# Patient Record
Sex: Female | Born: 1955
Health system: Southern US, Community
[De-identification: ages and names within clinical notes are randomized; demographics above are authoritative.]

## PROBLEM LIST (undated history)

## (undated) DIAGNOSIS — G4730 Sleep apnea, unspecified: Secondary | ICD-10-CM

## (undated) DIAGNOSIS — G4733 Obstructive sleep apnea (adult) (pediatric): Secondary | ICD-10-CM

## (undated) DIAGNOSIS — F419 Anxiety disorder, unspecified: Secondary | ICD-10-CM

## (undated) DIAGNOSIS — Z9889 Other specified postprocedural states: Secondary | ICD-10-CM

## (undated) DIAGNOSIS — R112 Nausea with vomiting, unspecified: Secondary | ICD-10-CM

## (undated) DIAGNOSIS — I639 Cerebral infarction, unspecified: Secondary | ICD-10-CM

## (undated) DIAGNOSIS — F32A Depression, unspecified: Secondary | ICD-10-CM

## (undated) DIAGNOSIS — T7840XA Allergy, unspecified, initial encounter: Secondary | ICD-10-CM

## (undated) DIAGNOSIS — K219 Gastro-esophageal reflux disease without esophagitis: Secondary | ICD-10-CM

## (undated) DIAGNOSIS — R413 Other amnesia: Secondary | ICD-10-CM

## (undated) DIAGNOSIS — R569 Unspecified convulsions: Secondary | ICD-10-CM

## (undated) DIAGNOSIS — Z5189 Encounter for other specified aftercare: Secondary | ICD-10-CM

## (undated) DIAGNOSIS — M1990 Unspecified osteoarthritis, unspecified site: Secondary | ICD-10-CM

## (undated) DIAGNOSIS — E785 Hyperlipidemia, unspecified: Secondary | ICD-10-CM

## (undated) DIAGNOSIS — K759 Inflammatory liver disease, unspecified: Secondary | ICD-10-CM

## (undated) HISTORY — DX: Other amnesia: R41.3

## (undated) HISTORY — DX: Allergy, unspecified, initial encounter: T78.40XA

## (undated) HISTORY — DX: Obstructive sleep apnea (adult) (pediatric): G47.33

## (undated) HISTORY — PX: JOINT REPLACEMENT: SHX530

## (undated) HISTORY — PX: COLONOSCOPY: SHX174

## (undated) HISTORY — PX: BREAST EXCISIONAL BIOPSY: SUR124

## (undated) HISTORY — PX: ARTHROSCOPIC REPAIR ACL: SUR80

## (undated) HISTORY — PX: APPENDECTOMY: SHX54

## (undated) HISTORY — PX: BREAST LUMPECTOMY: SHX2

## (undated) HISTORY — DX: Cerebral infarction, unspecified: I63.9

## (undated) HISTORY — PX: UPPER GASTROINTESTINAL ENDOSCOPY: SHX188

## (undated) HISTORY — DX: Encounter for other specified aftercare: Z51.89

## (undated) HISTORY — DX: Depression, unspecified: F32.A

## (undated) HISTORY — DX: Hyperlipidemia, unspecified: E78.5

## (undated) HISTORY — DX: Sleep apnea, unspecified: G47.30

## (undated) HISTORY — PX: POLYPECTOMY: SHX149

---
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
## 1987-09-09 HISTORY — PX: CHOLECYSTECTOMY: SHX55

## 1999-04-09 HISTORY — PX: ABDOMINAL HYSTERECTOMY: SHX81

## 2005-04-15 ENCOUNTER — Emergency Department: Payer: Self-pay | Admitting: Emergency Medicine

## 2006-01-26 ENCOUNTER — Emergency Department: Payer: Self-pay | Admitting: General Practice

## 2006-01-26 ENCOUNTER — Other Ambulatory Visit: Payer: Self-pay

## 2006-10-09 HISTORY — PX: BREAST EXCISIONAL BIOPSY: SUR124

## 2009-07-08 ENCOUNTER — Ambulatory Visit: Payer: Self-pay | Admitting: Internal Medicine

## 2009-07-13 ENCOUNTER — Ambulatory Visit: Payer: Self-pay | Admitting: Internal Medicine

## 2009-07-23 ENCOUNTER — Ambulatory Visit: Payer: Self-pay | Admitting: Surgery

## 2011-07-26 ENCOUNTER — Ambulatory Visit: Payer: Self-pay | Admitting: Internal Medicine

## 2011-07-26 ENCOUNTER — Ambulatory Visit (INDEPENDENT_AMBULATORY_CARE_PROVIDER_SITE_OTHER): Payer: BC Managed Care – PPO | Admitting: Internal Medicine

## 2011-07-26 ENCOUNTER — Encounter: Payer: Self-pay | Admitting: Internal Medicine

## 2011-07-26 VITALS — BP 132/100 | HR 93 | Temp 98.1°F | Resp 20 | Ht 64.0 in

## 2011-07-26 DIAGNOSIS — F32A Depression, unspecified: Secondary | ICD-10-CM

## 2011-07-26 DIAGNOSIS — F3341 Major depressive disorder, recurrent, in partial remission: Secondary | ICD-10-CM | POA: Insufficient documentation

## 2011-07-26 DIAGNOSIS — M25559 Pain in unspecified hip: Secondary | ICD-10-CM

## 2011-07-26 DIAGNOSIS — F329 Major depressive disorder, single episode, unspecified: Secondary | ICD-10-CM

## 2011-07-26 DIAGNOSIS — M25552 Pain in left hip: Secondary | ICD-10-CM

## 2011-07-26 MED ORDER — TRAMADOL HCL 50 MG PO TABS: 50.0000 mg | ORAL_TABLET | Freq: Four times a day (QID) | ORAL | Status: DC | PRN

## 2011-07-26 MED ORDER — BUPROPION HCL ER (SR) 150 MG PO TB12: 150.0000 mg | ORAL_TABLET | Freq: Two times a day (BID) | ORAL | Status: DC

## 2011-07-26 MED ORDER — HYDROCODONE-ACETAMINOPHEN 5-500 MG PO TABS: 2.0000 | ORAL_TABLET | Freq: Four times a day (QID) | ORAL | Status: DC | PRN

## 2011-07-26 NOTE — Progress Notes (Signed)
  Subjective:    Patient ID: [name], female    DOB: 12-22-1955, 55 y.o.   MRN: [id number]  HPI [name] is a 55 year old female who presents for an acute visit complaining of left hip pain. She reports that the pain started a few weeks ago. It has gotten progressively worse. It is located in the deep within the left hip. It radiates down the front of her left leg. She also occasionally has pain in her left lower back. She denies any weakness in her leg. She denies any numbness. She denies any known injury to her hip. She denies any other arthralgias. She denies any fever or chills.  Outpatient Encounter Prescriptions as of 07/26/2011  Medication Sig Dispense Refill  . buPROPion (WELLBUTRIN SR) 150 MG 12 hr tablet Take 1 tablet (150 mg total) by mouth 2 (two) times daily.  60 tablet  6     Review of Systems  Constitutional: Negative for fever, chills, appetite change, fatigue and unexpected weight change.  Eyes: Negative for visual disturbance.  Respiratory: Negative for shortness of breath.   Cardiovascular: Negative for chest pain.  Gastrointestinal: Positive for vomiting.  Musculoskeletal: Positive for myalgias, arthralgias and gait problem.  Skin: Negative for color change and rash.   BP 132/100  Pulse 93  Temp(Src) 98.1 F (36.7 C) (Oral)  Resp 20  Ht 5\' 4"  (1.626 m)  SpO2 96%     Objective:   Physical Exam  Constitutional: She appears well-developed and well-nourished.  HENT:  Head: Normocephalic and atraumatic.  Right Ear: External ear normal.  Left Ear: External ear normal.  Nose: Nose normal.  Eyes: Conjunctivae and EOM are normal.  Pulmonary/Chest: Effort normal.  Musculoskeletal:       Left hip: She exhibits decreased range of motion and tenderness. She exhibits no swelling, no crepitus and no deformity.          Assessment & Plan:  1. Left hip pain -question arthritis in the left hip. Will start tramadol as needed for pain during the day and Vicodin as  needed at night. We'll set her up for a plain film of her left hip.  Addendum. Left hip plain film shows narrowing of the hip joint consistent with arthritis. I have set patient up with orthopedics this afternoon for further evaluation and possible steroid injection.

## 2011-07-27 ENCOUNTER — Other Ambulatory Visit: Payer: Self-pay | Admitting: Internal Medicine

## 2011-07-27 DIAGNOSIS — F329 Major depressive disorder, single episode, unspecified: Secondary | ICD-10-CM

## 2011-07-27 DIAGNOSIS — F32A Depression, unspecified: Secondary | ICD-10-CM

## 2011-07-28 MED ORDER — BUPROPION HCL ER (SR) 150 MG PO TB12: 150.0000 mg | ORAL_TABLET | Freq: Two times a day (BID) | ORAL | Status: DC

## 2011-08-01 ENCOUNTER — Other Ambulatory Visit: Payer: Self-pay | Admitting: Internal Medicine

## 2011-08-07 NOTE — Telephone Encounter (Signed)
Opened in error

## 2011-08-16 ENCOUNTER — Encounter: Payer: Self-pay | Admitting: Internal Medicine

## 2011-10-23 ENCOUNTER — Telehealth: Payer: Self-pay | Admitting: Internal Medicine

## 2011-10-23 DIAGNOSIS — M25552 Pain in left hip: Secondary | ICD-10-CM

## 2011-10-23 MED ORDER — HYDROCODONE-ACETAMINOPHEN 5-500 MG PO TABS: 2.0000 | ORAL_TABLET | Freq: Four times a day (QID) | ORAL | Status: DC | PRN

## 2011-10-23 NOTE — Telephone Encounter (Signed)
Patient is needing a refill on hydrocodone 5-500 mg. Patient is leaving to go out of town would like a refill today.

## 2011-10-23 NOTE — Telephone Encounter (Signed)
Rx phoned to pharmacy.  

## 2011-10-23 NOTE — Telephone Encounter (Signed)
Fine to refill 

## 2011-12-22 ENCOUNTER — Ambulatory Visit (INDEPENDENT_AMBULATORY_CARE_PROVIDER_SITE_OTHER): Payer: BC Managed Care – PPO | Admitting: Internal Medicine

## 2011-12-22 ENCOUNTER — Encounter: Payer: Self-pay | Admitting: Internal Medicine

## 2011-12-22 VITALS — BP 132/98 | HR 127 | Temp 98.1°F | Ht 64.0 in | Wt 290.0 lb

## 2011-12-22 DIAGNOSIS — R5383 Other fatigue: Secondary | ICD-10-CM

## 2011-12-22 DIAGNOSIS — R110 Nausea: Secondary | ICD-10-CM

## 2011-12-22 DIAGNOSIS — R5381 Other malaise: Secondary | ICD-10-CM

## 2011-12-22 DIAGNOSIS — M1990 Unspecified osteoarthritis, unspecified site: Secondary | ICD-10-CM

## 2011-12-22 LAB — COMPREHENSIVE METABOLIC PANEL
ALT: 23 U/L (ref 0–35)
AST: 22 U/L (ref 0–37)
Alkaline Phosphatase: 83 U/L (ref 39–117)
Sodium: 141 mEq/L (ref 135–145)
Total Bilirubin: 0.7 mg/dL (ref 0.3–1.2)
Total Protein: 7.1 g/dL (ref 6.0–8.3)

## 2011-12-22 LAB — CBC WITH DIFFERENTIAL/PLATELET
Basophils Absolute: 0.1 10*3/uL (ref 0.0–0.1)
HCT: 39.5 % (ref 36.0–46.0)
Hemoglobin: 13.4 g/dL (ref 12.0–15.0)
Lymphs Abs: 2.4 10*3/uL (ref 0.7–4.0)
Monocytes Relative: 6.8 % (ref 3.0–12.0)
Neutro Abs: 5.1 10*3/uL (ref 1.4–7.7)
RDW: 13.2 % (ref 11.5–14.6)

## 2011-12-22 MED ORDER — PANTOPRAZOLE SODIUM 40 MG PO TBEC: 40.0000 mg | DELAYED_RELEASE_TABLET | Freq: Every day | ORAL | Status: DC

## 2011-12-22 MED ORDER — ONDANSETRON 8 MG PO TBDP: 8.0000 mg | ORAL_TABLET | Freq: Three times a day (TID) | ORAL | Status: AC | PRN

## 2011-12-22 NOTE — Assessment & Plan Note (Signed)
Suspect secondary to use of meloxicam. Will try adding proton pump inhibitor to help with symptoms. Followup in one month.

## 2011-12-22 NOTE — Assessment & Plan Note (Signed)
Likely multifactorial. Traveling frequently, with some interrupted sleep.  Symptoms of nausea, epigastric discomfort concerning for gastritis. Will check CBC, CMP, TSH, H. Pylori. Follow up 1 month.

## 2011-12-22 NOTE — Assessment & Plan Note (Addendum)
Severe. Bilateral hips. Patient was seen by orthopedic physician who reportedly told her that she will ultimately need hip replacement. She reports significant improvement in her pain with meloxicam. She has had some nausea and stomach upset with this medication. Will try adding proton pump inhibitor to help with symptoms. We discussed that if symptoms persist she will need to stop this medication. She will e-mail or call with update. Follow up 1 month.

## 2011-12-22 NOTE — Progress Notes (Signed)
Subjective:    Patient ID: [name], female    DOB: 03-Dec-1955, 56 y.o.   MRN: [id number]  HPI 56 year old female with history of osteoarthritis of her hips presents for followup. She is concerned today about 1-2 week history of mild epigastric discomfort and nausea. She first developed these symptoms after taking meloxicam. She denies any vomiting. She denies any change in her stools, black stool, blood in her stool. She reports significant improvement in her arthritis pain with the use of meloxicam. She does not want to stop this medication.  Outpatient Encounter Prescriptions as of 12/22/2011  Medication Sig Dispense Refill  . buPROPion (WELLBUTRIN SR) 150 MG 12 hr tablet Take 1 tablet (150 mg total) by mouth 2 (two) times daily.  60 tablet  6  . HYDROcodone-acetaminophen (VICODIN) 5-500 MG per tablet Take 2 tablets by mouth every 6 (six) hours as needed for pain.  30 tablet  3  . meloxicam (MOBIC) 15 MG tablet Take 15 mg by mouth daily.      . ondansetron (ZOFRAN-ODT) 8 MG disintegrating tablet Take 1 tablet (8 mg total) by mouth every 8 (eight) hours as needed for nausea.  20 tablet  0  . pantoprazole (PROTONIX) 40 MG tablet Take 1 tablet (40 mg total) by mouth daily.  30 tablet  3  . traMADol (ULTRAM) 50 MG tablet Take 1 tablet (50 mg total) by mouth every 6 (six) hours as needed for pain.  60 tablet  3    Review of Systems  Constitutional: Negative for fever, chills, appetite change, fatigue and unexpected weight change.  HENT: Negative for ear pain, congestion, sore throat, trouble swallowing, neck pain, voice change and sinus pressure.   Eyes: Negative for visual disturbance.  Respiratory: Negative for cough, shortness of breath, wheezing and stridor.   Cardiovascular: Negative for chest pain, palpitations and leg swelling.  Gastrointestinal: Positive for nausea and abdominal pain. Negative for vomiting, diarrhea, constipation, blood in stool, abdominal distention and anal bleeding.   Genitourinary: Negative for dysuria and flank pain.  Musculoskeletal: Positive for myalgias and arthralgias. Negative for gait problem.  Skin: Negative for color change and rash.  Neurological: Negative for dizziness and headaches.  Hematological: Negative for adenopathy. Does not bruise/bleed easily.  Psychiatric/Behavioral: Negative for suicidal ideas, sleep disturbance and dysphoric mood. The patient is not nervous/anxious.    BP 132/98  Pulse 127  Temp(Src) 98.1 F (36.7 C) (Oral)  Ht 5\' 4"  (1.626 m)  Wt 290 lb (131.543 kg)  BMI 49.78 kg/m2  SpO2 97%     Objective:   Physical Exam  Constitutional: She is oriented to person, place, and time. She appears well-developed and well-nourished. No distress.  HENT:  Head: Normocephalic and atraumatic.  Right Ear: External ear normal.  Left Ear: External ear normal.  Nose: Nose normal.  Mouth/Throat: Oropharynx is clear and moist. No oropharyngeal exudate.  Eyes: Conjunctivae are normal. Pupils are equal, round, and reactive to light. Right eye exhibits no discharge. Left eye exhibits no discharge. No scleral icterus.  Neck: Normal range of motion. Neck supple. No tracheal deviation present. No thyromegaly present.  Cardiovascular: Normal rate, regular rhythm, normal heart sounds and intact distal pulses.  Exam reveals no gallop and no friction rub.   No murmur heard. Pulmonary/Chest: Effort normal and breath sounds normal. No respiratory distress. She has no wheezes. She has no rales. She exhibits no tenderness.  Abdominal: Soft. She exhibits no distension and no mass. There is no tenderness. There is  no rebound and no guarding.  Musculoskeletal: Normal range of motion. She exhibits no edema and no tenderness.  Lymphadenopathy:    She has no cervical adenopathy.  Neurological: She is alert and oriented to person, place, and time. No cranial nerve deficit. She exhibits normal muscle tone. Coordination normal.  Skin: Skin is warm and  dry. No rash noted. She is not diaphoretic. No erythema. No pallor.  Psychiatric: She has a normal mood and affect. Her behavior is normal. Judgment and thought content normal.          Assessment & Plan:

## 2012-01-26 ENCOUNTER — Ambulatory Visit: Payer: BC Managed Care – PPO | Admitting: Internal Medicine

## 2012-01-26 DIAGNOSIS — Z0289 Encounter for other administrative examinations: Secondary | ICD-10-CM

## 2012-01-27 ENCOUNTER — Encounter: Payer: Self-pay | Admitting: Internal Medicine

## 2012-01-27 DIAGNOSIS — M25552 Pain in left hip: Secondary | ICD-10-CM

## 2012-01-29 MED ORDER — HYDROCODONE-ACETAMINOPHEN 5-500 MG PO TABS: 2.0000 | ORAL_TABLET | Freq: Four times a day (QID) | ORAL | Status: DC | PRN

## 2012-03-11 ENCOUNTER — Other Ambulatory Visit: Payer: Self-pay | Admitting: Internal Medicine

## 2012-04-30 ENCOUNTER — Encounter: Payer: Self-pay | Admitting: Internal Medicine

## 2012-05-01 ENCOUNTER — Other Ambulatory Visit: Payer: Self-pay | Admitting: *Deleted

## 2012-05-01 DIAGNOSIS — M25552 Pain in left hip: Secondary | ICD-10-CM

## 2012-05-02 MED ORDER — HYDROCODONE-ACETAMINOPHEN 5-500 MG PO TABS: 2.0000 | ORAL_TABLET | Freq: Four times a day (QID) | ORAL | Status: DC | PRN

## 2012-05-03 NOTE — Telephone Encounter (Signed)
rX CALLED IN

## 2012-05-13 ENCOUNTER — Other Ambulatory Visit: Payer: Self-pay | Admitting: *Deleted

## 2012-05-13 NOTE — Telephone Encounter (Signed)
Opened in error

## 2012-05-14 ENCOUNTER — Other Ambulatory Visit: Payer: Self-pay | Admitting: *Deleted

## 2012-05-14 DIAGNOSIS — R110 Nausea: Secondary | ICD-10-CM

## 2012-05-14 MED ORDER — PANTOPRAZOLE SODIUM 40 MG PO TBEC: 40.0000 mg | DELAYED_RELEASE_TABLET | Freq: Every day | ORAL | Status: DC

## 2012-05-28 ENCOUNTER — Other Ambulatory Visit: Payer: Self-pay | Admitting: *Deleted

## 2012-05-28 DIAGNOSIS — R110 Nausea: Secondary | ICD-10-CM

## 2012-05-28 MED ORDER — PANTOPRAZOLE SODIUM 40 MG PO TBEC: 40.0000 mg | DELAYED_RELEASE_TABLET | Freq: Every day | ORAL | Status: DC

## 2012-05-31 ENCOUNTER — Other Ambulatory Visit: Payer: Self-pay | Admitting: Internal Medicine

## 2012-06-20 ENCOUNTER — Ambulatory Visit (INDEPENDENT_AMBULATORY_CARE_PROVIDER_SITE_OTHER): Payer: BC Managed Care – PPO | Admitting: Internal Medicine

## 2012-06-20 ENCOUNTER — Encounter: Payer: Self-pay | Admitting: Internal Medicine

## 2012-06-20 VITALS — BP 142/90 | HR 100 | Temp 98.4°F | Ht 64.0 in | Wt 289.5 lb

## 2012-06-20 DIAGNOSIS — F3289 Other specified depressive episodes: Secondary | ICD-10-CM

## 2012-06-20 DIAGNOSIS — M169 Osteoarthritis of hip, unspecified: Secondary | ICD-10-CM

## 2012-06-20 DIAGNOSIS — Z23 Encounter for immunization: Secondary | ICD-10-CM

## 2012-06-20 DIAGNOSIS — F329 Major depressive disorder, single episode, unspecified: Secondary | ICD-10-CM

## 2012-06-20 DIAGNOSIS — F32A Depression, unspecified: Secondary | ICD-10-CM

## 2012-06-20 DIAGNOSIS — M1612 Unilateral primary osteoarthritis, left hip: Secondary | ICD-10-CM | POA: Insufficient documentation

## 2012-06-20 DIAGNOSIS — M25552 Pain in left hip: Secondary | ICD-10-CM

## 2012-06-20 DIAGNOSIS — M1610 Unilateral primary osteoarthritis, unspecified hip: Secondary | ICD-10-CM

## 2012-06-20 DIAGNOSIS — M25559 Pain in unspecified hip: Secondary | ICD-10-CM

## 2012-06-20 MED ORDER — BUPROPION HCL ER (SR) 200 MG PO TB12: 200.0000 mg | ORAL_TABLET | Freq: Two times a day (BID) | ORAL | Status: DC

## 2012-06-20 MED ORDER — MELOXICAM 15 MG PO TABS: 15.0000 mg | ORAL_TABLET | Freq: Every day | ORAL | Status: DC

## 2012-06-20 MED ORDER — TRAMADOL HCL 50 MG PO TABS: 100.0000 mg | ORAL_TABLET | Freq: Three times a day (TID) | ORAL | Status: DC | PRN

## 2012-06-20 MED ORDER — HYDROCODONE-ACETAMINOPHEN 5-500 MG PO TABS: 2.0000 | ORAL_TABLET | Freq: Four times a day (QID) | ORAL | Status: DC | PRN

## 2012-06-20 NOTE — Progress Notes (Signed)
Subjective:    Patient ID: [name], female    DOB: Aug 20, 1956, 56 y.o.   MRN: [id number]  HPI 56YO female presents for acute visit c/o 1 year history of gradually worsening left hip pain which radiates down her left anterior thigh.  Pain started fairly suddenly last September. No known trauma or injury to left hip.  Imaging at that time showed arthritic changes but no acute fracture or other process.  Pt was seen by local ortho, started on Meloxicam. Minimal improvement with this. Now using meloxicam, tramadol, and hydrocodone at night without improvement. Unable to sleep through night because of pain. Gait is unstable with some weakness in left leg and pain in left knee over last few weeks. Pt notes she has been more sedentary because of pain.   Outpatient Encounter Prescriptions as of 06/20/2012  Medication Sig Dispense Refill  . buPROPion (WELLBUTRIN SR) 200 MG 12 hr tablet Take 1 tablet (200 mg total) by mouth 2 (two) times daily.  60 tablet  6  . HYDROcodone-acetaminophen (VICODIN) 5-500 MG per tablet Take 2 tablets by mouth every 6 (six) hours as needed for pain.  30 tablet  3  . pantoprazole (PROTONIX) 40 MG tablet Take 1 tablet (40 mg total) by mouth daily.  30 tablet  6  . traMADol (ULTRAM) 50 MG tablet Take 2 tablets (100 mg total) by mouth every 8 (eight) hours as needed for pain.  90 tablet  0  . meloxicam (MOBIC) 15 MG tablet Take 1 tablet (15 mg total) by mouth daily.  30 tablet  6   BP 142/90  Pulse 100  Temp 98.4 F (36.9 C) (Oral)  Ht 5\' 4"  (1.626 m)  Wt 289 lb 8 oz (131.316 kg)  BMI 49.69 kg/m2  SpO2 96%  Review of Systems  Constitutional: Negative for fever, chills, appetite change, fatigue and unexpected weight change.  HENT: Negative for ear pain, congestion, sore throat, trouble swallowing, neck pain, voice change and sinus pressure.   Eyes: Negative for visual disturbance.  Respiratory: Negative for cough, shortness of breath, wheezing and stridor.     Cardiovascular: Negative for chest pain, palpitations and leg swelling.  Gastrointestinal: Negative for nausea, vomiting, abdominal pain, diarrhea, constipation, blood in stool, abdominal distention and anal bleeding.  Genitourinary: Negative for dysuria and flank pain.  Musculoskeletal: Positive for myalgias, arthralgias and gait problem.  Skin: Negative for color change and rash.  Neurological: Negative for dizziness and headaches.  Hematological: Negative for adenopathy. Does not bruise/bleed easily.  Psychiatric/Behavioral: Positive for disturbed wake/sleep cycle. Negative for suicidal ideas and dysphoric mood. The patient is nervous/anxious.        Objective:   Physical Exam  Constitutional: She is oriented to person, place, and time. She appears well-developed and well-nourished. No distress.  HENT:  Head: Normocephalic and atraumatic.  Right Ear: External ear normal.  Left Ear: External ear normal.  Nose: Nose normal.  Mouth/Throat: Oropharynx is clear and moist. No oropharyngeal exudate.  Eyes: Conjunctivae normal are normal. Pupils are equal, round, and reactive to light. Right eye exhibits no discharge. Left eye exhibits no discharge. No scleral icterus.  Neck: Normal range of motion. Neck supple. No tracheal deviation present. No thyromegaly present.  Cardiovascular: Normal rate, regular rhythm, normal heart sounds and intact distal pulses.  Exam reveals no gallop and no friction rub.   No murmur heard. Pulmonary/Chest: Effort normal and breath sounds normal. No respiratory distress. She has no wheezes. She has no rales. She exhibits  no tenderness.  Musculoskeletal: She exhibits no edema and no tenderness.       Left hip: She exhibits decreased range of motion.       Right knee: She exhibits normal range of motion, no swelling and no effusion.  Lymphadenopathy:    She has no cervical adenopathy.  Neurological: She is alert and oriented to person, place, and time. No cranial  nerve deficit. She exhibits normal muscle tone. Coordination normal.  Skin: Skin is warm and dry. No rash noted. She is not diaphoretic. No erythema. No pallor.  Psychiatric: She has a normal mood and affect. Her behavior is normal. Judgment and thought content normal.          Assessment & Plan:

## 2012-06-20 NOTE — Assessment & Plan Note (Signed)
Symptoms of anxiety worsening likely because of limited sleep and chronic pain. Will increase wellbutrin to 200mg  po bid. Follow up 6 weeks and prn.

## 2012-06-20 NOTE — Assessment & Plan Note (Signed)
Severe pain left hip, now limiting ability to perform ADLs. Minimal improvement with Meloxicam, Tramadol and Hydrodocone at night. Plain film from 06/2011 showed arthritic changes. Pt was referred to ortho, Dr. Hyacinth Meeker, who started meloxicam, and told pt she would likely need hip replacement at some point. No follow up was scheduled. Given progression of symptoms, suspect she may need repeat imaging for evaluation. Will set up ortho evaluation.

## 2012-06-21 ENCOUNTER — Encounter: Payer: Self-pay | Admitting: Internal Medicine

## 2012-06-21 DIAGNOSIS — E669 Obesity, unspecified: Secondary | ICD-10-CM

## 2012-06-21 DIAGNOSIS — R110 Nausea: Secondary | ICD-10-CM

## 2012-06-21 MED ORDER — PANTOPRAZOLE SODIUM 40 MG PO TBEC: 40.0000 mg | DELAYED_RELEASE_TABLET | Freq: Every day | ORAL | Status: DC

## 2012-07-05 ENCOUNTER — Encounter: Payer: Self-pay | Admitting: Internal Medicine

## 2012-07-05 ENCOUNTER — Telehealth: Payer: Self-pay | Admitting: Internal Medicine

## 2012-07-05 NOTE — Telephone Encounter (Signed)
Insurance note sent regarding overuse of narcotics

## 2012-07-17 ENCOUNTER — Encounter: Payer: Self-pay | Admitting: Internal Medicine

## 2012-07-17 ENCOUNTER — Telehealth: Payer: Self-pay | Admitting: Internal Medicine

## 2012-07-17 NOTE — Telephone Encounter (Signed)
[facility] surgery form in box

## 2012-07-18 ENCOUNTER — Encounter: Payer: Self-pay | Admitting: Internal Medicine

## 2012-07-30 ENCOUNTER — Encounter: Payer: Self-pay | Admitting: Internal Medicine

## 2012-07-30 ENCOUNTER — Other Ambulatory Visit: Payer: Self-pay | Admitting: Internal Medicine

## 2012-07-30 DIAGNOSIS — R110 Nausea: Secondary | ICD-10-CM

## 2012-07-30 DIAGNOSIS — M1612 Unilateral primary osteoarthritis, left hip: Secondary | ICD-10-CM

## 2012-07-30 MED ORDER — HYDROCODONE-ACETAMINOPHEN 5-500 MG PO TABS: 2.0000 | ORAL_TABLET | Freq: Three times a day (TID) | ORAL | Status: DC | PRN

## 2012-07-30 MED ORDER — TRAMADOL HCL 50 MG PO TABS: 100.0000 mg | ORAL_TABLET | Freq: Three times a day (TID) | ORAL | Status: DC | PRN

## 2012-07-30 MED ORDER — PANTOPRAZOLE SODIUM 40 MG PO TBEC: 40.0000 mg | DELAYED_RELEASE_TABLET | Freq: Every day | ORAL | Status: DC

## 2012-07-31 NOTE — Telephone Encounter (Signed)
Rx called to [facility] pharmacy.

## 2012-08-02 ENCOUNTER — Other Ambulatory Visit (INDEPENDENT_AMBULATORY_CARE_PROVIDER_SITE_OTHER): Payer: Self-pay | Admitting: General Surgery

## 2012-08-02 ENCOUNTER — Ambulatory Visit (INDEPENDENT_AMBULATORY_CARE_PROVIDER_SITE_OTHER): Payer: BC Managed Care – PPO | Admitting: General Surgery

## 2012-08-02 ENCOUNTER — Encounter (INDEPENDENT_AMBULATORY_CARE_PROVIDER_SITE_OTHER): Payer: Self-pay | Admitting: General Surgery

## 2012-08-02 VITALS — BP 136/80 | HR 98 | Temp 99.4°F | Resp 18 | Ht 64.0 in | Wt 285.8 lb

## 2012-08-02 DIAGNOSIS — Z6841 Body Mass Index (BMI) 40.0 and over, adult: Secondary | ICD-10-CM

## 2012-08-02 NOTE — Progress Notes (Signed)
Patient ID: [name], female   DOB: [birth year], 56 y.o.   MRN: [id number]  Chief Complaint  Patient presents with  . Bariatric Pre-op    Sleeve    HPI [name] is a 56 y.o. female. This patient presents for her initial weight loss surgery consultation. She has attended our information session and is interested in the vertical sleeve gastrectomy. She has a BMI of 49 with comorbidities of arthritis and depression. She has trouble with her weight most of her adult life and has tried several diets including Adkins, [employer] Watchers, medifast. She was most effective with the medifast diet where she lost 90 pounds but regained most of the week. She came to surgery because of her hip arthritis. She said that her surgeon will not offer her surgery to fix her arthritic hip until she loses the weight. She tells me that she is "tired of feeling bad" she is interested in a sleeve gastrectomy and denies any reflux. She does take Protonix daily because of the irritation of her stomach from her chronic anti-inflammatories. HPI  No past medical history on file.  Past Surgical History  Procedure Date  . Abdominal hysterectomy   . Breast lumpectomy     left  . Cholecystectomy 1988  . Arthroscopic repair acl     bilateral knees    Family History  Problem Relation Age of Onset  . Cancer Maternal Aunt     ovarian cancer    Social History History  Substance Use Topics  . Smoking status: Former Smoker    Quit date: 07/25/2001  . Smokeless tobacco: Never Used  . Alcohol Use: Yes     rarely    No Known Allergies  Current Outpatient Prescriptions  Medication Sig Dispense Refill  . buPROPion (WELLBUTRIN SR) 200 MG 12 hr tablet Take 1 tablet (200 mg total) by mouth 2 (two) times daily.  60 tablet  6  . HYDROcodone-acetaminophen (VICODIN) 5-500 MG per tablet TAKE 2 TABLETS EVERY 6 HOURS AS NEEDED FOR PAIN  30 tablet  3  . meloxicam (MOBIC) 15 MG tablet Take 1 tablet (15 mg total) by mouth daily.   30 tablet  6  . pantoprazole (PROTONIX) 40 MG tablet Take 1 tablet (40 mg total) by mouth daily.  30 tablet  6  . traMADol (ULTRAM) 50 MG tablet Take 2 tablets (100 mg total) by mouth every 8 (eight) hours as needed for pain.  90 tablet  3    Review of Systems Review of Systems All other review of systems negative or noncontributory except as stated in the HPI  Blood pressure 136/80, pulse 98, temperature 99.4 F (37.4 C), temperature source Oral, resp. rate 18, height 5\' 4"  (1.626 m), weight 285 lb 12.8 oz (129.638 kg).  Physical Exam Physical Exam Physical Exam  Nursing note and vitals reviewed. Constitutional: She is oriented to person, place, and time. She appears well-developed and well-nourished. No distress.  HENT:  Head: Normocephalic and atraumatic.  Mouth/Throat: No oropharyngeal exudate.  Eyes: Conjunctivae and EOM are normal. Pupils are equal, round, and reactive to light. Right eye exhibits no discharge. Left eye exhibits no discharge. No scleral icterus.  Neck: Normal range of motion. Neck supple. No tracheal deviation present.  Cardiovascular: Normal rate, regular rhythm, normal heart sounds and intact distal pulses.   Pulmonary/Chest: Effort normal and breath sounds normal. No stridor. No respiratory distress. She has no wheezes.  Abdominal: Soft. Bowel sounds are normal. She exhibits no distension and no  mass. There is no tenderness. There is no rebound and no guarding.  Musculoskeletal: Normal range of motion. She exhibits no edema and no tenderness.  Neurological: She is alert and oriented to person, place, and time.  Skin: Skin is warm and dry. No rash noted. She is not diaphoretic. No erythema. No pallor.  Psychiatric: She has a normal mood and affect. Her behavior is normal. Judgment and thought content normal.    Data Reviewed   Assessment    Morbid obesity with a BMI of 49 and comorbidities of arthritis and depression We had a long discussion regarding  other medical and surgical weight loss options. We discussed the lap band, a sleeve gastrectomy, and the Roux-en-Y gastric bypass and I think that she be a fine candidate for any of these procedures. She is most interested in a vertical sleeve gastrectomy and I think that she would be fine to proceed with this. She does not have reflux although she does take Protonix due to her chronic anti-inflammatory use. We discussed the procedures and their risks and benefits. The risks of infection, bleeding, pain, scarring, weight regain, too little or too much weight loss, vitamin deficiencies and need for lifelong vitamin supplementation, hair loss, need for protein supplementation, leaks, stricture, reflux, food intolerance, need for reoperation and conversion to roux Y gastric bypass, need for open surgery, injury to spleen or surrounding structures, DVT's, PE, and death discussed with the patient and the patient expressed understanding and desires to proceed with laparoscopic vertical sleeve gastrectomy, possible open, intraoperative endoscopy.     Plan    We will go ahead and set her up for nutrition labs, upper GI, nutrition in psychology evaluation. I recommended that she start finding a low impact exercises she can get accustomed to and help her to be successful after her procedure.       Lodema Pilot DAVID 08/02/2012, 12:01 PM

## 2012-08-08 ENCOUNTER — Encounter: Payer: Self-pay | Admitting: *Deleted

## 2012-08-08 ENCOUNTER — Ambulatory Visit (HOSPITAL_COMMUNITY)
Admission: RE | Admit: 2012-08-08 | Discharge: 2012-08-08 | Disposition: A | Discharge status: Home / Self Care | Payer: BC Managed Care – PPO | Source: Ambulatory Visit | Attending: General Surgery | Admitting: General Surgery

## 2012-08-08 ENCOUNTER — Encounter: Payer: BC Managed Care – PPO | Attending: General Surgery | Admitting: *Deleted

## 2012-08-08 DIAGNOSIS — F329 Major depressive disorder, single episode, unspecified: Secondary | ICD-10-CM | POA: Insufficient documentation

## 2012-08-08 DIAGNOSIS — Z01818 Encounter for other preprocedural examination: Secondary | ICD-10-CM | POA: Insufficient documentation

## 2012-08-08 DIAGNOSIS — Z6841 Body Mass Index (BMI) 40.0 and over, adult: Secondary | ICD-10-CM | POA: Insufficient documentation

## 2012-08-08 DIAGNOSIS — F3289 Other specified depressive episodes: Secondary | ICD-10-CM | POA: Insufficient documentation

## 2012-08-08 DIAGNOSIS — K219 Gastro-esophageal reflux disease without esophagitis: Secondary | ICD-10-CM | POA: Insufficient documentation

## 2012-08-08 DIAGNOSIS — Z713 Dietary counseling and surveillance: Secondary | ICD-10-CM | POA: Insufficient documentation

## 2012-08-08 DIAGNOSIS — K449 Diaphragmatic hernia without obstruction or gangrene: Secondary | ICD-10-CM | POA: Insufficient documentation

## 2012-08-08 DIAGNOSIS — M1610 Unilateral primary osteoarthritis, unspecified hip: Secondary | ICD-10-CM | POA: Insufficient documentation

## 2012-08-08 LAB — COMPREHENSIVE METABOLIC PANEL
Alkaline Phosphatase: 88 U/L (ref 39–117)
BUN: 17 mg/dL (ref 6–23)
Creat: 1.01 mg/dL (ref 0.50–1.10)
Glucose, Bld: 91 mg/dL (ref 70–99)
Sodium: 140 mEq/L (ref 135–145)
Total Bilirubin: 0.6 mg/dL (ref 0.3–1.2)

## 2012-08-08 LAB — CBC
HCT: 39.9 % (ref 36.0–46.0)
Hemoglobin: 13.9 g/dL (ref 12.0–15.0)
MCH: 32.1 pg (ref 26.0–34.0)
MCHC: 34.8 g/dL (ref 30.0–36.0)
RBC: 4.33 MIL/uL (ref 3.87–5.11)

## 2012-08-08 LAB — LIPID PANEL
Cholesterol: 259 mg/dL — ABNORMAL HIGH (ref 0–200)
HDL: 47 mg/dL (ref >39)
Triglycerides: 161 mg/dL — ABNORMAL HIGH (ref <150)

## 2012-08-08 LAB — T4: T4, Total: 8.6 ug/dL (ref 5.0–12.5)

## 2012-08-08 NOTE — Patient Instructions (Addendum)
   Follow Pre-Op Nutrition Goals to prepare for Gastric Sleeve Surgery.   Call the [facility] at [phone] once you have been given your surgery date to enrolled in the Pre-Op Nutrition Class. You will need to attend this nutrition class 3-4 weeks prior to your surgery.  

## 2012-08-09 LAB — H. PYLORI ANTIBODY, IGG: H Pylori IgG: <0.4 {ISR}

## 2012-08-10 ENCOUNTER — Encounter: Payer: Self-pay | Admitting: *Deleted

## 2012-08-10 NOTE — Progress Notes (Signed)
  Pre-Op Assessment Visit:  Pre-Operative Gastric Sleeve Surgery  Medical Nutrition Therapy:  Appt start time: 1200   End time:  1300.  Patient was seen on 08/08/12 for Pre-Operative Gastric Sleeve Nutrition Assessment. Assessment and letter of approval faxed to [facility] Bariatric Surgery Program coordinator on 08/10/12.  Approval letter sent to [facility] center and will be available in the chart under the media tab.  Handouts given during visit include:  Pre-Op Goals   Bariatric Surgery Protein Shakes handout  Gastric Sleeve Patient Handbook  Patient to call for Pre-Op and Post-Op Nutrition Education at the [facility] when surgery is scheduled.

## 2012-08-27 ENCOUNTER — Other Ambulatory Visit (INDEPENDENT_AMBULATORY_CARE_PROVIDER_SITE_OTHER): Payer: Self-pay | Admitting: General Surgery

## 2012-09-03 ENCOUNTER — Other Ambulatory Visit: Payer: Self-pay | Admitting: Internal Medicine

## 2012-09-03 NOTE — Telephone Encounter (Signed)
Can you also let pt know that we identified an orthopedic surgeon who would likely consider her case, prior to gastric bypass or sleeve procedure if she is interested. We have the card for the surgeon at the desk. Fine to refill Vicodin.

## 2012-09-12 ENCOUNTER — Encounter: Payer: BC Managed Care – PPO | Attending: General Surgery | Admitting: *Deleted

## 2012-09-12 VITALS — Ht 64.0 in | Wt 282.2 lb

## 2012-09-12 DIAGNOSIS — Z713 Dietary counseling and surveillance: Secondary | ICD-10-CM | POA: Insufficient documentation

## 2012-09-12 DIAGNOSIS — Z01818 Encounter for other preprocedural examination: Secondary | ICD-10-CM | POA: Insufficient documentation

## 2012-09-12 NOTE — Progress Notes (Signed)
Bariatric Class:  Appt start time: 1730 end time:  1830.  Pre-Operative Nutrition Class  Patient was seen on 09/12/12 for Pre-Operative Bariatric Surgery Education at the [facility].   Surgery date: 09/30/12 Surgery type: Gastric Sleeve Start weight at [facility]: 281.8 lbs (08/08/12)  Weight today: 282.2 lbs BMI:  48.4  Samples given per MNT protocol: Bariatric Advantage Multivitamin Lot # [id number];  Exp: 06/15  Bariatric Advantage Calcium Citrate Lot # [id number];  Exp: 12/13 (*Pt alerted to upcoming expiration date)  Celebrate Vitamins Multivitamin Lot # [id number];  Exp: 07/14  Celebrate Vitamins Iron + C (18 mg) Lot # [id number];  Exp: 03/15  Celebrate Vitamins Calcium Citrate Lot # [id number];  Exp: 08/15  Corliss Marcus Protein Shake Lot # [id number];  Exp: 02/15  Premier Protein Shake Lot # [id number];  Exp: 07/27/13  The following the learning objective met by the patient during this course:  Identifies Pre-Op Dietary Goals and will begin 2 weeks pre-operatively  Identifies appropriate sources of fluids and proteins   States protein recommendations and appropriate sources pre and post-operatively  Identifies Post-Operative Dietary Goals and will follow for 2 weeks post-operatively  Identifies appropriate multivitamin and calcium sources  Describes the need for physical activity post-operatively and will follow MD recommendations  States when to call healthcare provider regarding medication questions or post-operative complications  Handouts given during class include:  Pre-Op Bariatric Surgery Diet Handout  Protein Shake Handout  Post-Op Bariatric Surgery Nutrition Handout  [facility] Program Information Flyer  Support Group Information Flyer  [facility] Bariatric Supplements Price List  Follow-Up Plan: Patient will follow-up at [facility] 2 weeks post operatively for diet advancement per MD.

## 2012-09-15 NOTE — Patient Instructions (Addendum)
Follow:   Pre-Op Diet per MD 2 weeks prior to surgery  Phase 2- Liquids (clear/full) 2 weeks after surgery  Vitamin/Mineral/Calcium guidelines for purchasing bariatric supplements  Exercise guidelines pre and post-op per MD  Follow-up at [facility] in 2 weeks post-op for diet advancement. Contact Miguel Christiana as needed with questions/concerns. 

## 2012-09-23 ENCOUNTER — Ambulatory Visit (INDEPENDENT_AMBULATORY_CARE_PROVIDER_SITE_OTHER): Payer: BC Managed Care – PPO | Admitting: Internal Medicine

## 2012-09-23 ENCOUNTER — Encounter: Payer: Self-pay | Admitting: Internal Medicine

## 2012-09-23 ENCOUNTER — Encounter (HOSPITAL_COMMUNITY): Payer: Self-pay | Admitting: Pharmacy Technician

## 2012-09-23 VITALS — BP 130/86 | HR 107 | Temp 98.0°F | Resp 16 | Wt 273.8 lb

## 2012-09-23 DIAGNOSIS — M1612 Unilateral primary osteoarthritis, left hip: Secondary | ICD-10-CM

## 2012-09-23 DIAGNOSIS — M1610 Unilateral primary osteoarthritis, unspecified hip: Secondary | ICD-10-CM

## 2012-09-23 DIAGNOSIS — Z6841 Body Mass Index (BMI) 40.0 and over, adult: Secondary | ICD-10-CM

## 2012-09-23 DIAGNOSIS — F32A Depression, unspecified: Secondary | ICD-10-CM

## 2012-09-23 DIAGNOSIS — F329 Major depressive disorder, single episode, unspecified: Secondary | ICD-10-CM

## 2012-09-23 DIAGNOSIS — M169 Osteoarthritis of hip, unspecified: Secondary | ICD-10-CM

## 2012-09-23 DIAGNOSIS — F3289 Other specified depressive episodes: Secondary | ICD-10-CM

## 2012-09-23 DIAGNOSIS — E669 Obesity, unspecified: Secondary | ICD-10-CM | POA: Insufficient documentation

## 2012-09-23 NOTE — Assessment & Plan Note (Signed)
Wt Readings from Last 3 Encounters:  09/23/12 273 lb 12 oz (124.172 kg)  09/12/12 282 lb 3.2 oz (128.005 kg)  08/08/12 281 lb 12.8 oz (127.824 kg)   Congratulated patient on 10 pound weight loss. Encouraged her to continue efforts at healthy diet as she prepares for bariatric surgery scheduled for next week. Followup in one month.

## 2012-09-23 NOTE — Progress Notes (Signed)
Subjective:    Patient ID: [name], female    DOB: 10-Sep-1956, 56 y.o.   MRN: [id number]  HPI 56 year old female with history of obesity, depression, and osteoarthritis of the left hip presents for followup. She is scheduled for gastric sleeve procedure next week. In preparation for this surgery, she has been following a healthy diet and has lost 10 pounds. She is looking forward to upcoming surgery and weight loss. After bariatric surgery, she plans to have left hip replaced in February 2014. She continues to have severe pain in her left hip, which is worsened with movement or weightbearing. She is currently taking meloxicam and hydrocodone. She has some improvement with this.  In regards to history of depression, she reports symptoms are well controlled on Wellbutrin. She notes that her mother recently passed away. She feels that she is dealing with this well. She has not sought counseling for this. However, she has strong support from her family.  Outpatient Encounter Prescriptions as of 09/23/2012  Medication Sig Dispense Refill  . buPROPion (WELLBUTRIN SR) 200 MG 12 hr tablet Take 1 tablet (200 mg total) by mouth 2 (two) times daily.  60 tablet  6  . HYDROcodone-acetaminophen (VICODIN) 5-500 MG per tablet TAKE 2 TABLETS BY MOUTH EVERY 6 HOURS AS NEEDED FOR PAIN  30 tablet  3  . traMADol (ULTRAM) 50 MG tablet Take 2 tablets (100 mg total) by mouth every 8 (eight) hours as needed for pain.  90 tablet  3  . [DISCONTINUED] meloxicam (MOBIC) 15 MG tablet Take 1 tablet (15 mg total) by mouth daily.  30 tablet  6  . [DISCONTINUED] pantoprazole (PROTONIX) 40 MG tablet Take 1 tablet (40 mg total) by mouth daily.  30 tablet  6   BP 130/86  Pulse 107  Temp 98 F (36.7 C) (Oral)  Resp 16  Wt 273 lb 12 oz (124.172 kg)  SpO2 94%  Review of Systems  Constitutional: Negative for fever, chills, appetite change, fatigue and unexpected weight change.  HENT: Negative for ear pain, congestion, sore  throat, trouble swallowing, neck pain, voice change and sinus pressure.   Eyes: Negative for visual disturbance.  Respiratory: Negative for cough, shortness of breath, wheezing and stridor.   Cardiovascular: Negative for chest pain, palpitations and leg swelling.  Gastrointestinal: Negative for nausea, vomiting, abdominal pain, diarrhea, constipation, blood in stool, abdominal distention and anal bleeding.  Genitourinary: Negative for dysuria and flank pain.  Musculoskeletal: Positive for myalgias, arthralgias and gait problem.  Skin: Negative for color change and rash.  Neurological: Negative for dizziness and headaches.  Hematological: Negative for adenopathy. Does not bruise/bleed easily.  Psychiatric/Behavioral: Positive for dysphoric mood. Negative for suicidal ideas and sleep disturbance. The patient is not nervous/anxious.        Objective:   Physical Exam  Constitutional: She is oriented to person, place, and time. She appears well-developed and well-nourished. No distress.  HENT:  Head: Normocephalic and atraumatic.  Right Ear: External ear normal.  Left Ear: External ear normal.  Nose: Nose normal.  Mouth/Throat: Oropharynx is clear and moist. No oropharyngeal exudate.  Eyes: Conjunctivae normal are normal. Pupils are equal, round, and reactive to light. Right eye exhibits no discharge. Left eye exhibits no discharge. No scleral icterus.  Neck: Normal range of motion. Neck supple. No tracheal deviation present. No thyromegaly present.  Cardiovascular: Normal rate, regular rhythm, normal heart sounds and intact distal pulses.  Exam reveals no gallop and no friction rub.   No murmur  heard. Pulmonary/Chest: Effort normal and breath sounds normal. No respiratory distress. She has no wheezes. She has no rales. She exhibits no tenderness.  Musculoskeletal: She exhibits no edema and no tenderness.       Left hip: She exhibits decreased range of motion and bony tenderness.   Lymphadenopathy:    She has no cervical adenopathy.  Neurological: She is alert and oriented to person, place, and time. No cranial nerve deficit. She exhibits normal muscle tone. Coordination normal.  Skin: Skin is warm and dry. No rash noted. She is not diaphoretic. No erythema. No pallor.  Psychiatric: She has a normal mood and affect. Her behavior is normal. Judgment and thought content normal.          Assessment & Plan:

## 2012-09-23 NOTE — Assessment & Plan Note (Signed)
Severe pain secondary to osteoarthritis of the left hip. Scheduled for bariatric surgery next week. Scheduled for hip replacement in February 2013. We discussed that she will need to stop meloxicam prior to bariatric surgery. Will continue hydrocodone as needed for pain. Followup in one month.

## 2012-09-23 NOTE — Assessment & Plan Note (Signed)
Symptoms are well controlled on Wellbutrin. We discussed potential rebound of depression symptoms after bariatric surgery. Will monitor closely. Discussed potential counseling given her mother's recent death. She will call or e-mail she would like to set up counseling services. Followup one month.

## 2012-09-24 ENCOUNTER — Encounter (HOSPITAL_COMMUNITY): Payer: Self-pay

## 2012-09-24 ENCOUNTER — Encounter (HOSPITAL_COMMUNITY)
Admission: RE | Admit: 2012-09-24 | Discharge: 2012-09-24 | Disposition: A | Discharge status: Home / Self Care | Payer: BC Managed Care – PPO | Source: Ambulatory Visit | Attending: General Surgery | Admitting: General Surgery

## 2012-09-24 HISTORY — DX: Nausea with vomiting, unspecified: R11.2

## 2012-09-24 HISTORY — DX: Gastro-esophageal reflux disease without esophagitis: K21.9

## 2012-09-24 HISTORY — DX: Other specified postprocedural states: Z98.890

## 2012-09-24 HISTORY — DX: Unspecified osteoarthritis, unspecified site: M19.90

## 2012-09-24 HISTORY — DX: Anxiety disorder, unspecified: F41.9

## 2012-09-24 HISTORY — DX: Inflammatory liver disease, unspecified: K75.9

## 2012-09-24 LAB — CBC WITH DIFFERENTIAL/PLATELET
Basophils Absolute: 0.1 10*3/uL (ref 0.0–0.1)
Basophils Relative: 1 % (ref 0–1)
Eosinophils Relative: 2 % (ref 0–5)
Lymphocytes Relative: 27 % (ref 12–46)
MCV: 91.4 fL (ref 78.0–100.0)
Platelets: 327 10*3/uL (ref 150–400)
RDW: 12.9 % (ref 11.5–15.5)
WBC: 8.4 10*3/uL (ref 4.0–10.5)

## 2012-09-24 LAB — COMPREHENSIVE METABOLIC PANEL
ALT: 19 U/L (ref 0–35)
AST: 14 U/L (ref 0–37)
Albumin: 3.8 g/dL (ref 3.5–5.2)
Alkaline Phosphatase: 84 U/L (ref 39–117)
Chloride: 99 mEq/L (ref 96–112)
Potassium: 4.1 mEq/L (ref 3.5–5.1)
Sodium: 137 mEq/L (ref 135–145)
Total Bilirubin: 0.3 mg/dL (ref 0.3–1.2)
Total Protein: 6.9 g/dL (ref 6.0–8.3)

## 2012-09-24 LAB — SURGICAL PCR SCREEN: MRSA, PCR: NEGATIVE

## 2012-09-24 NOTE — Progress Notes (Signed)
Anesthesia will consult patient on day of surgery

## 2012-09-24 NOTE — Progress Notes (Signed)
EKG 08/08/12 on EPIC

## 2012-09-24 NOTE — Patient Instructions (Addendum)
20 [name]  09/24/2012   Your procedure is scheduled on: 09/30/12  Report to [facility] at [phone].  Call this number if you have problems the morning of surgery 336-: [phone]   Remember:   Do not eat food or drink liquids After Midnight.     Take these medicines the morning of surgery with A SIP OF WATER: wellbutrin, tramadol if needed   Do not wear jewelry, make-up or nail polish.  Do not wear lotions, powders, or perfumes. You may wear deodorant.  Do not shave 48 hours prior to surgery. Men may shave face and neck.  Do not bring valuables to the hospital.  Contacts, dentures or bridgework may not be worn into surgery.  Leave suitcase in the car. After surgery it may be brought to your room.  For patients admitted to the hospital, checkout time is 11:00 AM the day of discharge.     Special Instructions: Shower using CHG 2 nights before surgery and the night before surgery.  If you shower the day of surgery use CHG.  Use special wash - you have one bottle of CHG for all showers.  You should use approximately 1/3 of the bottle for each shower.   Please read over the following fact sheets that you were given: MRSA Information.  Birdie Sons, RN  pre op nurse call if needed [phone]    FAILURE TO FOLLOW THESE INSTRUCTIONS MAY RESULT IN CANCELLATION OF YOUR SURGERY   Patient Signature: ___________________________________________

## 2012-09-26 ENCOUNTER — Ambulatory Visit (INDEPENDENT_AMBULATORY_CARE_PROVIDER_SITE_OTHER): Payer: BC Managed Care – PPO | Admitting: General Surgery

## 2012-09-26 ENCOUNTER — Encounter (INDEPENDENT_AMBULATORY_CARE_PROVIDER_SITE_OTHER): Payer: Self-pay | Admitting: General Surgery

## 2012-09-26 VITALS — BP 138/62 | HR 118 | Temp 98.4°F | Resp 18 | Ht 64.0 in | Wt 271.6 lb

## 2012-09-26 DIAGNOSIS — E669 Obesity, unspecified: Secondary | ICD-10-CM

## 2012-09-26 DIAGNOSIS — I10 Essential (primary) hypertension: Secondary | ICD-10-CM

## 2012-09-26 DIAGNOSIS — Z6841 Body Mass Index (BMI) 40.0 and over, adult: Secondary | ICD-10-CM

## 2012-09-26 DIAGNOSIS — K2100 Gastro-esophageal reflux disease with esophagitis, without bleeding: Secondary | ICD-10-CM

## 2012-09-26 NOTE — Progress Notes (Signed)
Patient ID: [name], female   DOB: 06/11/1956, 56 y.o.   MRN: [id number]  Chief Complaint  Patient presents with  . Bariatric Pre-op    sleeve    HPI [name] is a 56 y.o. female.   This patient has a BMI of 47 and comorbidities of arthritis and hypertension. She has completed her preoperative evaluation and is here for her preoperative visit. She is currently on a diet and has lost about 15 pounds. I have reviewed her upper GI which demonstrated a small hiatal hernia  And reflux but otherwise was normal.   HPI  Past Medical History  Diagnosis Date  . Morbid obesity   . Anxiety   . GERD (gastroesophageal reflux disease)   . Arthritis     left hip  . Hepatitis 1972    hep B  . PONV (postoperative nausea and vomiting)     Past Surgical History  Procedure Date  . Arthroscopic repair acl     bilateral knees  . Abdominal hysterectomy 04/1999  . Breast lumpectomy 06/1979; 03/1995    left  . Cholecystectomy 09/1987    Family History  Problem Relation Age of Onset  . Cancer Maternal Aunt     ovarian cancer  . Hyperlipidemia Mother   . Obesity Mother   . Cancer Father     Social History History  Substance Use Topics  . Smoking status: Former Smoker -- 0.2 packs/day for 3 years    Types: Cigarettes    Quit date: 07/25/2001  . Smokeless tobacco: Never Used  . Alcohol Use: Yes     Comment: rarely    No Known Allergies  Current Outpatient Prescriptions  Medication Sig Dispense Refill  . buPROPion (WELLBUTRIN SR) 200 MG 12 hr tablet Take 1 tablet (200 mg total) by mouth 2 (two) times daily.  60 tablet  6  . HYDROcodone-acetaminophen (VICODIN) 5-500 MG per tablet TAKE 2 TABLETS BY MOUTH EVERY 6 HOURS AS NEEDED FOR PAIN  30 tablet  3  . Multiple Vitamin (MULTIVITAMIN WITH MINERALS) TABS Take 1 tablet by mouth daily.      . Omega-3 Fatty Acids (FISH OIL) 1200 MG CAPS Take 1 capsule by mouth 2 (two) times daily.      . traMADol (ULTRAM) 50 MG tablet Take 2 tablets (100  mg total) by mouth every 8 (eight) hours as needed for pain.  90 tablet  3  . meloxicam (MOBIC) 15 MG tablet Take 15 mg by mouth daily.      . pantoprazole (PROTONIX) 40 MG tablet Take 40 mg by mouth daily.        Review of Systems Review of Systems All other review of systems negative or noncontributory except as stated in the HPI  Blood pressure 138/62, pulse 118, temperature 98.4 F (36.9 C), resp. rate 18, height 5' 4" (1.626 m), weight 271 lb 9.6 oz (123.197 kg).  Physical Exam Physical Exam Physical Exam  Nursing note and vitals reviewed. Constitutional: She is oriented to person, place, and time. She appears well-developed and well-nourished. No distress.  HENT:  Head: Normocephalic and atraumatic.  Mouth/Throat: No oropharyngeal exudate.  Eyes: Conjunctivae and EOM are normal. Pupils are equal, round, and reactive to light. Right eye exhibits no discharge. Left eye exhibits no discharge. No scleral icterus.  Neck: Normal range of motion. Neck supple. No tracheal deviation present.  Cardiovascular: Normal rate, regular rhythm, normal heart sounds and intact distal pulses.   Pulmonary/Chest: Effort normal and breath sounds   normal. No stridor. No respiratory distress. She has no wheezes.  Abdominal: Soft. Bowel sounds are normal. She exhibits no distension and no mass. There is no tenderness. There is no rebound and no guarding.  Musculoskeletal: Normal range of motion. She exhibits no edema and no tenderness.  Neurological: She is alert and oriented to person, place, and time.  Skin: Skin is warm and dry. No rash noted. She is not diaphoretic. No erythema. No pallor.  Psychiatric: She has a normal mood and affect. Her behavior is normal. Judgment and thought content normal.    Data Reviewed   Assessment    Morbid obesity with a BMI of 47 and comorbidities of hypertension and arthritis. I did discuss with her the options for weight loss including the medical and surgical  options. She remains interested in a sleeve gastrectomy. We discussed the risks of this procedure as well as the perioperative expectations. The risks of infection, bleeding, pain, scarring, weight regain, too little or too much weight loss, vitamin deficiencies and need for lifelong vitamin supplementation, hair loss, need for protein supplementation, leaks, stricture, reflux, food intolerance, need for reoperation and conversion to roux Y gastric bypass, need for open surgery, injury to spleen or surrounding structures, DVT's, PE, and death again discussed with the patient and the patient expressed understanding and desires to proceed with laparoscopic vertical sleeve gastrectomy, possible open, intraoperative endoscopy. I again discussed with her the possibility of worsening reflux and the need for conversion to Roux-en-Y gastric bypass as well as inadequate weight loss given her arthritis and inability to do much physical activity.     Plan    We will proceed with laparoscopic vertical sleeve gastrectomy next week       Linnie Mcglocklin DAVID 09/26/2012, 11:30 AM    

## 2012-09-30 ENCOUNTER — Encounter (HOSPITAL_COMMUNITY): Payer: Self-pay | Admitting: Anesthesiology

## 2012-09-30 ENCOUNTER — Ambulatory Visit (HOSPITAL_COMMUNITY): Payer: BC Managed Care – PPO | Admitting: Anesthesiology

## 2012-09-30 ENCOUNTER — Encounter (HOSPITAL_COMMUNITY)
Admission: RE | Disposition: A | Discharge status: Home / Self Care | Payer: Self-pay | Source: Ambulatory Visit | Attending: General Surgery

## 2012-09-30 ENCOUNTER — Inpatient Hospital Stay (HOSPITAL_COMMUNITY)
Admission: RE | Admit: 2012-09-30 | Discharge: 2012-10-02 | DRG: 293 | Disposition: A | Discharge status: Home / Self Care | Payer: BC Managed Care – PPO | Source: Ambulatory Visit | Attending: General Surgery | Admitting: General Surgery

## 2012-09-30 ENCOUNTER — Encounter (HOSPITAL_COMMUNITY): Payer: Self-pay | Admitting: *Deleted

## 2012-09-30 DIAGNOSIS — M129 Arthropathy, unspecified: Secondary | ICD-10-CM

## 2012-09-30 DIAGNOSIS — I10 Essential (primary) hypertension: Secondary | ICD-10-CM | POA: Diagnosis present

## 2012-09-30 DIAGNOSIS — F411 Generalized anxiety disorder: Secondary | ICD-10-CM | POA: Diagnosis present

## 2012-09-30 DIAGNOSIS — Z6841 Body Mass Index (BMI) 40.0 and over, adult: Secondary | ICD-10-CM

## 2012-09-30 DIAGNOSIS — K449 Diaphragmatic hernia without obstruction or gangrene: Secondary | ICD-10-CM | POA: Diagnosis present

## 2012-09-30 DIAGNOSIS — K219 Gastro-esophageal reflux disease without esophagitis: Secondary | ICD-10-CM | POA: Diagnosis present

## 2012-09-30 DIAGNOSIS — Z79899 Other long term (current) drug therapy: Secondary | ICD-10-CM

## 2012-09-30 DIAGNOSIS — Z01812 Encounter for preprocedural laboratory examination: Secondary | ICD-10-CM

## 2012-09-30 HISTORY — PX: UPPER GI ENDOSCOPY: SHX6162

## 2012-09-30 HISTORY — PX: LAPAROSCOPIC GASTRIC SLEEVE RESECTION: SHX5895

## 2012-09-30 SURGERY — GASTRECTOMY, SLEEVE, LAPAROSCOPIC
Anesthesia: General | Site: Abdomen | Wound class: Clean Contaminated

## 2012-09-30 MED ORDER — SODIUM CHLORIDE 0.9 % IV SOLN
1.0000 g | INTRAVENOUS | Status: AC
  Administered 2012-09-30: 1 g via INTRAVENOUS
  Filled 2012-09-30: qty 1

## 2012-09-30 MED ORDER — PANTOPRAZOLE SODIUM 40 MG IV SOLR
40.0000 mg | INTRAVENOUS | Status: DC
  Administered 2012-09-30 – 2012-10-01 (×2): 40 mg via INTRAVENOUS
  Filled 2012-09-30 (×3): qty 40

## 2012-09-30 MED ORDER — TISSEEL VH 10 ML EX KIT
PACK | CUTANEOUS | Status: DC | PRN
  Administered 2012-09-30: 10 mL

## 2012-09-30 MED ORDER — KCL IN DEXTROSE-NACL 20-5-0.45 MEQ/L-%-% IV SOLN
INTRAVENOUS | Status: DC
  Administered 2012-09-30: 12:00:00 via INTRAVENOUS
  Administered 2012-09-30: 1000 mL via INTRAVENOUS
  Administered 2012-10-01: 10:00:00 via INTRAVENOUS
  Administered 2012-10-01: 125 mL via INTRAVENOUS
  Administered 2012-10-01: 18:00:00 via INTRAVENOUS
  Filled 2012-09-30 (×6): qty 1000

## 2012-09-30 MED ORDER — ACETAMINOPHEN 10 MG/ML IV SOLN
INTRAVENOUS | Status: DC | PRN
  Administered 2012-09-30: 1000 mg via INTRAVENOUS

## 2012-09-30 MED ORDER — UNJURY CHOCOLATE CLASSIC POWDER
2.0000 [oz_av] | Freq: Four times a day (QID) | ORAL | Status: DC
  Filled 2012-09-30 (×4): qty 27

## 2012-09-30 MED ORDER — SUCCINYLCHOLINE CHLORIDE 20 MG/ML IJ SOLN
INTRAMUSCULAR | Status: DC | PRN
  Administered 2012-09-30: 100 mg via INTRAVENOUS

## 2012-09-30 MED ORDER — HEPARIN SODIUM (PORCINE) 5000 UNIT/ML IJ SOLN
5000.0000 [IU] | Freq: Once | INTRAMUSCULAR | Status: AC
  Administered 2012-09-30: 5000 [IU] via SUBCUTANEOUS
  Filled 2012-09-30: qty 1

## 2012-09-30 MED ORDER — OXYCODONE-ACETAMINOPHEN 5-325 MG/5ML PO SOLN
5.0000 mL | ORAL | Status: DC | PRN
  Administered 2012-10-01: 10 mL via ORAL
  Filled 2012-09-30: qty 10

## 2012-09-30 MED ORDER — DEXAMETHASONE SODIUM PHOSPHATE 10 MG/ML IJ SOLN
INTRAMUSCULAR | Status: DC | PRN
  Administered 2012-09-30: 10 mg via INTRAVENOUS

## 2012-09-30 MED ORDER — SCOPOLAMINE 1 MG/3DAYS TD PT72
MEDICATED_PATCH | TRANSDERMAL | Status: AC
  Filled 2012-09-30: qty 1

## 2012-09-30 MED ORDER — ONDANSETRON HCL 4 MG/2ML IJ SOLN
INTRAMUSCULAR | Status: DC | PRN
  Administered 2012-09-30: 4 mg via INTRAVENOUS

## 2012-09-30 MED ORDER — ENOXAPARIN SODIUM 40 MG/0.4ML ~~LOC~~ SOLN
40.0000 mg | SUBCUTANEOUS | Status: DC
  Administered 2012-09-30 – 2012-10-01 (×2): 40 mg via SUBCUTANEOUS
  Filled 2012-09-30 (×3): qty 0.4

## 2012-09-30 MED ORDER — MEPERIDINE HCL 50 MG/ML IJ SOLN: 6.2500 mg | INTRAMUSCULAR | Status: DC | PRN

## 2012-09-30 MED ORDER — LIDOCAINE HCL (CARDIAC) 20 MG/ML IV SOLN
INTRAVENOUS | Status: DC | PRN
  Administered 2012-09-30: 100 mg via INTRAVENOUS

## 2012-09-30 MED ORDER — PROMETHAZINE HCL 25 MG/ML IJ SOLN: 6.2500 mg | INTRAMUSCULAR | Status: DC | PRN

## 2012-09-30 MED ORDER — SCOPOLAMINE 1 MG/3DAYS TD PT72
MEDICATED_PATCH | TRANSDERMAL | Status: DC | PRN
  Administered 2012-09-30: 1 via TRANSDERMAL

## 2012-09-30 MED ORDER — PROPOFOL 10 MG/ML IV BOLUS
INTRAVENOUS | Status: DC | PRN
  Administered 2012-09-30: 200 mg via INTRAVENOUS

## 2012-09-30 MED ORDER — SODIUM CHLORIDE 0.9 % IV SOLN
INTRAVENOUS | Status: AC
  Filled 2012-09-30: qty 1

## 2012-09-30 MED ORDER — MORPHINE SULFATE 2 MG/ML IJ SOLN
2.0000 mg | INTRAMUSCULAR | Status: DC | PRN
  Administered 2012-09-30: 4 mg via INTRAVENOUS
  Administered 2012-09-30 (×2): 2 mg via INTRAVENOUS
  Administered 2012-10-01 (×2): 4 mg via INTRAVENOUS
  Administered 2012-10-01: 2 mg via INTRAVENOUS
  Administered 2012-10-01: 4 mg via INTRAVENOUS
  Filled 2012-09-30 (×3): qty 2
  Filled 2012-09-30 (×2): qty 1
  Filled 2012-09-30: qty 2
  Filled 2012-09-30: qty 1

## 2012-09-30 MED ORDER — LIDOCAINE-EPINEPHRINE 1 %-1:100000 IJ SOLN
INTRAMUSCULAR | Status: AC
  Filled 2012-09-30: qty 1

## 2012-09-30 MED ORDER — KETOROLAC TROMETHAMINE 30 MG/ML IJ SOLN
30.0000 mg | Freq: Four times a day (QID) | INTRAMUSCULAR | Status: DC | PRN
  Administered 2012-09-30 – 2012-10-01 (×2): 30 mg via INTRAVENOUS
  Filled 2012-09-30 (×2): qty 1

## 2012-09-30 MED ORDER — CISATRACURIUM BESYLATE (PF) 10 MG/5ML IV SOLN
INTRAVENOUS | Status: DC | PRN
  Administered 2012-09-30: 2 mg via INTRAVENOUS
  Administered 2012-09-30: 8 mg via INTRAVENOUS

## 2012-09-30 MED ORDER — SUFENTANIL CITRATE 50 MCG/ML IV SOLN
INTRAVENOUS | Status: DC | PRN
  Administered 2012-09-30 (×3): 10 ug via INTRAVENOUS
  Administered 2012-09-30: 20 ug via INTRAVENOUS

## 2012-09-30 MED ORDER — TISSEEL VH 10 ML EX KIT
PACK | CUTANEOUS | Status: AC
  Filled 2012-09-30: qty 1

## 2012-09-30 MED ORDER — KCL IN DEXTROSE-NACL 20-5-0.45 MEQ/L-%-% IV SOLN
INTRAVENOUS | Status: AC
  Filled 2012-09-30: qty 1000

## 2012-09-30 MED ORDER — HYDROMORPHONE HCL PF 1 MG/ML IJ SOLN
INTRAMUSCULAR | Status: AC
  Filled 2012-09-30: qty 1

## 2012-09-30 MED ORDER — METOCLOPRAMIDE HCL 5 MG/ML IJ SOLN
INTRAMUSCULAR | Status: DC | PRN
  Administered 2012-09-30: 10 mg via INTRAVENOUS

## 2012-09-30 MED ORDER — LACTATED RINGERS IV SOLN: INTRAVENOUS | Status: DC

## 2012-09-30 MED ORDER — UNJURY VANILLA POWDER
2.0000 [oz_av] | Freq: Four times a day (QID) | ORAL | Status: DC
  Filled 2012-09-30 (×4): qty 27

## 2012-09-30 MED ORDER — BUPIVACAINE HCL 0.25 % IJ SOLN
INTRAMUSCULAR | Status: DC | PRN
  Administered 2012-09-30: 20 mL

## 2012-09-30 MED ORDER — UNJURY CHICKEN SOUP POWDER
2.0000 [oz_av] | Freq: Four times a day (QID) | ORAL | Status: DC
  Filled 2012-09-30 (×4): qty 27

## 2012-09-30 MED ORDER — ACETAMINOPHEN 10 MG/ML IV SOLN: 1000.0000 mg | Freq: Once | INTRAVENOUS | Status: DC | PRN

## 2012-09-30 MED ORDER — LIDOCAINE-EPINEPHRINE 1 %-1:100000 IJ SOLN
INTRAMUSCULAR | Status: DC | PRN
  Administered 2012-09-30: 20 mL

## 2012-09-30 MED ORDER — LACTATED RINGERS IR SOLN
Status: DC | PRN
  Administered 2012-09-30: 3000 mL

## 2012-09-30 MED ORDER — ONDANSETRON HCL 4 MG/2ML IJ SOLN
4.0000 mg | INTRAMUSCULAR | Status: DC | PRN
  Administered 2012-10-01: 4 mg via INTRAVENOUS
  Filled 2012-09-30: qty 2

## 2012-09-30 MED ORDER — HYDROMORPHONE HCL PF 1 MG/ML IJ SOLN
0.2500 mg | INTRAMUSCULAR | Status: DC | PRN
  Administered 2012-09-30 (×4): 0.5 mg via INTRAVENOUS

## 2012-09-30 MED ORDER — MIDAZOLAM HCL 5 MG/5ML IJ SOLN
INTRAMUSCULAR | Status: DC | PRN
  Administered 2012-09-30 (×2): 1 mg via INTRAVENOUS

## 2012-09-30 MED ORDER — ACETAMINOPHEN 10 MG/ML IV SOLN
INTRAVENOUS | Status: AC
  Filled 2012-09-30: qty 100

## 2012-09-30 MED ORDER — GLYCOPYRROLATE 0.2 MG/ML IJ SOLN
INTRAMUSCULAR | Status: DC | PRN
  Administered 2012-09-30: .6 mg via INTRAVENOUS

## 2012-09-30 MED ORDER — ACETAMINOPHEN 160 MG/5ML PO SOLN: 650.0000 mg | ORAL | Status: DC | PRN

## 2012-09-30 MED ORDER — LACTATED RINGERS IV SOLN
INTRAVENOUS | Status: DC | PRN
  Administered 2012-09-30 (×2): via INTRAVENOUS

## 2012-09-30 MED ORDER — OXYCODONE HCL 5 MG/5ML PO SOLN
5.0000 mg | Freq: Once | ORAL | Status: DC | PRN
  Filled 2012-09-30: qty 5

## 2012-09-30 MED ORDER — NEOSTIGMINE METHYLSULFATE 1 MG/ML IJ SOLN
INTRAMUSCULAR | Status: DC | PRN
  Administered 2012-09-30: 4 mg via INTRAVENOUS

## 2012-09-30 MED ORDER — OXYCODONE HCL 5 MG PO TABS: 5.0000 mg | ORAL_TABLET | Freq: Once | ORAL | Status: DC | PRN

## 2012-09-30 MED ORDER — BUPIVACAINE HCL (PF) 0.25 % IJ SOLN
INTRAMUSCULAR | Status: AC
  Filled 2012-09-30: qty 30

## 2012-09-30 SURGICAL SUPPLY — 53 items
APPLICATOR COTTON TIP 6IN STRL (MISCELLANEOUS) IMPLANT
APPLIER CLIP ROT 10 11.4 M/L (STAPLE) ×3
CABLE HIGH FREQUENCY MONO STRZ (ELECTRODE) ×3 IMPLANT
CANISTER SUCTION 2500CC (MISCELLANEOUS) ×3 IMPLANT
CHLORAPREP W/TINT 26ML (MISCELLANEOUS) ×6 IMPLANT
CLIP APPLIE ROT 10 11.4 M/L (STAPLE) ×2 IMPLANT
CLOTH BEACON ORANGE TIMEOUT ST (SAFETY) ×3 IMPLANT
COVER SURGICAL LIGHT HANDLE (MISCELLANEOUS) ×3 IMPLANT
DERMABOND ADVANCED (GAUZE/BANDAGES/DRESSINGS)
DERMABOND ADVANCED .7 DNX12 (GAUZE/BANDAGES/DRESSINGS) IMPLANT
DEVICE SUTURE ENDOST 10MM (ENDOMECHANICALS) IMPLANT
DRAIN CHANNEL 19F RND (DRAIN) ×3 IMPLANT
DRAPE LAPAROSCOPIC ABDOMINAL (DRAPES) ×3 IMPLANT
ELECT REM PT RETURN 9FT ADLT (ELECTROSURGICAL) ×3
ELECTRODE REM PT RTRN 9FT ADLT (ELECTROSURGICAL) ×2 IMPLANT
EVACUATOR DRAINAGE 10X20 100CC (DRAIN) ×2 IMPLANT
EVACUATOR SILICONE 100CC (DRAIN) ×1
GLOVE SURG SS PI 7.5 STRL IVOR (GLOVE) ×6 IMPLANT
GOWN STRL NON-REIN LRG LVL3 (GOWN DISPOSABLE) ×3 IMPLANT
GOWN STRL REIN XL XLG (GOWN DISPOSABLE) ×12 IMPLANT
HANDLE STAPLE EGIA 4 XL (STAPLE) ×3 IMPLANT
HOVERMATT SINGLE USE (MISCELLANEOUS) ×3 IMPLANT
KIT BASIN OR (CUSTOM PROCEDURE TRAY) ×3 IMPLANT
MARKER SKIN DUAL TIP RULER LAB (MISCELLANEOUS) ×3 IMPLANT
NEEDLE SPNL 22GX3.5 QUINCKE BK (NEEDLE) ×3 IMPLANT
NS IRRIG 1000ML POUR BTL (IV SOLUTION) ×3 IMPLANT
PENCIL BUTTON HOLSTER BLD 10FT (ELECTRODE) ×3 IMPLANT
POUCH SPECIMEN RETRIEVAL 10MM (ENDOMECHANICALS) IMPLANT
RELOAD BLACK 60MM ECHELON (STAPLE) IMPLANT
RELOAD EGIA 60 MED/THCK PURPLE (STAPLE) ×6 IMPLANT
RELOAD GREEN (STAPLE) IMPLANT
RELOAD TRI 2.0 60 XTHK VAS SUL (STAPLE) ×6 IMPLANT
SCISSORS LAP 5X35 DISP (ENDOMECHANICALS) ×3 IMPLANT
SEALANT SURGICAL APPL DUAL CAN (MISCELLANEOUS) ×3 IMPLANT
SET IRRIG TUBING LAPAROSCOPIC (IRRIGATION / IRRIGATOR) ×3 IMPLANT
SHEARS CURVED HARMONIC AC 45CM (MISCELLANEOUS) ×3 IMPLANT
SLEEVE XCEL OPT CAN 5 100 (ENDOMECHANICALS) ×6 IMPLANT
SOLUTION ANTI FOG 6CC (MISCELLANEOUS) ×3 IMPLANT
SPONGE GAUZE 4X4 12PLY (GAUZE/BANDAGES/DRESSINGS) IMPLANT
SPONGE LAP 18X18 X RAY DECT (DISPOSABLE) ×3 IMPLANT
STAPLE ECHEON FLEX 60 POW ENDO (STAPLE) ×3 IMPLANT
STRIP PERI DRY VERITAS 60 (STAPLE) IMPLANT
SUT ETHILON 2 0 PS N (SUTURE) ×3 IMPLANT
SUT MNCRL AB 4-0 PS2 18 (SUTURE) ×6 IMPLANT
SUT VICRYL 0 UR6 27IN ABS (SUTURE) ×3 IMPLANT
SYR 50ML LL SCALE MARK (SYRINGE) ×3 IMPLANT
TRAY FOLEY CATH 14FRSI W/METER (CATHETERS) ×3 IMPLANT
TRAY LAP CHOLE (CUSTOM PROCEDURE TRAY) ×3 IMPLANT
TROCAR BLADELESS 15MM (ENDOMECHANICALS) ×3 IMPLANT
TROCAR BLADELESS OPT 5 100 (ENDOMECHANICALS) ×3 IMPLANT
TUBING CONNECTING 10 (TUBING) ×3 IMPLANT
TUBING ENDO SMARTCAP (MISCELLANEOUS) ×3 IMPLANT
TUBING FILTER THERMOFLATOR (ELECTROSURGICAL) ×3 IMPLANT

## 2012-09-30 NOTE — Anesthesia Postprocedure Evaluation (Signed)
Anesthesia Post Note  Patient: [name]  Procedure(s) Performed: Procedure(s) (LRB): LAPAROSCOPIC GASTRIC SLEEVE RESECTION (N/A) UPPER GI ENDOSCOPY ()  Anesthesia type: General  Patient location: PACU  Post pain: Pain level controlled  Post assessment: Post-op Vital signs reviewed  Last Vitals: BP 152/95  Pulse 89  Temp 33.3 C (Oral)  Resp 14  Ht 5\' 4"  (1.626 m)  Wt 268 lb 2 oz (121.621 kg)  BMI 46.02 kg/m2  SpO2 97%  Post vital signs: Reviewed  Level of consciousness: sedated  Complications: No apparent anesthesia complications

## 2012-09-30 NOTE — Op Note (Signed)
[name], SCHNETZER NO.:  [id number]  MEDICAL RECORD NO.:  [id number]  LOCATION:  1528                         FACILITY:  [facility]  PHYSICIAN:  Lodema Pilot, MD       DATE OF BIRTH:  1955/11/25  DATE OF PROCEDURE:  09/30/2012 DATE OF DISCHARGE:                              OPERATIVE REPORT   PROCEDURE:  Laparoscopic vertical sleeve gastrectomy with laparoscopic hiatal hernia repair.  PREOPERATIVE DIAGNOSIS:  Morbid obesity.  POSTOPERATIVE DIAGNOSIS:  Morbid obesity.  SURGEON:  Lodema Pilot, MD  ASSISTANT:  Thornton Park. Daphine Deutscher, MD  ANESTHESIA:  General endotracheal tube anesthesia, and 40 mL 1% lidocaine with epinephrine and 0.25% Marcaine in a 50:50 mixture.  FLUIDS:  1500 mL of crystalloid.  ESTIMATED BLOOD LOSS:  Minimal.  DRAINS:  A 19-French Blake drain placed along the staple line.  SPECIMEN:  Sleeve gastrectomy sent to Pathology for permanent sectioning.  COMPLICATIONS:  None apparent.  FINDINGS:  Moderate-sized hiatal hernia with herniated gastric fats. Sleeve gastrectomy was 36-French bougie.  Intraoperative leak test negative.  INDICATION FOR PROCEDURE:  Ms. Harral is a 56 year old female with BMI of 47 and comorbidities of arthritis and hypertension.  She has failed medical weight loss attempts and she is in need of a hip replacement as well, but her orthopedic surgeon has recommended weight loss prior to hip surgery and she is in need of durable weight loss attempts.  OPERATIVE DETAILS:  Ms. Willis was seen and evaluated in the preoperative area and risks and benefits of procedure were discussed in lay terms. Informed consent was obtained.  She was given prophylactic antibiotics and subcu heparin,and taken to the operating room, placed on the table in supine position.  General endotracheal tube anesthesia was obtained and Foley catheter was placed.  Her abdomen was prepped and draped in a standard surgical fashion and procedure time-out  was performed with all operative team members to confirm proper patient and procedure.  Then, a 5-mm Optiview trocar was used to access the peritoneum in the left upper quadrant.  On the first attempt, pneumoperitoneum was obtained. Laparoscope was introduced and she had some fatty adhesions to the abdominal wall mainly up in her right upper quadrant pain from her prior cholecystectomy procedure.  She also had some adhesions near the umbilicus, but these were not interfering.  There was no obvious bowel adherent to the abdominal wall.  Additional 5-mm port was placed in the left rectus region and the adhesions were taken down with sharp dissection and no cautery was used for the adhesiolysis.  After adhesions were taken down because it came down not pretty easily, I was able to place my standard ports 5 mm right upper quadrant port and a 15- mm right rectus port, all under direct visualization.  Nathanson liver retractor was placed to the epigastrium through a separate stab incision.  The liver was retracted anteriorly.  I measured 5 cm from the pylorus, which was clearly visualized and started to divide the short gastric vessels along the greater curvature of the stones, these were all divided using Harmonic scalpel.  Dissection was carried up around the spleen and onto the diaphragm.  The left crus of the diaphragm was identified and the stomach was completely mobilized from the left crus. She was noted at this time to have moderate-sized hiatal hernia with some posterior stomach herniating into the mediastinum, although this would not allow the stomach in the mediastinum.  I fully mobilized the left crus and carried the dissection anteriorly to the gastro- esophagophrenic ligaments.  She had a gastric fat.  The moderate-sized epigastric fat pad, which was herniated anterior, this was reduced.  I mobilized the right crus taking care to avoid injury to the vasculature of the lesser  curve.  The phrenic nerve to the liver was preserved.  The anterior and posterior vagus nerves were visualized and preserved as well.  I placed a 0 Ethibond suture posteriorly through the crus approximating the hiatus posteriorly as well as another figure-of-8 suture anteriorly approximating the crus anteriorly.  The GE junction was clearly down in the abdomen and there was no undue tension on the GE junction.  With the hiatal hernia repair, we attempted to pass the 36- French bougie, but it was not passing with the hiatal closure.  So, the posterior suture was cut out and the bougie then was passed easily. Prior to passing the bougie all the day down, I remeasured 5 cm from the pylorus and took my first firing of this staple line using the black Tri- Staple 60-mm staple load, taking care to avoid narrowing of the stomach at the angularis.  The second black Tri-Staple load was placed and anticipated angle of firing and the bougie was then passed down to the antrum and pylorus, and the second firing was taken.  Subsequent firings with the purple Tri-Staple load were taken to treat the remainder of the sleeve taking care to avoid spiraling of the staple line and Christmas Tree formation of the sleeve.  The final firing was taken leaving the small piece of stomach near the angle of His taking care to angle not incorporate the gastric fat pad and division of the stomach and ensuring that we were not stapling on the esophagus.  The bougie was removed and it was noted to be complete.  At this time because I had to cut out the posterior crural stitch in order to pass the bougie.  I replaced the posterior suture little bit lower posteriorly with the 2-0 Ethibond suture.  There was a small opening left around the esophagus and to avoid making the chloral closure two type with the epigastric fat pad remained in the stomach without any difficulty or any traction. Hemostasis was obtained along the  staple line with few hemoclips and then Dr. Daphine Deutscher performed upper endoscopy and passed the well-lubricated fiberoptic endoscope into the esophagus and into the sleeve.  The sleeve appeared to be symmetric tube without any areas of stricturing or narrowing.  There was no internal bleeding.  The pylorus was easily visualized.  He insufflated the sleeve, I had its emergent to water. There was no evidence of any air leak.  The air was suctioned and the scope was removed, and the sleeve staple line was again inspected for hemostasis, which was noted to be adequate.  The heel fibrin glue was applied along the staple line, and the 15-mm port site was enlarged and the resected portion of the stomach was removed.  This was passed off the table and sent to Pathology for permanent sectioning.  The port was replaced and a 19-French Blake drain was passed into the abdomen and left  just posterior to the sleeve staple lines.  This was exited out through the left upper quadrant trocar site and sutured in place with a nylon drain stitch.  The omentum was placed back over the sleeve.  The liver retractor was removed and the 15-mm port site was then approximated in open fashion with 0 Vicryl sutures.  The sutures were secured and the abdomen was reinsufflated with carbon dioxide gas.  The abdominal wall closure was noted to be adequate without any evidence of bowel injury.  The right upper quadrant trocar was removed under direct visualization.  The abdominal wall was noted to be hemostatic.  The abdomen was noted to be hemostatic.  There was no evidence of bleeding or bowel injury.  The final port was removed and the wounds were injected with total of 40 mL of 1% lidocaine with epinephrine and 0.25% Marcaine in a 50:50 mixture. Skin edges were approximated with 4-0 Monocryl subcuticular suture. Skin was washed and dried, and Dermabond was applied.  All sponge, needle, and instrument counts were correct at  the end of the case.  The patient tolerated the procedure well without apparent complication.          ______________________________ Lodema Pilot, MD     BL/MEDQ  D:  09/30/2012  T:  09/30/2012  Job:  [id number]

## 2012-09-30 NOTE — Anesthesia Procedure Notes (Signed)
Procedure Name: Intubation Date/Time: 09/30/2012 7:27 AM Performed by: Leroy Libman L Patient Re-evaluated:Patient Re-evaluated prior to inductionOxygen Delivery Method: Circle system utilized Preoxygenation: Pre-oxygenation with 100% oxygen Intubation Type: IV induction Ventilation: Mask ventilation without difficulty and Oral airway inserted - appropriate to patient size Laryngoscope Size: Hyacinth Meeker and 2 Grade View: Grade I Tube type: Oral Tube size: 7.5 mm Number of attempts: 1 Airway Equipment and Method: Stylet Placement Confirmation: ETT inserted through vocal cords under direct vision,  breath sounds checked- equal and bilateral and positive ETCO2 Secured at: 21 cm Tube secured with: Tape Dental Injury: Teeth and Oropharynx as per pre-operative assessment

## 2012-09-30 NOTE — Interval H&P Note (Signed)
History and Physical Interval Note:  09/30/2012 7:13 AM  [name]  has presented today for surgery, with the diagnosis of morbid obesity   The various methods of treatment have been discussed with the patient and family. After consideration of risks, benefits and other options for treatment, the patient has consented to  Procedure(s) (LRB) with comments: LAPAROSCOPIC GASTRIC SLEEVE RESECTION (N/A) as a surgical intervention .  The patient's history has been reviewed, patient examined, no change in status, stable for surgery.  I have reviewed the patient's chart and labs.  Questions were answered to the patient's satisfaction.  She was seen and evaluated in preop area.  Risks of procedure again discussed with the patient.  The risks of infection, bleeding, pain, scarring, weight regain, too little or too much weight loss, vitamin deficiencies and need for lifelong vitamin supplementation, hair loss, need for protein supplementation, leaks, stricture, reflux, food intolerance, need for reoperation and conversion to roux Y gastric bypass, need for open surgery, injury to spleen or surrounding structures, DVT's, PE, and death again discussed with the patient and the patient expressed understanding and desires to proceed with laparoscopic vertical sleeve gastrectomy, possible open, intraoperative endoscopy. We again discussed the possiblity of worsening reflux and the need for conversion to RYGB, also the need for open surgery given her prior surgery.    Lodema Pilot DAVID

## 2012-09-30 NOTE — Brief Op Note (Addendum)
09/30/2012  9:43 AM  PATIENT:  [name]  56 y.o. female  PRE-OPERATIVE DIAGNOSIS:  morbid obesity   POST-OPERATIVE DIAGNOSIS:  morbid obesity   PROCEDURE:  Procedure(s) (LRB) with comments: LAPAROSCOPIC GASTRIC SLEEVE RESECTION (N/A) UPPER GI ENDOSCOPY ()  SURGEON:  Surgeon(s) and Role:    * Lodema Pilot, DO - Primary    * Valarie Merino, MD - Assisting  PHYSICIAN ASSISTANT:   ASSISTANTS: Martin   ANESTHESIA:   general  EBL:  Total I/O In: 1000 [I.V.:1000] Out: 150 [Urine:125; Blood:25]  BLOOD ADMINISTERED:none  DRAINS: (68F) Jackson-Pratt drain(s) with closed bulb suction in the sleeve staple line   LOCAL MEDICATIONS USED:  MARCAINE    and LIDOCAINE   SPECIMEN:  Source of Specimen:  sleeve gastrectomy  DISPOSITION OF SPECIMEN:  PATHOLOGY  COUNTS:  YES  TOURNIQUET:  * No tourniquets in log *  DICTATION: .Other Dictation: Dictation Number  [id number]  PLAN OF CARE: Admit to inpatient   PATIENT DISPOSITION:  PACU - hemodynamically stable.   Delay start of Pharmacological VTE agent (>24hrs) due to surgical blood loss or risk of bleeding: no

## 2012-09-30 NOTE — Progress Notes (Signed)
NOS  Looks good. She's walking the hall with her husband.  Ovidio Kin, MD, [facility] Surgery Pager: [phone] Office phone:  [phone]

## 2012-09-30 NOTE — Preoperative (Signed)
Beta Blockers   Reason not to administer Beta Blockers:Not Applicable 

## 2012-09-30 NOTE — Anesthesia Preprocedure Evaluation (Addendum)
Anesthesia Evaluation  Patient identified by MRN, date of birth, ID band Patient awake    Reviewed: Allergy & Precautions, H&P , NPO status , Patient's Chart, lab work & pertinent test results  History of Anesthesia Complications (+) PONV  Airway Mallampati: II TM Distance: >3 FB Neck ROM: Full    Dental  (+) Dental Advisory Given and Teeth Intact   Pulmonary former smoker,  breath sounds clear to auscultation  Pulmonary exam normal       Cardiovascular - Past MI and - CHF Rhythm:Regular Rate:Tachycardia     Neuro/Psych PSYCHIATRIC DISORDERS Anxiety Depression negative neurological ROS     GI/Hepatic GERD-  Medicated,(+) Hepatitis -, B  Endo/Other  Morbid obesity  Renal/GU negative Renal ROS     Musculoskeletal negative musculoskeletal ROS (+)   Abdominal (+) + obese,   Peds  Hematology negative hematology ROS (+)   Anesthesia Other Findings   Reproductive/Obstetrics                          Anesthesia Physical Anesthesia Plan  ASA: III  Anesthesia Plan: General   Post-op Pain Management:    Induction: Intravenous  Airway Management Planned: Oral ETT  Additional Equipment:   Intra-op Plan:   Post-operative Plan: Extubation in OR  Informed Consent: I have reviewed the patients History and Physical, chart, labs and discussed the procedure including the risks, benefits and alternatives for the proposed anesthesia with the patient or authorized representative who has indicated his/her understanding and acceptance.   Dental advisory given  Plan Discussed with: CRNA  Anesthesia Plan Comments:         Anesthesia Quick Evaluation

## 2012-09-30 NOTE — Transfer of Care (Signed)
Immediate Anesthesia Transfer of Care Note  Patient: [name]  Procedure(s) Performed: Procedure(s) (LRB) with comments: LAPAROSCOPIC GASTRIC SLEEVE RESECTION (N/A) UPPER GI ENDOSCOPY ()  Patient Location: PACU  Anesthesia Type:General  Level of Consciousness: awake, alert  and oriented  Airway & Oxygen Therapy: Patient Spontanous Breathing and Patient connected to face mask oxygen  Post-op Assessment: Report given to PACU RN and Post -op Vital signs reviewed and stable  Post vital signs: Reviewed and stable  Complications: No apparent anesthesia complications

## 2012-09-30 NOTE — H&P (View-Only) (Signed)
Patient ID: [name], female   DOB: [birth year], 56 y.o.   MRN: [id number]  Chief Complaint  Patient presents with  . Bariatric Pre-op    sleeve    HPI [name] is a 56 y.o. female.   This patient has a BMI of 47 and comorbidities of arthritis and hypertension. She has completed her preoperative evaluation and is here for her preoperative visit. She is currently on a diet and has lost about 15 pounds. I have reviewed her upper GI which demonstrated a small hiatal hernia  And reflux but otherwise was normal.   HPI  Past Medical History  Diagnosis Date  . Morbid obesity   . Anxiety   . GERD (gastroesophageal reflux disease)   . Arthritis     left hip  . Hepatitis 1972    hep B  . PONV (postoperative nausea and vomiting)     Past Surgical History  Procedure Date  . Arthroscopic repair acl     bilateral knees  . Abdominal hysterectomy 04/1999  . Breast lumpectomy 06/1979; 03/1995    left  . Cholecystectomy 09/1987    Family History  Problem Relation Age of Onset  . Cancer Maternal Aunt     ovarian cancer  . Hyperlipidemia Mother   . Obesity Mother   . Cancer Father     Social History History  Substance Use Topics  . Smoking status: Former Smoker -- 0.2 packs/day for 3 years    Types: Cigarettes    Quit date: 07/25/2001  . Smokeless tobacco: Never Used  . Alcohol Use: Yes     Comment: rarely    No Known Allergies  Current Outpatient Prescriptions  Medication Sig Dispense Refill  . buPROPion (WELLBUTRIN SR) 200 MG 12 hr tablet Take 1 tablet (200 mg total) by mouth 2 (two) times daily.  60 tablet  6  . HYDROcodone-acetaminophen (VICODIN) 5-500 MG per tablet TAKE 2 TABLETS BY MOUTH EVERY 6 HOURS AS NEEDED FOR PAIN  30 tablet  3  . Multiple Vitamin (MULTIVITAMIN WITH MINERALS) TABS Take 1 tablet by mouth daily.      . Omega-3 Fatty Acids (FISH OIL) 1200 MG CAPS Take 1 capsule by mouth 2 (two) times daily.      . traMADol (ULTRAM) 50 MG tablet Take 2 tablets (100  mg total) by mouth every 8 (eight) hours as needed for pain.  90 tablet  3  . meloxicam (MOBIC) 15 MG tablet Take 15 mg by mouth daily.      . pantoprazole (PROTONIX) 40 MG tablet Take 40 mg by mouth daily.        Review of Systems Review of Systems All other review of systems negative or noncontributory except as stated in the HPI  Blood pressure 138/62, pulse 118, temperature 98.4 F (36.9 C), resp. rate 18, height 5\' 4"  (1.626 m), weight 271 lb 9.6 oz (123.197 kg).  Physical Exam Physical Exam Physical Exam  Nursing note and vitals reviewed. Constitutional: She is oriented to person, place, and time. She appears well-developed and well-nourished. No distress.  HENT:  Head: Normocephalic and atraumatic.  Mouth/Throat: No oropharyngeal exudate.  Eyes: Conjunctivae and EOM are normal. Pupils are equal, round, and reactive to light. Right eye exhibits no discharge. Left eye exhibits no discharge. No scleral icterus.  Neck: Normal range of motion. Neck supple. No tracheal deviation present.  Cardiovascular: Normal rate, regular rhythm, normal heart sounds and intact distal pulses.   Pulmonary/Chest: Effort normal and breath sounds  normal. No stridor. No respiratory distress. She has no wheezes.  Abdominal: Soft. Bowel sounds are normal. She exhibits no distension and no mass. There is no tenderness. There is no rebound and no guarding.  Musculoskeletal: Normal range of motion. She exhibits no edema and no tenderness.  Neurological: She is alert and oriented to person, place, and time.  Skin: Skin is warm and dry. No rash noted. She is not diaphoretic. No erythema. No pallor.  Psychiatric: She has a normal mood and affect. Her behavior is normal. Judgment and thought content normal.    Data Reviewed   Assessment    Morbid obesity with a BMI of 47 and comorbidities of hypertension and arthritis. I did discuss with her the options for weight loss including the medical and surgical  options. She remains interested in a sleeve gastrectomy. We discussed the risks of this procedure as well as the perioperative expectations. The risks of infection, bleeding, pain, scarring, weight regain, too little or too much weight loss, vitamin deficiencies and need for lifelong vitamin supplementation, hair loss, need for protein supplementation, leaks, stricture, reflux, food intolerance, need for reoperation and conversion to roux Y gastric bypass, need for open surgery, injury to spleen or surrounding structures, DVT's, PE, and death again discussed with the patient and the patient expressed understanding and desires to proceed with laparoscopic vertical sleeve gastrectomy, possible open, intraoperative endoscopy. I again discussed with her the possibility of worsening reflux and the need for conversion to Roux-en-Y gastric bypass as well as inadequate weight loss given her arthritis and inability to do much physical activity.     Plan    We will proceed with laparoscopic vertical sleeve gastrectomy next week       Darey Hershberger DAVID 09/26/2012, 11:30 AM

## 2012-09-30 NOTE — Progress Notes (Signed)
Comfortable.  Ambulatory.  HR 84, sats 97%  Abdomen appropriate. She looks good.

## 2012-10-01 ENCOUNTER — Encounter (HOSPITAL_COMMUNITY): Payer: Self-pay | Admitting: General Surgery

## 2012-10-01 LAB — CBC WITH DIFFERENTIAL/PLATELET
Basophils Absolute: 0 10*3/uL (ref 0.0–0.1)
Basophils Relative: 0 % (ref 0–1)
Eosinophils Absolute: 0 10*3/uL (ref 0.0–0.7)
Eosinophils Relative: 0 % (ref 0–5)
HCT: 37.6 % (ref 36.0–46.0)
MCH: 31.4 pg (ref 26.0–34.0)
MCHC: 33.8 g/dL (ref 30.0–36.0)
MCV: 93.1 fL (ref 78.0–100.0)
Monocytes Absolute: 1.2 10*3/uL — ABNORMAL HIGH (ref 0.1–1.0)
RDW: 13.2 % (ref 11.5–15.5)

## 2012-10-01 LAB — COMPREHENSIVE METABOLIC PANEL
ALT: 31 U/L (ref 0–35)
Albumin: 3.3 g/dL — ABNORMAL LOW (ref 3.5–5.2)
Alkaline Phosphatase: 70 U/L (ref 39–117)
Chloride: 104 mEq/L (ref 96–112)
Potassium: 3.8 mEq/L (ref 3.5–5.1)
Sodium: 138 mEq/L (ref 135–145)
Total Bilirubin: 0.3 mg/dL (ref 0.3–1.2)
Total Protein: 6 g/dL (ref 6.0–8.3)

## 2012-10-01 MED ORDER — OXYCODONE-ACETAMINOPHEN 5-325 MG/5ML PO SOLN: 5.0000 mL | ORAL | Status: DC | PRN

## 2012-10-01 MED ORDER — BUPROPION HCL 75 MG PO TABS: 150.0000 mg | ORAL_TABLET | Freq: Three times a day (TID) | ORAL | Status: DC

## 2012-10-01 MED ORDER — ONDANSETRON 4 MG PO TBDP: 4.0000 mg | ORAL_TABLET | Freq: Three times a day (TID) | ORAL | Status: DC | PRN

## 2012-10-01 NOTE — Progress Notes (Signed)
1 Day Post-Op  Subjective: Feels well. No nausea and minimal pain  Objective: Vital signs in last 24 hours: Temp:  [92 F (33.3 C)-98.9 F (37.2 C)] 98.9 F (37.2 C) (12/24 0530) Pulse Rate:  [71-102] 80  (12/24 0530) Resp:  [10-18] 18  (12/24 0530) BP: (123-171)/(79-103) 133/86 mmHg (12/24 0530) SpO2:  [93 %-100 %] 94 % (12/24 0530) Last BM Date: 09/29/12  Intake/Output from previous day: 12/23 0701 - 12/24 0700 In: 2368.8 [I.V.:2368.8] Out: 3785 [Urine:3475; Drains:285; Blood:25] Intake/Output this shift:    General appearance: alert, cooperative and no distress Resp: nonlabored Cardio: normal rate, regular rhythm GI: soft, minimal incisional tenderness, ND, wounds without infection, JP ss Extremities: SCD's bilat  Lab Results:   [facility] 10/01/12 0440  WBC 12.4*  HGB 12.7  HCT 37.6  PLT 349   BMET  Basename 10/01/12 0440  NA 138  K 3.8  CL 104  CO2 28  GLUCOSE 104*  BUN 7  CREATININE 0.77  CALCIUM 9.2   PT/INR No results found for this basename: LABPROT:2,INR:2 in the last 72 hours ABG No results found for this basename: PHART:2,PCO2:2,PO2:2,HCO3:2 in the last 72 hours  Studies/Results: No results found.  Anti-infectives: Anti-infectives     Start     Dose/Rate Route Frequency Ordered Stop   09/30/12 0545   ertapenem (INVANZ) 1 g in sodium chloride 0.9 % 50 mL IVPB        1 g 100 mL/hr over 30 Minutes Intravenous On call to O.R. 09/30/12 0545 09/30/12 0729          Assessment/Plan: s/p Procedure(s) (LRB) with comments: LAPAROSCOPIC GASTRIC SLEEVE RESECTION (N/A) UPPER GI ENDOSCOPY () she looks and feels great.  will advance to clears today and slowly advance.  continue to mobilize.  no evidence of any postop complications. I reviewed her postop instructions.  She should be ready for discharge to home tomorrow if tolerating diet.  LOS: 1 day    Lodema Pilot DAVID 10/01/2012

## 2012-10-01 NOTE — Progress Notes (Signed)
Pt alert and oriented; VSS; denies any nausea or vomiting; tolerating water well; will advance diet per Dr. Biagio Quint; denies burping or flatus at this time; denies BM; voiding without difficulty; ambulating in hallways without difficulty; using incentive spirometer as directed; pt already has follow up appts with [facility] and [facility]; aware of support group and BELt program; discharge instructions given to pt to review; questions answered.  GASTRIC BYPASS/SLEEVE DISCHARGE INSTRUCTIONS  Drs. Fredrik Rigger, [place], [place], and [place] Call if you have any problems.   Call [phone] and ask for the surgeon on call.    If you need immediate assistance come to the ER at [facility]. Tell the ER personnel that you are a new post-op gastric bypass patient. Signs and symptoms to report:   Severe vomiting or nausea. If you cannot tolerate clear liquids for longer than 1 day, you need to call your surgeon.    Abdominal pain which does not get better after taking your pain medication   Fever greater than 101 F degree   Difficulty breathing   Chest pain    Redness, swelling, drainage, or foul odor at incision sites    If your incisions open or pull apart   Swelling or pain in calf (lower leg)   Diarrhea, frequent watery, uncontrolled bowel movements.   Constipation, (no bowel movements for 3 days) if this occurs, Take Milk of Magnesia, 2 tablespoons by mouth, 3 times a day for 2 days if needed.  Call your doctor if constipation continues. Stop taking Milk of Magnesia once you have had a bowel movement. You may also use Miralax according to the label instructions.   Anything you consider "abnormal for you".   Normal side effects after Surgery:   Unable to sleep at night or concentrate   Irritability   Being tearful (crying) or depressed   These are common complaints, possibly related to your anesthesia, stress of surgery and change in lifestyle, that usually go away a few weeks after surgery.  If these  feelings continue, call your medical doctor.  Wound Care You may have surgical glue, steri-strips, or staples over your incisions after surgery.  Surgical glue:  Looks like a clear film over your incisions and will wear off gradually. Steri-strips: Strips of tape over your incisions. You may notice a yellowish color on the skin underneath the steri-strips. This is a substance used to make the steri-strips stick better. Do not pull the steri-strips off - let them fall off.  Staples: Cherlynn Polo may be removed before you leave the hospital. If you go home with staples, call [facility] [phone]) for an appointment with your surgeon's nurse to have staples removed in 7 - 10 days. Showering: You may shower two days after your surgery unless otherwise instructed by your surgeon. Wash gently around wounds with warm soapy water, rinse well, and gently pat dry.  If you have a drain, you may need someone to hold this while you shower. Avoid tub baths until staples are removed and incisions are healed.    Medications   Medications should be liquid or crushed if larger than the size of a dime.  Extended release pills should not be crushed.   Depending on the size and number of medications you take, you may need to stagger/change the time you take your medications so that you do not over-fill your pouch.    Make sure you follow-up with your primary care physician to make medication adjustments needed during rapid weight loss  and life-style adjustment.   If you are diabetic, follow up with the doctor that prescribes your diabetes medication(s) within one week after surgery and check your blood sugar regularly.   Do not drive while taking narcotics!   Do not take acetaminophen (Tylenol) and Roxicet or Lortab Elixir at the same time since these pain medications contain acetaminophen.  Diet at home: (First 2 Weeks) You will see the nutritionist two weeks after your surgery. She will advance your  diet if you are tolerating liquids well. Once at home, if you have severe vomiting or nausea and cannot tolerate clear liquids lasting longer than 1 day, call your surgeon.  Begin high protein shake 2 ounces every 3 hours, 5 - 6 times per day.  Gradually increase the amount you drink as tolerated.  You may find it easier to slowly sip shakes throughout the day.  It is important to get your proteins in first.   Protein Shake   Drink at least 2 ounces of shake 5-6 times per day   Each serving of protein shakes should have a minimum of 15 grams of protein and no more than 5 grams of carbohydrate    Increase the amount of protein shake you drink as tolerated   Protein powder may be added to fluids such as non-fat milk or Lactaid milk (limit to 20 grams added protein powder per serving   The initial goal is to drink at least 8 ounces of protein shake/drink per day (or as directed by the nutritionist). Some examples of protein shakes are [employer], [employer], EAS Edge HP, and [employer]jury. Hydration   Gradually increase the amount of water and other liquids as tolerated (See Acceptable Fluids)   Gradually increase the amount of protein shake as tolerated     Sip fluids slowly and throughout the day   May use Sugar substitutes, use sparingly (limit to 6 - 8 packets per day). Your fluid goal is 64 ounces of fluid daily. It may take a few weeks to build up to this.         32 oz (or more) should be clear liquids and 32 oz (or more) should be full liquids.         Liquids should not contain sugar, caffeine, or carbonation! Acceptable Fluids Clear Liquids:   Water or Sugar-free flavored water, Fruit H2O   Decaffeinated coffee or tea (sugar-free)   Crystal Lite, Wyler's Lite, Minute Maid Lite   Sugar-free Jell-O   Bouillon or broth   Sugar-free Popsicle:   *Less than 20 calories each; Limit 1 per day   Full Liquids:              Protein Shakes/Drinks + 2 choices per day of other full liquids  shown below.    Other full liquids must be: No more than 12 grams of Carbs per serving,  No more than 3 grams of Fat per serving   Strained low-fat cream soup   Non-Fat milk   Fat-free Lactaid Milk   Sugar-free yogurt (Dannon Lite & Fit) Vitamins and Minerals (Start 1 day after surgery unless otherwise directed)   2 Chewable Multivitamin / Multimineral Supplement (i.e. Centrum for Adults)   Chewable Calcium Citrate with Vitamin D-3. Take 1500 mg each day.           (Example: 3 Chewable Calcium Plus 600 with Vitamin D-3 can be found at [facility])         Vitamin B-12, 350 - 500  micrograms (oral tablet) each day   Do not mix multivitamins containing iron with calcium supplements; take 2 hours   apart   Do not substitute Tums (calcium carbonate) for your calcium   Menstruating women and those at risk for anemia may need extra iron. Talk with your doctor to see if you need additional iron.    If you need extra iron:  Total daily Iron recommendations (including Vitamins) = 50 - 100 mg Iron/day Do not stop taking or change any vitamins or minerals until you talk to your nutritionist or surgeon. Your nutritionist and / or physician must approve all vitamin and mineral supplements. Exercise For maximum success, begin exercising as soon as your doctor recommends. Make sure your physician approves any physical activity.   Depending on fitness level, begin with a simple walking program   Walk 5-15 minutes each day, 7 days per week.    Slowly increase until you are walking 30-45 minutes per day   Consider joining our BELT program. [phone] or email belt@[employer] .edu Things to remember:    You may have sexual relations when you feel comfortable. It is VERY important for female patients to use a reliable birth control method. Fertility often increases after surgery. Do not get pregnant for at least 18 months.   It is very important to keep all follow up appointments with your surgeon, nutritionist, primary  care physician, and behavioral health practitioner. After the first year, please follow up with your bariatric surgeon at least once a year in order to maintain best weight loss results.  [facility]: [phone] [facility]: [phone]   Free counseling is available for you and your family through collaboration between [facility] and [place]. Please call [phone] and leave a message.    Consider purchasing a medical alert bracelet that says you had gastric bypass surgery.    [facility] has a free Bariatric Surgery Support Group that meets monthly, the 3rd Thursday, 6 pm, Classroom #1, [place]. You may register online at [email or web address], but registration is not necessary. Select Classes and Support Groups, Bariatric Surgery, or Call [phone]   Do not return to work or drive until cleared by your surgeon   Use your CPAP when sleeping if applicable   Do not lift anything greater than ten pounds for at least two weeks  Joen Laura, RN BAriatric Nurse Coordinator

## 2012-10-01 NOTE — Care Management Note (Signed)
    Page 1 of 1   10/01/2012     12:05:53 PM   CARE MANAGEMENT NOTE 10/01/2012  Patient:  SECRET, KRISTENSEN   Account Number:  [id number]  Date Initiated:  10/01/2012  Documentation initiated by:  Lorenda Ishihara  Subjective/Objective Assessment:   56 yo female admitted s/p Laparoscopic vertical sleeve gastrectomy with laparoscopic hiatal hernia repair. PTA lived at home with spouse.     Action/Plan:   Home when stable.   Anticipated DC Date:  10/02/2012   Anticipated DC Plan:  HOME/SELF CARE      DC Planning Services  CM consult      Choice offered to / List presented to:             Status of service:  Completed, signed off Medicare Important Message given?   (If response is "NO", the following Medicare IM given date fields will be blank) Date Medicare IM given:   Date Additional Medicare IM given:    Discharge Disposition:  HOME/SELF CARE  Per UR Regulation:  Reviewed for med. necessity/level of care/duration of stay  If discussed at Long Length of Stay Meetings, dates discussed:    Comments:

## 2012-10-02 NOTE — Discharge Summary (Signed)
   Patient ID: [name] [id number] 56 y.o. 02/03/1956  09/30/2012  Discharge date and time: 10/02/2012   Admitting Physician: Lodema Pilot  Discharge Physician: Glenna Fellows T  Admission Diagnoses: morbid obesity   Discharge Diagnoses: Same  Operations: Procedure(s): LAPAROSCOPIC GASTRIC SLEEVE RESECTION UPPER GI ENDOSCOPY  Admission Condition: good  Discharged Condition: good  Indication for Admission: Pt with morbid obesity admitted for elective sleeve gastrectomy  Hospital Course: patient was admitted on the morning of her procedure and underwent an uneventful laparoscopic sleeve gastrectomy. There were no postoperative complications. On the morning of discharge she is afebrile with normal vital signs. She is having no pain and tolerating her protein shakes without nausea. Abdomen is soft and nontender. Incisions are clean and dry and healing well.   Disposition: Home  Patient Instructions:   [name], [name]  Home Medication Instructions NWG:[id number]   Printed on:10/02/12 0822  Medication Information                    traMADol (ULTRAM) 50 MG tablet Take 2 tablets (100 mg total) by mouth every 8 (eight) hours as needed for pain.           pantoprazole (PROTONIX) 40 MG tablet Take 40 mg by mouth daily.           Multiple Vitamin (MULTIVITAMIN WITH MINERALS) TABS Take 1 tablet by mouth daily.           Omega-3 Fatty Acids (FISH OIL) 1200 MG CAPS Take 1 capsule by mouth 2 (two) times daily.           oxyCODONE-acetaminophen (ROXICET) 5-325 MG/5ML solution Take 5-10 mLs by mouth every 4 (four) hours as needed.           ondansetron (ZOFRAN ODT) 4 MG disintegrating tablet Take 1 tablet (4 mg total) by mouth every 8 (eight) hours as needed for nausea.           buPROPion (WELLBUTRIN) 75 MG tablet Take 2 tablets (150 mg total) by mouth 3 (three) times daily.             Activity: activity as tolerated Diet: protein shakes per bariatric  instruction Wound Care: none needed  Follow-up:  With Dr Biagio Quint in 3 weeks.  Signed: Mariella Saa MD, FACS  10/02/2012, 8:22 AM

## 2012-10-04 ENCOUNTER — Ambulatory Visit (INDEPENDENT_AMBULATORY_CARE_PROVIDER_SITE_OTHER): Payer: BC Managed Care – PPO | Admitting: General Surgery

## 2012-10-04 ENCOUNTER — Encounter: Payer: Self-pay | Admitting: Internal Medicine

## 2012-10-08 ENCOUNTER — Encounter: Payer: Self-pay | Admitting: Internal Medicine

## 2012-10-08 MED ORDER — OXYCODONE-ACETAMINOPHEN 5-325 MG/5ML PO SOLN: 5.0000 mL | ORAL | Status: DC | PRN

## 2012-10-10 ENCOUNTER — Telehealth (INDEPENDENT_AMBULATORY_CARE_PROVIDER_SITE_OTHER): Payer: Self-pay

## 2012-10-10 NOTE — Telephone Encounter (Signed)
Patient given post op appointment for Thursday October 24, 2012 @ 11:55 am w/Dr. Biagio Quint

## 2012-10-15 ENCOUNTER — Encounter: Payer: BC Managed Care – PPO | Attending: General Surgery | Admitting: *Deleted

## 2012-10-15 VITALS — Ht 64.0 in | Wt 263.6 lb

## 2012-10-15 DIAGNOSIS — Z01818 Encounter for other preprocedural examination: Secondary | ICD-10-CM | POA: Insufficient documentation

## 2012-10-15 DIAGNOSIS — Z713 Dietary counseling and surveillance: Secondary | ICD-10-CM | POA: Insufficient documentation

## 2012-10-15 NOTE — Patient Instructions (Signed)
Patient to follow Phase 3A-Soft, High Protein Diet and follow-up at [facility] in 6 weeks for 2 months post-op nutrition visit for diet advancement. 

## 2012-10-16 ENCOUNTER — Encounter: Payer: Self-pay | Admitting: *Deleted

## 2012-10-16 NOTE — Progress Notes (Signed)
Bariatric Class:  Appt start time: 1600 end time:  1700.  2 Week Post-Operative Nutrition Class  Patient was seen on 10/15/12 for Post-[facility]ative Nutrition [facility]   Surgery date: 09/30/12  Surgery type: Gastric Sleeve  [facility] weight at [facility]: 281.8 lbs (08/08/12)   Weight today: 263.6 lbs Weight change: 18.6 lbs Total weight lost:  18.2 lbs BMI: 45.3  The following the learning objectives were met by the patient during this course:  Identifies Phase 3A (Soft, High Proteins) Dietary Goals and will begin from 2 weeks post-operatively to 2 months post-operatively  Identifies appropriate sources of fluids and proteins   States protein recommendations and appropriate sources post-operatively  Identifies the need for appropriate texture modifications, mastication, and bite sizes when consuming solids  Identifies appropriate multivitamin and calcium sources post-operatively  Describes the need for physical activity post-operatively and will follow MD recommendations  States when to call healthcare provider regarding medication questions or post-operative complications  Handouts given during class include:  Phase 3A: Soft, High Protein Diet Handout  Follow-Up Plan: Patient will follow-up at [facility] in 6 weeks for 2 months post-op nutrition visit for diet advancement per MD.

## 2012-10-17 ENCOUNTER — Encounter: Payer: Self-pay | Admitting: Internal Medicine

## 2012-10-17 MED ORDER — OXYCODONE-ACETAMINOPHEN 5-325 MG/5ML PO SOLN: 5.0000 mL | ORAL | Status: DC | PRN

## 2012-10-24 ENCOUNTER — Ambulatory Visit (INDEPENDENT_AMBULATORY_CARE_PROVIDER_SITE_OTHER): Payer: BC Managed Care – PPO | Admitting: General Surgery

## 2012-10-24 ENCOUNTER — Encounter (INDEPENDENT_AMBULATORY_CARE_PROVIDER_SITE_OTHER): Payer: Self-pay | Admitting: General Surgery

## 2012-10-24 VITALS — BP 132/68 | HR 84 | Temp 97.2°F | Resp 12 | Ht 64.0 in | Wt 257.2 lb

## 2012-10-24 DIAGNOSIS — Z4889 Encounter for other specified surgical aftercare: Secondary | ICD-10-CM

## 2012-10-24 DIAGNOSIS — Z5189 Encounter for other specified aftercare: Secondary | ICD-10-CM

## 2012-10-24 NOTE — Progress Notes (Signed)
Subjective:     Patient ID: [name], female   DOB: [birth year], 57 y.o.   MRN: [id number]  HPI This patient follows up 3 weeks status post laparoscopic vertical sleeve gastrectomy for obesity. She's doing well and has no complaints. She has lost about 15 pounds since her procedure and has no food intolerances. She is taking her vitamins and protein supplements and has no pain. She says that she is too fast she will get some nausea but has not had any vomiting. She is scheduled for hip surgery in 3 weeks.  Review of Systems     Objective:   Physical Exam No distress nontoxic-appearing comfortably in a chair. Her abdomen is soft and nontender and exam her incisions are well-healed without signs of infection.    Assessment:     Status post vertical sleeve gastrectomy-doing well She is doing very well from her procedure and has no apparent computations. I think her weight loss is appropriate given her lack of mobility. I would like to see that she increase her weight loss I think that this will only, as she increases her activity. I think it will be okay for her to proceed with hip surgery and if this will allow her to be more physically active that I would be in favor of this. She may be a high risk for blood clots and she should be on anticoagulation for this preoperatively. She will be careful about what she takes it and gradually increase her activity as tolerated and I will see her back in about 2 months. I will not check her hemoglobin today is since she will have this done perioperatively. She has no evidence of any vitamin deficiencies or problems.    Plan:     followup in 2 months. Increased activity as tolerated.

## 2012-10-25 ENCOUNTER — Ambulatory Visit (INDEPENDENT_AMBULATORY_CARE_PROVIDER_SITE_OTHER): Payer: BC Managed Care – PPO | Admitting: Internal Medicine

## 2012-10-25 ENCOUNTER — Encounter: Payer: Self-pay | Admitting: Internal Medicine

## 2012-10-25 VITALS — BP 133/87 | HR 81 | Temp 98.4°F | Wt 260.0 lb

## 2012-10-25 DIAGNOSIS — M1610 Unilateral primary osteoarthritis, unspecified hip: Secondary | ICD-10-CM

## 2012-10-25 DIAGNOSIS — Z6841 Body Mass Index (BMI) 40.0 and over, adult: Secondary | ICD-10-CM

## 2012-10-25 DIAGNOSIS — M169 Osteoarthritis of hip, unspecified: Secondary | ICD-10-CM

## 2012-10-25 DIAGNOSIS — F329 Major depressive disorder, single episode, unspecified: Secondary | ICD-10-CM

## 2012-10-25 DIAGNOSIS — M1612 Unilateral primary osteoarthritis, left hip: Secondary | ICD-10-CM

## 2012-10-25 DIAGNOSIS — F32A Depression, unspecified: Secondary | ICD-10-CM

## 2012-10-25 DIAGNOSIS — F3289 Other specified depressive episodes: Secondary | ICD-10-CM

## 2012-10-25 MED ORDER — BUPROPION HCL ER (SR) 150 MG PO TB12: 150.0000 mg | ORAL_TABLET | Freq: Two times a day (BID) | ORAL | Status: DC

## 2012-10-25 MED ORDER — OXYCODONE-ACETAMINOPHEN 5-325 MG/5ML PO SOLN: 10.0000 mL | ORAL | Status: DC | PRN

## 2012-10-25 MED ORDER — TRAMADOL HCL 50 MG PO TABS: 100.0000 mg | ORAL_TABLET | Freq: Three times a day (TID) | ORAL | Status: DC | PRN

## 2012-10-25 NOTE — Progress Notes (Signed)
Subjective:    Patient ID: [name], female    DOB: [birth year], 56 y.o.   MRN: [id number]  HPI 57 year old female with history of obesity, depression, osteoarthritis of the left hip presents for followup. In the interim since her last visit, she underwent gastric bypass surgery. She has lost approximately 13 pounds. She reports that she is feeling well. She denies abdominal pain, nausea, diarrhea. She is tolerating diet well. She continues to be unable to exercise because of severe left hip pain. She is now scheduled for left hip replacement on 11/13/2012. She continues to use tramadol and Roxicet as needed for severe pain. She also uses a cane or [name] for stability. She reports that symptoms of depression have been well-controlled with Wellbutrin. She denies any new concerns today.  Outpatient Encounter Prescriptions as of 10/25/2012  Medication Sig Dispense Refill  . Multiple Vitamin (MULTIVITAMIN WITH MINERALS) TABS Take 1 tablet by mouth daily.      . Omega-3 Fatty Acids (FISH OIL) 1200 MG CAPS Take 1 capsule by mouth 2 (two) times daily.      . ondansetron (ZOFRAN ODT) 4 MG disintegrating tablet Take 1 tablet (4 mg total) by mouth every 8 (eight) hours as needed for nausea.  20 tablet  0  . oxyCODONE-acetaminophen (ROXICET) 5-325 MG/5ML solution Take 10 mLs by mouth every 4 (four) hours as needed.  500 mL  0  . pantoprazole (PROTONIX) 40 MG tablet Take 40 mg by mouth daily.      . traMADol (ULTRAM) 50 MG tablet Take 2 tablets (100 mg total) by mouth every 8 (eight) hours as needed for pain.  90 tablet  3  . [DISCONTINUED] traMADol (ULTRAM) 50 MG tablet Take 2 tablets (100 mg total) by mouth every 8 (eight) hours as needed for pain.  90 tablet  3  . buPROPion (WELLBUTRIN SR) 150 MG 12 hr tablet Take 1 tablet (150 mg total) by mouth 2 (two) times daily.  60 tablet  6   BP 133/87  Pulse 81  Temp 98.4 F (36.9 C) (Oral)  Wt 260 lb (117.935 kg)  Review of Systems  Constitutional:  Negative for fever, chills, appetite change, fatigue and unexpected weight change.  HENT: Negative for ear pain, congestion, sore throat, trouble swallowing, neck pain, voice change and sinus pressure.   Eyes: Negative for visual disturbance.  Respiratory: Negative for cough, shortness of breath, wheezing and stridor.   Cardiovascular: Negative for chest pain, palpitations and leg swelling.  Gastrointestinal: Negative for nausea, vomiting, abdominal pain, diarrhea, constipation, blood in stool, abdominal distention and anal bleeding.  Genitourinary: Negative for dysuria and flank pain.  Musculoskeletal: Positive for myalgias, arthralgias and gait problem.  Skin: Negative for color change and rash.  Neurological: Negative for dizziness and headaches.  Hematological: Negative for adenopathy. Does not bruise/bleed easily.  Psychiatric/Behavioral: Negative for suicidal ideas, sleep disturbance and dysphoric mood. The patient is not nervous/anxious.        Objective:   Physical Exam  Constitutional: She is oriented to person, place, and time. She appears well-developed and well-nourished. No distress.  HENT:  Head: Normocephalic and atraumatic.  Right Ear: External ear normal.  Left Ear: External ear normal.  Nose: Nose normal.  Mouth/Throat: Oropharynx is clear and moist. No oropharyngeal exudate.  Eyes: Conjunctivae normal are normal. Pupils are equal, round, and reactive to light. Right eye exhibits no discharge. Left eye exhibits no discharge. No scleral icterus.  Neck: Normal range of motion. Neck supple. No tracheal  deviation present. No thyromegaly present.  Cardiovascular: Normal rate, regular rhythm, normal heart sounds and intact distal pulses.  Exam reveals no gallop and no friction rub.   No murmur heard. Pulmonary/Chest: Effort normal and breath sounds normal. No respiratory distress. She has no wheezes. She has no rales. She exhibits no tenderness.  Musculoskeletal: She exhibits  no edema and no tenderness.       Left hip: She exhibits decreased range of motion, decreased strength and tenderness.  Lymphadenopathy:    She has no cervical adenopathy.  Neurological: She is alert and oriented to person, place, and time. No cranial nerve deficit. She exhibits normal muscle tone. Coordination normal.  Skin: Skin is warm and dry. No rash noted. She is not diaphoretic. No erythema. No pallor.  Psychiatric: She has a normal mood and affect. Her behavior is normal. Judgment and thought content normal.          Assessment & Plan:

## 2012-10-25 NOTE — Assessment & Plan Note (Signed)
Severe osteoarthritis of the left hip, scheduled for hip replacement on 11/13/2012. Will continue Tramadol and Roxicet as needed for severe pain. Followup in 6 weeks or sooner as needed.

## 2012-10-25 NOTE — Assessment & Plan Note (Signed)
Symptoms generally well controlled with Wellbutrin. Will continue.

## 2012-10-25 NOTE — Assessment & Plan Note (Signed)
Status post gastric bypass. Congratulated patient on weight loss. Encouraged her to continue efforts at diet and plan to resume exercise program after hip replacement in 11/2012.

## 2012-10-28 ENCOUNTER — Ambulatory Visit: Payer: Self-pay | Admitting: General Practice

## 2012-10-28 LAB — APTT: Activated PTT: 31.1 secs (ref 23.6–35.9)

## 2012-10-28 LAB — URINALYSIS, COMPLETE
Bacteria: NONE SEEN
Bilirubin,UR: NEGATIVE
Blood: NEGATIVE
Glucose,UR: NEGATIVE mg/dL (ref 0–75)
Nitrite: NEGATIVE
Ph: 5 (ref 4.5–8.0)
RBC,UR: 3 /HPF (ref 0–5)
Specific Gravity: 1.044 (ref 1.003–1.030)
Squamous Epithelial: 1

## 2012-10-28 LAB — SEDIMENTATION RATE: Erythrocyte Sed Rate: 12 mm/hr (ref 0–30)

## 2012-10-28 LAB — MRSA PCR SCREENING

## 2012-10-31 LAB — URINE CULTURE

## 2012-11-01 ENCOUNTER — Ambulatory Visit (INDEPENDENT_AMBULATORY_CARE_PROVIDER_SITE_OTHER): Payer: BC Managed Care – PPO | Admitting: General Surgery

## 2012-11-05 ENCOUNTER — Encounter: Payer: Self-pay | Admitting: Internal Medicine

## 2012-11-05 DIAGNOSIS — M1612 Unilateral primary osteoarthritis, left hip: Secondary | ICD-10-CM

## 2012-11-05 MED ORDER — OXYCODONE-ACETAMINOPHEN 5-325 MG/5ML PO SOLN: 10.0000 mL | ORAL | Status: DC | PRN

## 2012-11-13 ENCOUNTER — Inpatient Hospital Stay: Payer: Self-pay | Admitting: General Practice

## 2012-11-13 HISTORY — PX: TOTAL HIP ARTHROPLASTY: SHX124

## 2012-11-14 LAB — BASIC METABOLIC PANEL WITH GFR
Anion Gap: 6 — ABNORMAL LOW
BUN: 12 mg/dL
Calcium, Total: 8 mg/dL — ABNORMAL LOW
Chloride: 110 mmol/L — ABNORMAL HIGH
Co2: 25 mmol/L
Creatinine: 0.79 mg/dL
EGFR (African American): >60
EGFR (Non-African Amer.): >60
Glucose: 131 mg/dL — ABNORMAL HIGH
Osmolality: 283
Potassium: 3.9 mmol/L
Sodium: 141 mmol/L

## 2012-11-15 LAB — BASIC METABOLIC PANEL
Anion Gap: 5 — ABNORMAL LOW (ref 7–16)
Calcium, Total: 8.5 mg/dL (ref 8.5–10.1)
Chloride: 108 mmol/L — ABNORMAL HIGH (ref 98–107)
EGFR (African American): >60
Osmolality: 280 (ref 275–301)
Sodium: 141 mmol/L (ref 136–145)

## 2012-11-16 LAB — CBC WITH DIFFERENTIAL/PLATELET
Eosinophil #: 0.2 10*3/uL (ref 0.0–0.7)
Eosinophil %: 1.2 %
HCT: 26.1 % — ABNORMAL LOW (ref 35.0–47.0)
HGB: 8.6 g/dL — ABNORMAL LOW (ref 12.0–16.0)
Lymphocyte #: 2.7 10*3/uL (ref 1.0–3.6)
Lymphocyte %: 19.9 %
MCH: 31.5 pg (ref 26.0–34.0)
MCHC: 33.1 g/dL (ref 32.0–36.0)
MCV: 95 fL (ref 80–100)
Monocyte #: 1.3 x10 3/mm — ABNORMAL HIGH (ref 0.2–0.9)
Platelet: 260 10*3/uL (ref 150–440)
RBC: 2.75 10*6/uL — ABNORMAL LOW (ref 3.80–5.20)
RDW: 13.8 % (ref 11.5–14.5)
WBC: 13.5 10*3/uL — ABNORMAL HIGH (ref 3.6–11.0)

## 2012-11-16 LAB — BASIC METABOLIC PANEL
Anion Gap: 8 (ref 7–16)
BUN: 9 mg/dL (ref 7–18)
EGFR (African American): >60
EGFR (Non-African Amer.): >60
Glucose: 128 mg/dL — ABNORMAL HIGH (ref 65–99)
Osmolality: 280 (ref 275–301)
Sodium: 140 mmol/L (ref 136–145)

## 2012-11-16 LAB — URINALYSIS, COMPLETE
Bilirubin,UR: NEGATIVE
Blood: NEGATIVE
Glucose,UR: NEGATIVE mg/dL (ref 0–75)
Ketone: NEGATIVE
Leukocyte Esterase: NEGATIVE
Nitrite: NEGATIVE
Ph: 6 (ref 4.5–8.0)
Protein: NEGATIVE
Squamous Epithelial: 1

## 2012-11-17 LAB — BASIC METABOLIC PANEL
BUN: 8 mg/dL (ref 7–18)
Chloride: 107 mmol/L (ref 98–107)
Co2: 27 mmol/L (ref 21–32)
Creatinine: 0.65 mg/dL (ref 0.60–1.30)
EGFR (Non-African Amer.): >60
Glucose: 109 mg/dL — ABNORMAL HIGH (ref 65–99)
Osmolality: 278 (ref 275–301)
Sodium: 140 mmol/L (ref 136–145)

## 2012-11-17 LAB — FOLATE: Folic Acid: 17.6 ng/mL (ref 3.1–100.0)

## 2012-11-17 LAB — IRON AND TIBC
Iron Saturation: 16 %
Unbound Iron-Bind.Cap.: 98 ug/dL

## 2012-11-17 LAB — FERRITIN: Ferritin (ARMC): 175 ng/mL (ref 8–388)

## 2012-11-18 ENCOUNTER — Telehealth: Payer: Self-pay | Admitting: Internal Medicine

## 2012-11-18 LAB — PATHOLOGY REPORT

## 2012-11-18 LAB — BASIC METABOLIC PANEL
BUN: 8 mg/dL (ref 7–18)
Chloride: 108 mmol/L — ABNORMAL HIGH (ref 98–107)
Creatinine: 0.62 mg/dL (ref 0.60–1.30)
EGFR (African American): >60
EGFR (Non-African Amer.): >60
Potassium: 4 mmol/L (ref 3.5–5.1)
Sodium: 140 mmol/L (ref 136–145)

## 2012-11-18 LAB — HEMOGLOBIN: HGB: 8.3 g/dL — ABNORMAL LOW (ref 12.0–16.0)

## 2012-11-18 NOTE — Telephone Encounter (Signed)
Hospital follow up 2.19.14 blood pressure and tachycardia.

## 2012-11-25 ENCOUNTER — Other Ambulatory Visit: Payer: Self-pay | Admitting: Internal Medicine

## 2012-11-25 DIAGNOSIS — D649 Anemia, unspecified: Secondary | ICD-10-CM

## 2012-11-26 NOTE — Telephone Encounter (Signed)
noted 

## 2012-11-27 ENCOUNTER — Ambulatory Visit: Payer: BC Managed Care – PPO | Admitting: Internal Medicine

## 2012-12-01 ENCOUNTER — Encounter: Payer: Self-pay | Admitting: Internal Medicine

## 2012-12-03 ENCOUNTER — Ambulatory Visit: Payer: BC Managed Care – PPO | Admitting: *Deleted

## 2012-12-06 ENCOUNTER — Encounter: Payer: BC Managed Care – PPO | Attending: General Surgery | Admitting: *Deleted

## 2012-12-06 ENCOUNTER — Encounter: Payer: Self-pay | Admitting: *Deleted

## 2012-12-06 VITALS — Ht 64.0 in | Wt 245.0 lb

## 2012-12-06 DIAGNOSIS — Z01818 Encounter for other preprocedural examination: Secondary | ICD-10-CM | POA: Insufficient documentation

## 2012-12-06 DIAGNOSIS — Z713 Dietary counseling and surveillance: Secondary | ICD-10-CM | POA: Insufficient documentation

## 2012-12-06 NOTE — Progress Notes (Addendum)
  Follow-up visit:  8 Weeks Post-Operative Gastric Sleeve Surgery  Medical Nutrition Therapy:  Appt start time: 0830 end time:  0900.  Primary concerns today: Post-operative Bariatric Surgery Nutrition Management. [name] returns for 2 mo f/u with additional 18.6 lb wt loss. Had hip surgery 3 weeks ago and reports excessive fluid retention. Since sx, states the thought of milk/milk products makes her nauseous, which has caused decreased protein intake. Some constipation and mild hair loss also reported. Has been doing PT exercises daily. Tracking dietary intake on My Fitness Pal (MFP).  Surgery date: 09/30/12  Surgery type: Gastric Sleeve  Start weight at [facility]: 281.8 lbs (08/08/12)   Weight today: 245.0 lbs Weight change: 18.6 lbs Total weight lost: 36.8 lbs  TANITA  BODY COMP RESULTS  12/06/12   BMI (kg/m^2) 42.1   Fat Mass (lbs) 117.5   Fat Free Mass (lbs) 127.5   Total Body Water (lbs) 93.5   24-hr recall: Only able to eat ~2 oz protein per meal since hip surgery. Will provide dietary recall via MFP app. B (AM): TBD Snk (AM):   L (PM):  Snk (PM):   D (PM):  Snk (PM):   Fluid intake: Water, unsweet tea = 48-50 oz (d/t dairy intolerance) Estimated total protein intake:  45-55 g since hip surgery  Medications:  Reviewed and reconciled with patient Supplementation: Taking as directed  Using straws: No Drinking while eating: No Hair loss: Mild at this point Carbonated beverages: No N/V/D/C: Constipation r/t recent surgery and addition of iron  Dumping syndrome: None  Recent physical activity:  PT exercises daily   Progress Towards Goal(s):  In progress.  Handouts given during visit include:  Phase 3B: High Protein + Non-Starchy Vegetables   Nutritional Diagnosis:  Hart-3.3 Overweight/obesity related to past poor dietary habits and physical inactivity as evidenced by patient w/ recent Gastric Sleeve surgery following dietary guidelines for continued weight loss.     Intervention:  Nutrition education/diet advancement.  Monitoring/Evaluation:  Dietary intake, exercise, and body weight. Follow up in 1 months for 3 month post-op visit.

## 2012-12-06 NOTE — Patient Instructions (Addendum)
Goals:  Follow Phase 3B: High Protein + Non-Starchy Vegetables  Eat 3-6 small meals/snacks, every 3-5 hrs  Increase lean protein foods to meet 60-80g goal  Increase fluid intake to 64oz +  Avoid drinking 15 minutes before, during and 30 minutes after eating  Aim for >30 min of physical activity daily  Friend me on My Fitness Pal

## 2012-12-13 ENCOUNTER — Ambulatory Visit: Payer: BC Managed Care – PPO | Admitting: Internal Medicine

## 2012-12-19 ENCOUNTER — Encounter: Payer: Self-pay | Admitting: Internal Medicine

## 2012-12-19 ENCOUNTER — Ambulatory Visit (INDEPENDENT_AMBULATORY_CARE_PROVIDER_SITE_OTHER): Payer: BC Managed Care – PPO | Admitting: Internal Medicine

## 2012-12-19 VITALS — BP 100/70 | HR 105 | Temp 98.7°F | Wt 239.0 lb

## 2012-12-19 DIAGNOSIS — M1612 Unilateral primary osteoarthritis, left hip: Secondary | ICD-10-CM

## 2012-12-19 DIAGNOSIS — M1610 Unilateral primary osteoarthritis, unspecified hip: Secondary | ICD-10-CM

## 2012-12-19 DIAGNOSIS — E785 Hyperlipidemia, unspecified: Secondary | ICD-10-CM

## 2012-12-19 DIAGNOSIS — Z6841 Body Mass Index (BMI) 40.0 and over, adult: Secondary | ICD-10-CM

## 2012-12-19 DIAGNOSIS — R002 Palpitations: Secondary | ICD-10-CM

## 2012-12-19 DIAGNOSIS — D62 Acute posthemorrhagic anemia: Secondary | ICD-10-CM

## 2012-12-19 LAB — CBC WITH DIFFERENTIAL/PLATELET
Basophils Absolute: 0 10*3/uL (ref 0.0–0.1)
HCT: 36.8 % (ref 36.0–46.0)
Lymphocytes Relative: 35.1 % (ref 12.0–46.0)
Lymphs Abs: 2.2 10*3/uL (ref 0.7–4.0)
Monocytes Relative: 8.5 % (ref 3.0–12.0)
Neutrophils Relative %: 52.5 % (ref 43.0–77.0)
Platelets: 260 10*3/uL (ref 150.0–400.0)
RDW: 14.6 % (ref 11.5–14.6)

## 2012-12-19 LAB — COMPREHENSIVE METABOLIC PANEL
ALT: 18 U/L (ref 0–35)
AST: 16 U/L (ref 0–37)
Calcium: 9.7 mg/dL (ref 8.4–10.5)
Chloride: 105 mEq/L (ref 96–112)
Creatinine, Ser: 0.9 mg/dL (ref 0.4–1.2)
Total Bilirubin: 0.7 mg/dL (ref 0.3–1.2)

## 2012-12-19 LAB — TSH: TSH: 1.54 u[IU]/mL (ref 0.35–5.50)

## 2012-12-19 LAB — FERRITIN: Ferritin: 76.1 ng/mL (ref 10.0–291.0)

## 2012-12-19 LAB — LIPID PANEL
HDL: 52.4 mg/dL (ref >39.00)
VLDL: 22.2 mg/dL (ref 0.0–40.0)

## 2012-12-19 LAB — VITAMIN B12: Vitamin B-12: 1014 pg/mL — ABNORMAL HIGH (ref 211–911)

## 2012-12-19 NOTE — Assessment & Plan Note (Signed)
S/p 2x PRBC transfusion after left hip replacement. Having palpitations, decreased exercise tolerance. Will check CBC, ferritin today.

## 2012-12-19 NOTE — Assessment & Plan Note (Signed)
Palpitations, decreased exercise tolerance, new t-wave changes on EKG. Suspect demand ischemia after recent surgery and significant blood loss. Will check CBC, CMP, TSH, and troponin with labs today. Will set up cardiology evaluation.

## 2012-12-19 NOTE — Assessment & Plan Note (Signed)
Wt Readings from Last 3 Encounters:  12/19/12 239 lb (108.41 kg)  12/06/12 245 lb (111.131 kg)  10/25/12 260 lb (117.935 kg)   Congratulated pt on continued weight loss. Encouraged efforts at healthy diet and regular physical activity.

## 2012-12-19 NOTE — Progress Notes (Signed)
Subjective:    Patient ID: [name], female    DOB: [birth year], 57 y.o.   MRN: [id number]  HPI 57YO female with h/o obesity s/p gastric bypass surgery, osteoarthritis s/p left hip replacement in 11/2012 presents for follow up. Notes significant blood lost postoperatively. Requireed 2x PRBC transfusions. Now having symptoms of palpitations, described as forceful rapid heart beats and dyspnea with minimal exertion such as walking 138ft. No chest pain. Otherwise, feeling well. No pain at hip. Off pain meds. Completed physical therapy. No abdominal pain or food intolerance.  Outpatient Encounter Prescriptions as of 12/19/2012  Medication Sig Dispense Refill  . Aspirin (ASPIR-81 PO) Take 81 mg by mouth daily.      Marland Kitchen BIOTIN FORTE PO Take by mouth.      Marland Kitchen buPROPion (WELLBUTRIN SR) 150 MG 12 hr tablet Take 1 tablet (150 mg total) by mouth 2 (two) times daily.  60 tablet  6  . Multiple Vitamin (MULTIVITAMIN WITH MINERALS) TABS Take 1 tablet by mouth daily.      . Omega-3 Fatty Acids (FISH OIL) 1200 MG CAPS Take 1 capsule by mouth 2 (two) times daily.      . ondansetron (ZOFRAN ODT) 4 MG disintegrating tablet Take 1 tablet (4 mg total) by mouth every 8 (eight) hours as needed for nausea.  20 tablet  0  . pantoprazole (PROTONIX) 40 MG tablet Take 40 mg by mouth daily.       No facility-administered encounter medications on file as of 12/19/2012.   BP 100/70  Pulse 105  Temp(Src) 98.7 F (37.1 C) (Oral)  Wt 239 lb (108.41 kg)  BMI 41 kg/m2  SpO2 93%  Review of Systems  Constitutional: Negative for fever, chills, appetite change, fatigue and unexpected weight change.  HENT: Negative for ear pain, congestion, sore throat, trouble swallowing, neck pain, voice change and sinus pressure.   Eyes: Negative for visual disturbance.  Respiratory: Positive for shortness of breath. Negative for cough, wheezing and stridor.   Cardiovascular: Positive for palpitations. Negative for chest pain and leg swelling.   Gastrointestinal: Negative for nausea, vomiting, abdominal pain, diarrhea, constipation, blood in stool, abdominal distention and anal bleeding.  Genitourinary: Negative for dysuria and flank pain.  Musculoskeletal: Negative for myalgias, arthralgias and gait problem.  Skin: Negative for color change and rash.  Neurological: Negative for dizziness and headaches.  Hematological: Negative for adenopathy. Does not bruise/bleed easily.  Psychiatric/Behavioral: Negative for suicidal ideas, sleep disturbance and dysphoric mood. The patient is not nervous/anxious.        Objective:   Physical Exam  Constitutional: She is oriented to person, place, and time. She appears well-developed and well-nourished. No distress.  HENT:  Head: Normocephalic and atraumatic.  Right Ear: External ear normal.  Left Ear: External ear normal.  Nose: Nose normal.  Mouth/Throat: Oropharynx is clear and moist. No oropharyngeal exudate.  Eyes: Conjunctivae are normal. Pupils are equal, round, and reactive to light. Right eye exhibits no discharge. Left eye exhibits no discharge. No scleral icterus.  Neck: Normal range of motion. Neck supple. No tracheal deviation present. No thyromegaly present.  Cardiovascular: Regular rhythm, normal heart sounds and intact distal pulses.  Tachycardia present.  Exam reveals no gallop and no friction rub.   No murmur heard. Pulmonary/Chest: Effort normal. No accessory muscle usage. Not tachypneic. No respiratory distress. She has no decreased breath sounds. She has no wheezes. She has no rhonchi. She has no rales. She exhibits no tenderness.  Musculoskeletal: Normal range of motion.  She exhibits no edema and no tenderness.  Lymphadenopathy:    She has no cervical adenopathy.  Neurological: She is alert and oriented to person, place, and time. No cranial nerve deficit. She exhibits normal muscle tone. Coordination normal.  Skin: Skin is warm and dry. No rash noted. She is not  diaphoretic. No erythema. No pallor.  Psychiatric: She has a normal mood and affect. Her behavior is normal. Judgment and thought content normal.          Assessment & Plan:

## 2012-12-19 NOTE — Assessment & Plan Note (Addendum)
S/p left hip replacement. Pain symptoms well controlled. Not currently using any meds for pain. Completed physical therapy. Will continue to monitor.

## 2012-12-24 ENCOUNTER — Encounter: Payer: Self-pay | Admitting: Cardiovascular Disease

## 2012-12-24 ENCOUNTER — Ambulatory Visit (INDEPENDENT_AMBULATORY_CARE_PROVIDER_SITE_OTHER): Payer: BC Managed Care – PPO | Admitting: Cardiovascular Disease

## 2012-12-24 VITALS — BP 104/88 | HR 103 | Ht 64.0 in | Wt 242.0 lb

## 2012-12-24 DIAGNOSIS — Z6841 Body Mass Index (BMI) 40.0 and over, adult: Secondary | ICD-10-CM

## 2012-12-24 DIAGNOSIS — R000 Tachycardia, unspecified: Secondary | ICD-10-CM

## 2012-12-24 DIAGNOSIS — R002 Palpitations: Secondary | ICD-10-CM

## 2012-12-24 NOTE — Progress Notes (Signed)
[name] Date of Birth  12-23-1955       [facility] Office [address]  [address], [address] [place], [place]  [address]   [place], [place]  [address] [phone]     [phone]   Fax  [phone]    Fax [phone]  Problem List: 1. Palpitations 2. Morbid obesity- -status post gastric sleeve laproscopic surgery 3. Gastroesophageal reflux disease  History of Present Illness:  [name] is a 57 yo with the above noted medical history.  She presents with episodes of tachypalpitations.   She has noticed that her resting HR is around 90.  She has had gastric sleeve surgery, left hip replacement.  She has noticed her fast HR since having these surgeries.  She never has CP or dyspnea.   She has never had any heart problems before.  The palpitations last 1-2 minutes.   These episodes occur almost every day.  The episodes scare her but do not cause any CP or dyspnea.  She has noticed that they occur more if she is dehydrated.  She has trouble staying hydrated since her gastric sleeve.   She is supposed to get 8 glasses of water in each day and has trouble doing this.   She has lost > 50 lbs since Dec. 2013.   She is doing her PT - walks 20-25 minutes a day.  She does not have palpitations while walking.    Current Outpatient Prescriptions on File Prior to Visit  Medication Sig Dispense Refill  . Aspirin (ASPIR-81 PO) Take 81 mg by mouth daily.      Marland Kitchen BIOTIN FORTE PO Take by mouth.      Marland Kitchen buPROPion (WELLBUTRIN SR) 150 MG 12 hr tablet Take 1 tablet (150 mg total) by mouth 2 (two) times daily.  60 tablet  6  . Multiple Vitamin (MULTIVITAMIN WITH MINERALS) TABS Take 1 tablet by mouth daily.      . Omega-3 Fatty Acids (FISH OIL) 1200 MG CAPS Take 1 capsule by mouth 2 (two) times daily.      . ondansetron (ZOFRAN ODT) 4 MG disintegrating tablet Take 1 tablet (4 mg total) by mouth every 8 (eight) hours as needed for nausea.  20 tablet  0  .  pantoprazole (PROTONIX) 40 MG tablet Take 40 mg by mouth daily.       No current facility-administered medications on file prior to visit.    No Known Allergies  Past Medical History  Diagnosis Date  . Morbid obesity   . Anxiety   . GERD (gastroesophageal reflux disease)   . Arthritis     left hip  . Hepatitis 1972    hep B  . PONV (postoperative nausea and vomiting)     Past Surgical History  Procedure Laterality Date  . Arthroscopic repair acl      bilateral knees  . Abdominal hysterectomy  04/1999  . Breast lumpectomy  06/1979; 03/1995    left  . Cholecystectomy  09/1987  . Laparoscopic gastric sleeve resection  09/30/2012    Procedure: LAPAROSCOPIC GASTRIC SLEEVE RESECTION;  Surgeon: Lodema Pilot, DO;  Location: [facility];  Service: General;  Laterality: N/A;  . Upper gi endoscopy  09/30/2012    Procedure: UPPER GI ENDOSCOPY;  Surgeon: Lodema Pilot, DO;  Location: [facility];  Service: General;;  . Total hip arthroplasty  11/13/2012    left    History  Smoking status  .  Former Smoker -- 0.25 packs/day for 5 years  . Types: Cigarettes  . Quit date: 07/25/2001  Smokeless tobacco  . Never Used    History  Alcohol Use No    Comment: rarely    Family History  Problem Relation Age of Onset  . Cancer Maternal Aunt     ovarian cancer  . Hyperlipidemia Mother   . Obesity Mother   . Cancer Father     Reviw of Systems:  Reviewed in the HPI.  All other systems are negative.  Physical Exam: Blood pressure 104/88, pulse 103, height 5\' 4"  (1.626 m), weight 242 lb (109.77 kg). General: Well developed, well nourished, in no acute distress.  Head: Normocephalic, atraumatic, sclera non-icteric, mucus membranes are moist,   Neck: Supple. Carotids are 2 + without bruits. No JVD   Lungs: Clear   Heart: RR, normal S1, S2,  Abdomen: Soft, non-tender, non-distended with normal bowel sounds.  Msk:  Strength and tone are normal   Extremities: No clubbing or cyanosis. No  edema.  Distal pedal pulses are 2+ and equal    Neuro: CN II - XII intact.  Alert and oriented X 3.   Psych:  Normal   ECG: December 24, 2012:  Sinus tachycardia.  Poor R wave progression - likely due to lead placement  Assessment / Plan:

## 2012-12-24 NOTE — Patient Instructions (Addendum)
Stay hydrated, push fluids. No medication changes were made today.  Follow up with Dr. Elease Hashimoto in 2 months.

## 2012-12-24 NOTE — Assessment & Plan Note (Addendum)
I suspect that she has sinus tachycardia associated with volume depletion.  The patient admits that she is not able to drink as much fluid as typically required.  We discussed various options including placing an event monitor on her, an echocardiogram, and forcing fluids. At this point she would like to try to drink lots of fluids to achieve a adequate level of hydration before we proceed with other testing.  I have suggested that she drink V-8.   She'll try to drink enough fluids this at this next week and see if that prevents the tachycardia. If she continues to have episodes of palpitations that we will schedule her for an echocardiogram and event monitor.  If she remains tachycardic, I think we should try a beta blocker.  At this point, her BP is marginal and I'm not sure she would tolerate it all that well.  I'm hoping that her BP increases and HR slows as she hydrates better.

## 2013-01-01 ENCOUNTER — Ambulatory Visit (INDEPENDENT_AMBULATORY_CARE_PROVIDER_SITE_OTHER): Payer: BC Managed Care – PPO | Admitting: General Surgery

## 2013-01-03 ENCOUNTER — Encounter: Payer: BC Managed Care – PPO | Attending: General Surgery | Admitting: *Deleted

## 2013-01-03 ENCOUNTER — Encounter: Payer: Self-pay | Admitting: *Deleted

## 2013-01-03 VITALS — Ht 64.0 in | Wt 240.5 lb

## 2013-01-03 DIAGNOSIS — Z01818 Encounter for other preprocedural examination: Secondary | ICD-10-CM | POA: Insufficient documentation

## 2013-01-03 DIAGNOSIS — Z713 Dietary counseling and surveillance: Secondary | ICD-10-CM | POA: Insufficient documentation

## 2013-01-03 NOTE — Patient Instructions (Addendum)
Goals:  Follow Phase 3B: High Protein + Non-Starchy Vegetables  Eat 3-6 small meals/snacks, every 3-5 hrs  Increase lean protein foods to meet 60-80g goal  Increase fluid intake to 64oz +  Add 15 grams of carbohydrate (fruit, whole grain, starchy vegetable) with meals  Avoid drinking 15 minutes before, during and 30 minutes after eating  Aim for >30 min of physical activity daily  

## 2013-01-03 NOTE — Progress Notes (Signed)
  Follow-up visit:  12 Weeks Post-Operative Gastric Sleeve Surgery  Medical Nutrition Therapy:  Appt start time: 1000  end time:  1045.  Primary concerns today: Post-operative Bariatric Surgery Nutrition Management. [name] returns for 3 mo f/u with additional 3.5 lb wt loss. Likely slowed d/t hip surgery a few weeks s/p sleeve. Reports she is only able to consume ~2 oz since hip surgery; protein intake down slightly. Was recently cleared by ortho to exercise and plans to incorporate as soon as possible. Using [employer].     Surgery date: 09/30/12  Surgery type: Gastric Sleeve  Start weight at [facility]: 281.8 lbs (08/08/12)   Weight today: 240.5 lbs Weight change: 4.5 lbs Total weight lost: 41.3 lbs  TANITA  BODY COMP RESULTS  12/06/12 01/03/13   BMI (kg/m^2) 42.1 41.3   Fat Mass (lbs) 117.5 121.0   Fat Free Mass (lbs) 127.5 119.5   Total Body Water (lbs) 93.5 87.5   24-hr recall: Only able to eat ~2 oz protein per meal since hip surgery. Will provide dietary recall via MFP app. B (8:30-9 AM): 8 oz Unjury shake w/ 2% lactaid OR greek yogurt - 28g Snk (AM):  NONE L (11:30 PM):  1/2 spinach tortilla w/ tuna salad (sometimes with cheese) Snk (PM):  4-5 Townhouse crackers w/ LF peanut butter D (PM): 3 oz lean protein Snk (PM): NONE or string cheese  Fluid intake: Water, 4 oz V8 juice, unsweet tea = 48-50 oz (d/t dairy intolerance) Estimated total protein intake:  45-55 g since hip surgery  Medications:  Reviewed and reconciled with patient Supplementation: Taking as directed  Using straws: No Drinking while eating: No Hair loss: Mild at this point Carbonated beverages: No N/V/D/C: Constipation r/t recent surgery and addition of iron  Dumping syndrome: None  Recent physical activity:  PT exercises daily   Progress Towards Goal(s):  In progress.   Nutritional Diagnosis:  Humble-3.3 Overweight/obesity related to past poor dietary habits and physical inactivity as evidenced by patient w/  recent Gastric Sleeve surgery following dietary guidelines for continued weight loss.    Intervention:  Nutrition education/diet advancement.  Monitoring/Evaluation:  Dietary intake, exercise, and body weight. Follow up in 3 months for 6 month post-op visit.

## 2013-01-13 ENCOUNTER — Ambulatory Visit (INDEPENDENT_AMBULATORY_CARE_PROVIDER_SITE_OTHER): Payer: BC Managed Care – PPO | Admitting: Internal Medicine

## 2013-01-13 ENCOUNTER — Encounter: Payer: Self-pay | Admitting: Internal Medicine

## 2013-01-13 VITALS — BP 102/70 | HR 97 | Temp 98.1°F | Wt 235.0 lb

## 2013-01-13 DIAGNOSIS — Z6841 Body Mass Index (BMI) 40.0 and over, adult: Secondary | ICD-10-CM

## 2013-01-13 DIAGNOSIS — R002 Palpitations: Secondary | ICD-10-CM

## 2013-01-13 DIAGNOSIS — E785 Hyperlipidemia, unspecified: Secondary | ICD-10-CM

## 2013-01-13 DIAGNOSIS — Z1211 Encounter for screening for malignant neoplasm of colon: Secondary | ICD-10-CM

## 2013-01-13 NOTE — Assessment & Plan Note (Signed)
Wt Readings from Last 3 Encounters:  01/13/13 235 lb (106.595 kg)  01/03/13 240 lb 8 oz (109.09 kg)  12/24/12 242 lb (109.77 kg)   Congratulated patient on ongoing weight loss. Encouraged her to continue efforts at healthy diet and regular physical activity. Followup in 3 months for physical exam.

## 2013-01-13 NOTE — Assessment & Plan Note (Signed)
Symptoms have resolved with increased fluid hydration. We'll continue to monitor.

## 2013-01-13 NOTE — Assessment & Plan Note (Signed)
Will schedule colonoscopy

## 2013-01-13 NOTE — Progress Notes (Signed)
Subjective:    Patient ID: [name], female    DOB: 1956-09-12, 57 y.o.   MRN: [id number]  HPI 57 year old female with history of obesity status post gastric sleeve procedure, osteoarthritis status post hip replacement presents for followup after recent episodes of palpitations. She was seen by cardiology and exam thought to be most consistent with sinus tachycardia from dehydration. She reports she has increased her fluid intake and symptoms seem to have resolved. She denies any chest pain, shortness of breath. She has occasional palpitations with significant exertion however this is rare. Aside from this, she reports she is doing well. She continues to lose weight after gastric sleeve procedure. She is now pain-free in her hips and is able to ambulate without any difficulty. Not currently using any pain medication.  Outpatient Encounter Prescriptions as of 01/13/2013  Medication Sig Dispense Refill  . Aspirin (ASPIR-81 PO) Take 81 mg by mouth daily.      Marland Kitchen BIOTIN FORTE PO Take 1 capsule by mouth 2 (two) times daily.       Marland Kitchen buPROPion (WELLBUTRIN SR) 150 MG 12 hr tablet Take 1 tablet (150 mg total) by mouth 2 (two) times daily.  60 tablet  6  . Calcium Carbonate-Vitamin D (CALCIUM 600/VITAMIN D) 600-400 MG-UNIT per chew tablet Chew 1 tablet by mouth 3 (three) times daily with meals.      . Multiple Vitamin (MULTIVITAMIN WITH MINERALS) TABS Take 1 tablet by mouth 2 (two) times daily.       . Omega-3 Fatty Acids (FISH OIL) 1200 MG CAPS Take 1 capsule by mouth 2 (two) times daily.      . pantoprazole (PROTONIX) 40 MG tablet Take 40 mg by mouth daily.      . traMADol (ULTRAM) 50 MG tablet       . meloxicam (MOBIC) 15 MG tablet       . ondansetron (ZOFRAN ODT) 4 MG disintegrating tablet Take 1 tablet (4 mg total) by mouth every 8 (eight) hours as needed for nausea.  20 tablet  0   No facility-administered encounter medications on file as of 01/13/2013.   BP 102/70  Pulse 97  Temp(Src) 98.1 F  (36.7 C) (Oral)  Wt 235 lb (106.595 kg)  BMI 40.32 kg/m2  SpO2 96%  Review of Systems  Constitutional: Negative for fever, chills, appetite change, fatigue and unexpected weight change.  HENT: Negative for ear pain, congestion, sore throat, trouble swallowing, neck pain, voice change and sinus pressure.   Eyes: Negative for visual disturbance.  Respiratory: Negative for cough, shortness of breath, wheezing and stridor.   Cardiovascular: Positive for palpitations. Negative for chest pain and leg swelling.  Gastrointestinal: Negative for nausea, vomiting, abdominal pain, diarrhea, constipation, blood in stool, abdominal distention and anal bleeding.  Genitourinary: Negative for dysuria and flank pain.  Musculoskeletal: Negative for myalgias, arthralgias and gait problem.  Skin: Negative for color change and rash.  Neurological: Negative for dizziness and headaches.  Hematological: Negative for adenopathy. Does not bruise/bleed easily.  Psychiatric/Behavioral: Negative for suicidal ideas, sleep disturbance and dysphoric mood. The patient is not nervous/anxious.        Objective:   Physical Exam  Constitutional: She is oriented to person, place, and time. She appears well-developed and well-nourished. No distress.  HENT:  Head: Normocephalic and atraumatic.  Right Ear: External ear normal.  Left Ear: External ear normal.  Nose: Nose normal.  Mouth/Throat: Oropharynx is clear and moist. No oropharyngeal exudate.  Eyes: Conjunctivae are normal. Pupils  are equal, round, and reactive to light. Right eye exhibits no discharge. Left eye exhibits no discharge. No scleral icterus.  Neck: Normal range of motion. Neck supple. No tracheal deviation present. No thyromegaly present.  Cardiovascular: Normal rate, regular rhythm, normal heart sounds and intact distal pulses.  Exam reveals no gallop and no friction rub.   No murmur heard. Pulmonary/Chest: Effort normal and breath sounds normal. No  accessory muscle usage. Not tachypneic. No respiratory distress. She has no decreased breath sounds. She has no wheezes. She has no rhonchi. She has no rales. She exhibits no tenderness.  Musculoskeletal: Normal range of motion. She exhibits no edema and no tenderness.  Lymphadenopathy:    She has no cervical adenopathy.  Neurological: She is alert and oriented to person, place, and time. No cranial nerve deficit. She exhibits normal muscle tone. Coordination normal.  Skin: Skin is warm and dry. No rash noted. She is not diaphoretic. No erythema. No pallor.  Psychiatric: She has a normal mood and affect. Her behavior is normal. Judgment and thought content normal.          Assessment & Plan:

## 2013-01-16 ENCOUNTER — Other Ambulatory Visit: Payer: Self-pay | Admitting: *Deleted

## 2013-01-16 DIAGNOSIS — F329 Major depressive disorder, single episode, unspecified: Secondary | ICD-10-CM

## 2013-01-16 DIAGNOSIS — F32A Depression, unspecified: Secondary | ICD-10-CM

## 2013-01-17 MED ORDER — PANTOPRAZOLE SODIUM 40 MG PO TBEC: 40.0000 mg | DELAYED_RELEASE_TABLET | Freq: Every day | ORAL | Status: DC

## 2013-01-17 MED ORDER — MELOXICAM 15 MG PO TABS: 15.0000 mg | ORAL_TABLET | Freq: Every day | ORAL | Status: DC

## 2013-01-20 ENCOUNTER — Other Ambulatory Visit: Payer: Self-pay | Admitting: *Deleted

## 2013-01-20 DIAGNOSIS — F32A Depression, unspecified: Secondary | ICD-10-CM

## 2013-01-20 DIAGNOSIS — F329 Major depressive disorder, single episode, unspecified: Secondary | ICD-10-CM

## 2013-01-20 MED ORDER — BUPROPION HCL ER (SR) 150 MG PO TB12: 150.0000 mg | ORAL_TABLET | Freq: Two times a day (BID) | ORAL | Status: DC

## 2013-01-20 MED ORDER — PANTOPRAZOLE SODIUM 40 MG PO TBEC: 40.0000 mg | DELAYED_RELEASE_TABLET | Freq: Every day | ORAL | Status: DC

## 2013-01-20 NOTE — Telephone Encounter (Deleted)
Refill Request  Venlafaxine HCL ER 150 MG Caps  # 90- day supply

## 2013-01-20 NOTE — Telephone Encounter (Signed)
Eprescribed.

## 2013-01-30 ENCOUNTER — Ambulatory Visit (INDEPENDENT_AMBULATORY_CARE_PROVIDER_SITE_OTHER): Payer: BC Managed Care – PPO | Admitting: General Surgery

## 2013-02-04 ENCOUNTER — Other Ambulatory Visit: Payer: Self-pay | Admitting: Internal Medicine

## 2013-02-04 MED ORDER — PANTOPRAZOLE SODIUM 40 MG PO TBEC: 40.0000 mg | DELAYED_RELEASE_TABLET | Freq: Every day | ORAL | Status: DC

## 2013-02-04 MED ORDER — MELOXICAM 15 MG PO TABS: 15.0000 mg | ORAL_TABLET | Freq: Every day | ORAL | Status: DC

## 2013-02-04 NOTE — Telephone Encounter (Signed)
Patient brought by refill request to be sent to [facility] . Request were given to [facility] in the lab.

## 2013-02-04 NOTE — Telephone Encounter (Signed)
Eprescribed.

## 2013-02-07 ENCOUNTER — Other Ambulatory Visit: Payer: Self-pay | Admitting: Internal Medicine

## 2013-02-07 NOTE — Telephone Encounter (Signed)
escript refilled 02/04/13.

## 2013-02-18 ENCOUNTER — Ambulatory Visit: Payer: BC Managed Care – PPO | Admitting: Cardiovascular Disease

## 2013-02-20 ENCOUNTER — Telehealth: Payer: Self-pay | Admitting: Internal Medicine

## 2013-02-20 ENCOUNTER — Encounter: Payer: Self-pay | Admitting: Internal Medicine

## 2013-02-20 NOTE — Telephone Encounter (Signed)
Have you seen this paperwork?

## 2013-02-20 NOTE — Telephone Encounter (Signed)
Pt called checking on her [facility] paperwork  Pt needs to have this turned in by 02/25/13 Please advise when this is faxed Pt dropped paperwork of a couple weeks ago

## 2013-02-20 NOTE — Telephone Encounter (Signed)
I have not seen any paperwork on [name]

## 2013-02-21 MED ORDER — MELOXICAM 15 MG PO TABS: 15.0000 mg | ORAL_TABLET | Freq: Every day | ORAL | Status: DC

## 2013-02-21 MED ORDER — PANTOPRAZOLE SODIUM 40 MG PO TBEC: 40.0000 mg | DELAYED_RELEASE_TABLET | Freq: Every day | ORAL | Status: DC

## 2013-02-21 NOTE — Telephone Encounter (Signed)
Prescriptions has been sent:

## 2013-02-21 NOTE — Telephone Encounter (Signed)
Patient is on her way over here to bring another copy of this paperwork. She stood there while someone made copies.

## 2013-02-21 NOTE — Telephone Encounter (Signed)
Gave copy to carolyn

## 2013-02-27 ENCOUNTER — Encounter: Payer: Self-pay | Admitting: Internal Medicine

## 2013-03-07 ENCOUNTER — Ambulatory Visit: Payer: BC Managed Care – PPO | Admitting: *Deleted

## 2013-08-14 ENCOUNTER — Other Ambulatory Visit: Payer: Self-pay

## 2013-08-15 ENCOUNTER — Encounter: Payer: Self-pay | Admitting: Internal Medicine

## 2013-08-15 DIAGNOSIS — F32A Depression, unspecified: Secondary | ICD-10-CM

## 2013-08-15 DIAGNOSIS — F329 Major depressive disorder, single episode, unspecified: Secondary | ICD-10-CM

## 2013-08-15 MED ORDER — BUPROPION HCL ER (SR) 150 MG PO TB12: 150.0000 mg | ORAL_TABLET | Freq: Two times a day (BID) | ORAL | Status: DC

## 2014-03-11 ENCOUNTER — Other Ambulatory Visit: Payer: Self-pay | Admitting: Physician Assistant

## 2014-03-12 LAB — URINE CULTURE

## 2014-03-18 ENCOUNTER — Encounter: Payer: BC Managed Care – PPO | Admitting: Internal Medicine

## 2014-03-23 ENCOUNTER — Encounter: Payer: Self-pay | Admitting: Internal Medicine

## 2014-03-23 ENCOUNTER — Telehealth: Payer: Self-pay | Admitting: Internal Medicine

## 2014-03-23 NOTE — Telephone Encounter (Signed)
I couldn't get this patient in at a 7:15 until August 18  . She is needing her medications refilled until that time.

## 2014-03-24 ENCOUNTER — Other Ambulatory Visit: Payer: Self-pay | Admitting: *Deleted

## 2014-03-24 NOTE — Telephone Encounter (Signed)
Pt requesting refills on Protonix and Wellbutrin, okay to refill?

## 2014-03-24 NOTE — Telephone Encounter (Signed)
Can she come in at 7am tomorrow?

## 2014-03-25 ENCOUNTER — Ambulatory Visit (INDEPENDENT_AMBULATORY_CARE_PROVIDER_SITE_OTHER): Payer: 59 | Admitting: Internal Medicine

## 2014-03-25 ENCOUNTER — Encounter: Payer: Self-pay | Admitting: Internal Medicine

## 2014-03-25 VITALS — BP 110/70 | HR 83 | Temp 98.2°F | Ht 64.0 in | Wt 226.0 lb

## 2014-03-25 DIAGNOSIS — F329 Major depressive disorder, single episode, unspecified: Secondary | ICD-10-CM

## 2014-03-25 DIAGNOSIS — Z1211 Encounter for screening for malignant neoplasm of colon: Secondary | ICD-10-CM

## 2014-03-25 DIAGNOSIS — K219 Gastro-esophageal reflux disease without esophagitis: Secondary | ICD-10-CM

## 2014-03-25 DIAGNOSIS — Z0000 Encounter for general adult medical examination without abnormal findings: Secondary | ICD-10-CM

## 2014-03-25 DIAGNOSIS — F3289 Other specified depressive episodes: Secondary | ICD-10-CM

## 2014-03-25 DIAGNOSIS — Z6841 Body Mass Index (BMI) 40.0 and over, adult: Secondary | ICD-10-CM

## 2014-03-25 DIAGNOSIS — Z1239 Encounter for other screening for malignant neoplasm of breast: Secondary | ICD-10-CM

## 2014-03-25 DIAGNOSIS — F32A Depression, unspecified: Secondary | ICD-10-CM

## 2014-03-25 MED ORDER — BUPROPION HCL ER (SR) 150 MG PO TB12: 150.0000 mg | ORAL_TABLET | Freq: Two times a day (BID) | ORAL | Status: DC

## 2014-03-25 MED ORDER — PANTOPRAZOLE SODIUM 40 MG PO TBEC: 40.0000 mg | DELAYED_RELEASE_TABLET | Freq: Every day | ORAL | Status: DC

## 2014-03-25 NOTE — Assessment & Plan Note (Signed)
Referral placed for colonoscopy. 

## 2014-03-25 NOTE — Progress Notes (Signed)
Pre visit review using our clinic review tool, if applicable. No additional management support is needed unless otherwise documented below in the visit note. 

## 2014-03-25 NOTE — Progress Notes (Signed)
Subjective:    Patient ID: [name], female    DOB: September 01, 1956, 58 y.o.   MRN: [id number]  HPI 58YO female presents for follow up. Doing well. No concerns today. Notes she was treated for UTI at urgent care last month. Working on following healthy diet. Has recently lost about 10lbs.  Review of Systems  Constitutional: Negative for fever, chills, appetite change, fatigue and unexpected weight change.  HENT: Negative for congestion, ear pain, sinus pressure, sore throat, trouble swallowing and voice change.   Eyes: Negative for visual disturbance.  Respiratory: Negative for cough, shortness of breath, wheezing and stridor.   Cardiovascular: Negative for chest pain, palpitations and leg swelling.  Gastrointestinal: Negative for nausea, vomiting, abdominal pain, diarrhea, constipation, blood in stool, abdominal distention and anal bleeding.  Genitourinary: Negative for dysuria and flank pain.  Musculoskeletal: Negative for arthralgias, gait problem, myalgias and neck pain.  Skin: Negative for color change and rash.  Neurological: Negative for dizziness and headaches.  Hematological: Negative for adenopathy. Does not bruise/bleed easily.  Psychiatric/Behavioral: Negative for suicidal ideas, sleep disturbance and dysphoric mood. The patient is not nervous/anxious.        Objective:    BP 110/70  Pulse 83  Temp(Src) 98.2 F (36.8 C) (Oral)  Ht 5\' 4"  (1.626 m)  Wt 226 lb (102.513 kg)  BMI 38.77 kg/m2  SpO2 96% Physical Exam  Constitutional: She is oriented to person, place, and time. She appears well-developed and well-nourished. No distress.  HENT:  Head: Normocephalic and atraumatic.  Right Ear: External ear normal.  Left Ear: External ear normal.  Nose: Nose normal.  Mouth/Throat: Oropharynx is clear and moist. No oropharyngeal exudate.  Eyes: Conjunctivae are normal. Pupils are equal, round, and reactive to light. Right eye exhibits no discharge. Left eye exhibits no  discharge. No scleral icterus.  Neck: Normal range of motion. Neck supple. No tracheal deviation present. No thyromegaly present.  Cardiovascular: Normal rate, regular rhythm, normal heart sounds and intact distal pulses.  Exam reveals no gallop and no friction rub.   No murmur heard. Pulmonary/Chest: Effort normal and breath sounds normal. No accessory muscle usage. Not tachypneic. No respiratory distress. She has no decreased breath sounds. She has no wheezes. She has no rhonchi. She has no rales. She exhibits no tenderness.  Musculoskeletal: Normal range of motion. She exhibits no edema and no tenderness.  Lymphadenopathy:    She has no cervical adenopathy.  Neurological: She is alert and oriented to person, place, and time. No cranial nerve deficit. She exhibits normal muscle tone. Coordination normal.  Skin: Skin is warm and dry. No rash noted. She is not diaphoretic. No erythema. No pallor.  Psychiatric: She has a normal mood and affect. Her behavior is normal. Judgment and thought content normal.          Assessment & Plan:   Problem List Items Addressed This Visit     Unprioritized   Depression     Symptoms well controlled on Wellbutrin. Will continue.    Relevant Medications      buPROPion (WELLBUTRIN SR) 12 hr tablet   Morbid obesity with BMI of 45.0-49.9, adult - Primary      Wt Readings from Last 3 Encounters:  03/25/14 226 lb (102.513 kg)  01/13/13 235 lb (106.595 kg)  01/03/13 240 lb 8 oz (109.09 kg)   Congratulated pt on weight loss. Encouraged continued efforts at healthy diet and exercise.    Special screening for malignant neoplasms, colon  Relevant Orders      Ambulatory referral to Gastroenterology    Other Visit Diagnoses   GERD (gastroesophageal reflux disease)        Relevant Medications       pantoprazole (PROTONIX) EC tablet    Routine general medical examination at a health care facility        Relevant Orders       CBC with Differential        Comprehensive metabolic panel       Lipid panel       Microalbumin / creatinine urine ratio       Vit D  25 hydroxy (rtn osteoporosis monitoring)       TSH       B12    Screening for breast cancer        Relevant Orders       MM Digital Screening        Return if symptoms worsen or fail to improve.

## 2014-03-25 NOTE — Assessment & Plan Note (Signed)
Symptoms well controlled on Wellbutrin. Will continue. 

## 2014-03-25 NOTE — Assessment & Plan Note (Signed)
Wt Readings from Last 3 Encounters:  03/25/14 226 lb (102.513 kg)  01/13/13 235 lb (106.595 kg)  01/03/13 240 lb 8 oz (109.09 kg)   Congratulated pt on weight loss. Encouraged continued efforts at healthy diet and exercise.

## 2014-03-26 ENCOUNTER — Encounter: Payer: Self-pay | Admitting: Internal Medicine

## 2014-03-30 ENCOUNTER — Other Ambulatory Visit: Payer: BC Managed Care – PPO

## 2014-03-30 ENCOUNTER — Encounter: Payer: BC Managed Care – PPO | Admitting: Internal Medicine

## 2014-03-31 ENCOUNTER — Other Ambulatory Visit (INDEPENDENT_AMBULATORY_CARE_PROVIDER_SITE_OTHER): Payer: 59

## 2014-03-31 DIAGNOSIS — Z0000 Encounter for general adult medical examination without abnormal findings: Secondary | ICD-10-CM

## 2014-03-31 LAB — VITAMIN B12: Vitamin B-12: >1500 pg/mL — ABNORMAL HIGH (ref 211–911)

## 2014-03-31 LAB — CBC WITH DIFFERENTIAL/PLATELET
BASOS ABS: 0.1 10*3/uL (ref 0.0–0.1)
Basophils Relative: 0.8 % (ref 0.0–3.0)
EOS PCT: 5.2 % — AB (ref 0.0–5.0)
Eosinophils Absolute: 0.4 10*3/uL (ref 0.0–0.7)
HEMATOCRIT: 37.9 % (ref 36.0–46.0)
Hemoglobin: 12.8 g/dL (ref 12.0–15.0)
LYMPHS ABS: 2.6 10*3/uL (ref 0.7–4.0)
Lymphocytes Relative: 34.4 % (ref 12.0–46.0)
MCHC: 33.8 g/dL (ref 30.0–36.0)
MCV: 94.1 fl (ref 78.0–100.0)
MONO ABS: 0.5 10*3/uL (ref 0.1–1.0)
MONOS PCT: 6.3 % (ref 3.0–12.0)
Neutro Abs: 4 10*3/uL (ref 1.4–7.7)
Neutrophils Relative %: 53.3 % (ref 43.0–77.0)
PLATELETS: 266 10*3/uL (ref 150.0–400.0)
RBC: 4.02 Mil/uL (ref 3.87–5.11)
RDW: 13.4 % (ref 11.5–15.5)
WBC: 7.6 10*3/uL (ref 4.0–10.5)

## 2014-03-31 LAB — MICROALBUMIN / CREATININE URINE RATIO
Creatinine,U: 113.8 mg/dL
MICROALB/CREAT RATIO: 0.2 mg/g (ref 0.0–30.0)
Microalb, Ur: 0.2 mg/dL (ref 0.0–1.9)

## 2014-03-31 LAB — COMPREHENSIVE METABOLIC PANEL
ALT: 14 U/L (ref 0–35)
AST: 16 U/L (ref 0–37)
Albumin: 3.7 g/dL (ref 3.5–5.2)
Alkaline Phosphatase: 66 U/L (ref 39–117)
BUN: 20 mg/dL (ref 6–23)
CALCIUM: 9.1 mg/dL (ref 8.4–10.5)
CHLORIDE: 107 meq/L (ref 96–112)
CO2: 29 mEq/L (ref 19–32)
CREATININE: 0.9 mg/dL (ref 0.4–1.2)
GFR: 72.33 mL/min (ref >60.00)
Glucose, Bld: 96 mg/dL (ref 70–99)
Potassium: 4.9 mEq/L (ref 3.5–5.1)
Sodium: 142 mEq/L (ref 135–145)
Total Bilirubin: 0.7 mg/dL (ref 0.2–1.2)
Total Protein: 6.2 g/dL (ref 6.0–8.3)

## 2014-03-31 LAB — LIPID PANEL
CHOL/HDL RATIO: 3
Cholesterol: 182 mg/dL (ref 0–200)
HDL: 53.6 mg/dL (ref >39.00)
LDL Cholesterol: 112 mg/dL — ABNORMAL HIGH (ref 0–99)
NONHDL: 128.4
Triglycerides: 80 mg/dL (ref 0.0–149.0)
VLDL: 16 mg/dL (ref 0.0–40.0)

## 2014-03-31 LAB — TSH: TSH: 2.13 u[IU]/mL (ref 0.35–4.50)

## 2014-03-31 LAB — VITAMIN D 25 HYDROXY (VIT D DEFICIENCY, FRACTURES): VITD: 32.34 ng/mL

## 2014-04-13 ENCOUNTER — Encounter: Payer: Self-pay | Admitting: Internal Medicine

## 2014-04-14 ENCOUNTER — Ambulatory Visit (INDEPENDENT_AMBULATORY_CARE_PROVIDER_SITE_OTHER): Payer: 59 | Admitting: Internal Medicine

## 2014-04-14 ENCOUNTER — Encounter: Payer: Self-pay | Admitting: Internal Medicine

## 2014-04-14 VITALS — BP 100/82 | HR 80 | Temp 97.6°F | Ht 64.0 in | Wt 225.0 lb

## 2014-04-14 DIAGNOSIS — R300 Dysuria: Secondary | ICD-10-CM

## 2014-04-14 DIAGNOSIS — N3000 Acute cystitis without hematuria: Secondary | ICD-10-CM

## 2014-04-14 LAB — POCT URINALYSIS DIPSTICK
BILIRUBIN UA: NEGATIVE
Blood, UA: NEGATIVE
Glucose, UA: NEGATIVE
Ketones, UA: NEGATIVE
LEUKOCYTES UA: NEGATIVE
NITRITE UA: POSITIVE
PH UA: 7
Protein, UA: NEGATIVE
Spec Grav, UA: 1.02
Urobilinogen, UA: 1

## 2014-04-14 MED ORDER — SULFAMETHOXAZOLE-TMP DS 800-160 MG PO TABS: 1.0000 | ORAL_TABLET | Freq: Two times a day (BID) | ORAL | Status: DC

## 2014-04-14 NOTE — Assessment & Plan Note (Signed)
Symptoms and UA c/w UTI. Will start Bactrim. Send urine for culture. Follow up if symptoms are not improving.

## 2014-04-14 NOTE — Progress Notes (Signed)
   Subjective:    Patient ID: [name], female    DOB: 1955-11-14, 58 y.o.   MRN: [id number]  HPI 58YO female presents for acute visit.  Saturday woke with dysuria, urgency. No hematuria. No fever, chills. No flank pain. Took Azo with some improvement in symptoms.   Review of Systems  Constitutional: Negative for fever, chills and fatigue.  Gastrointestinal: Negative for nausea, vomiting, abdominal pain, diarrhea, constipation and rectal pain.  Genitourinary: Positive for dysuria, urgency and frequency. Negative for hematuria, flank pain, decreased urine volume, vaginal bleeding, vaginal discharge, difficulty urinating, vaginal pain and pelvic pain.       Objective:    BP 100/82  Pulse 80  Temp(Src) 97.6 F (36.4 C) (Oral)  Ht 5\' 4"  (1.626 m)  Wt 225 lb (102.059 kg)  BMI 38.60 kg/m2  SpO2 97% Physical Exam  Constitutional: She is oriented to person, place, and time. She appears well-developed and well-nourished. No distress.  HENT:  Head: Normocephalic and atraumatic.  Right Ear: External ear normal.  Left Ear: External ear normal.  Nose: Nose normal.  Mouth/Throat: Oropharynx is clear and moist.  Eyes: Conjunctivae are normal. Pupils are equal, round, and reactive to light. Right eye exhibits no discharge. Left eye exhibits no discharge. No scleral icterus.  Neck: Normal range of motion. Neck supple. No tracheal deviation present. No thyromegaly present.  Cardiovascular: Normal rate, regular rhythm, normal heart sounds and intact distal pulses.  Exam reveals no gallop and no friction rub.   No murmur heard. Pulmonary/Chest: Effort normal and breath sounds normal. No accessory muscle usage. Not tachypneic. No respiratory distress. She has no decreased breath sounds. She has no wheezes. She has no rhonchi. She has no rales. She exhibits no tenderness.  Abdominal: There is no tenderness (no CVA tenderness).  Musculoskeletal: Normal range of motion. She exhibits no edema and  no tenderness.  Lymphadenopathy:    She has no cervical adenopathy.  Neurological: She is alert and oriented to person, place, and time. No cranial nerve deficit. She exhibits normal muscle tone. Coordination normal.  Skin: Skin is warm and dry. No rash noted. She is not diaphoretic. No erythema. No pallor.  Psychiatric: She has a normal mood and affect. Her behavior is normal. Judgment and thought content normal.          Assessment & Plan:   Problem List Items Addressed This Visit     Unprioritized   Acute cystitis without hematuria - Primary     Symptoms and UA c/w UTI. Will start Bactrim. Send urine for culture. Follow up if symptoms are not improving.    Relevant Medications      sulfamethoxazole-trimethoprim (BACTRIM/SEPTRA DS) tablet 800-160 mg   Other Relevant Orders      CULTURE, URINE COMPREHENSIVE    Other Visit Diagnoses   Dysuria        Relevant Orders       POCT Urinalysis Dipstick (Completed)        Return if symptoms worsen or fail to improve.

## 2014-04-14 NOTE — Progress Notes (Signed)
Pre visit review using our clinic review tool, if applicable. No additional management support is needed unless otherwise documented below in the visit note. 

## 2014-04-14 NOTE — Patient Instructions (Signed)
Start Bactrim twice daily.  Continue Azo.  Increase fluid intake.  Call if any persistent symptoms.

## 2014-04-16 LAB — CULTURE, URINE COMPREHENSIVE
COLONY COUNT: NO GROWTH
Organism ID, Bacteria: NO GROWTH

## 2014-05-25 ENCOUNTER — Encounter: Payer: Self-pay | Admitting: Internal Medicine

## 2014-05-26 ENCOUNTER — Encounter: Payer: BC Managed Care – PPO | Admitting: Internal Medicine

## 2014-06-01 ENCOUNTER — Encounter: Payer: BC Managed Care – PPO | Admitting: Internal Medicine

## 2014-06-02 ENCOUNTER — Ambulatory Visit: Payer: Self-pay | Admitting: Internal Medicine

## 2014-06-03 ENCOUNTER — Telehealth: Payer: Self-pay | Admitting: Internal Medicine

## 2014-06-03 NOTE — Telephone Encounter (Signed)
Mammogram showed possible solid area in the left breast. They recommend additional views and possible ultrasound. Has this been scheduled?

## 2014-06-04 NOTE — Telephone Encounter (Signed)
Left message for pt to return my call.

## 2014-06-04 NOTE — Telephone Encounter (Signed)
Pt left VM, stating we could leave message on her VM, she was going into a meeting. Left message, notifying of message, advised to contact mammogram facility if has not heard from them yet.

## 2014-06-05 ENCOUNTER — Encounter: Payer: Self-pay | Admitting: Internal Medicine

## 2014-06-05 ENCOUNTER — Ambulatory Visit: Payer: Self-pay | Admitting: Internal Medicine

## 2014-06-23 ENCOUNTER — Encounter: Payer: Self-pay | Admitting: Internal Medicine

## 2014-08-28 ENCOUNTER — Ambulatory Visit: Payer: Self-pay | Admitting: Physician Assistant

## 2014-10-04 ENCOUNTER — Encounter: Payer: Self-pay | Admitting: Internal Medicine

## 2014-10-05 ENCOUNTER — Other Ambulatory Visit: Payer: Self-pay | Admitting: *Deleted

## 2014-10-05 MED ORDER — FLUCONAZOLE 150 MG PO TABS: 150.0000 mg | ORAL_TABLET | Freq: Once | ORAL | Status: DC

## 2014-10-23 IMAGING — CR DG CHEST 1V PORT
1 series · 1 of 1 positions shown · non-contrast
Comparison: none

REASON FOR EXAM: line plaement
COMMENTS:

PROCEDURE:     DXR - DXR PORTABLE CHEST SINGLE VIEW  - November 16, 2012  [DATE]
RESULT:     Comparison: None

[ap]
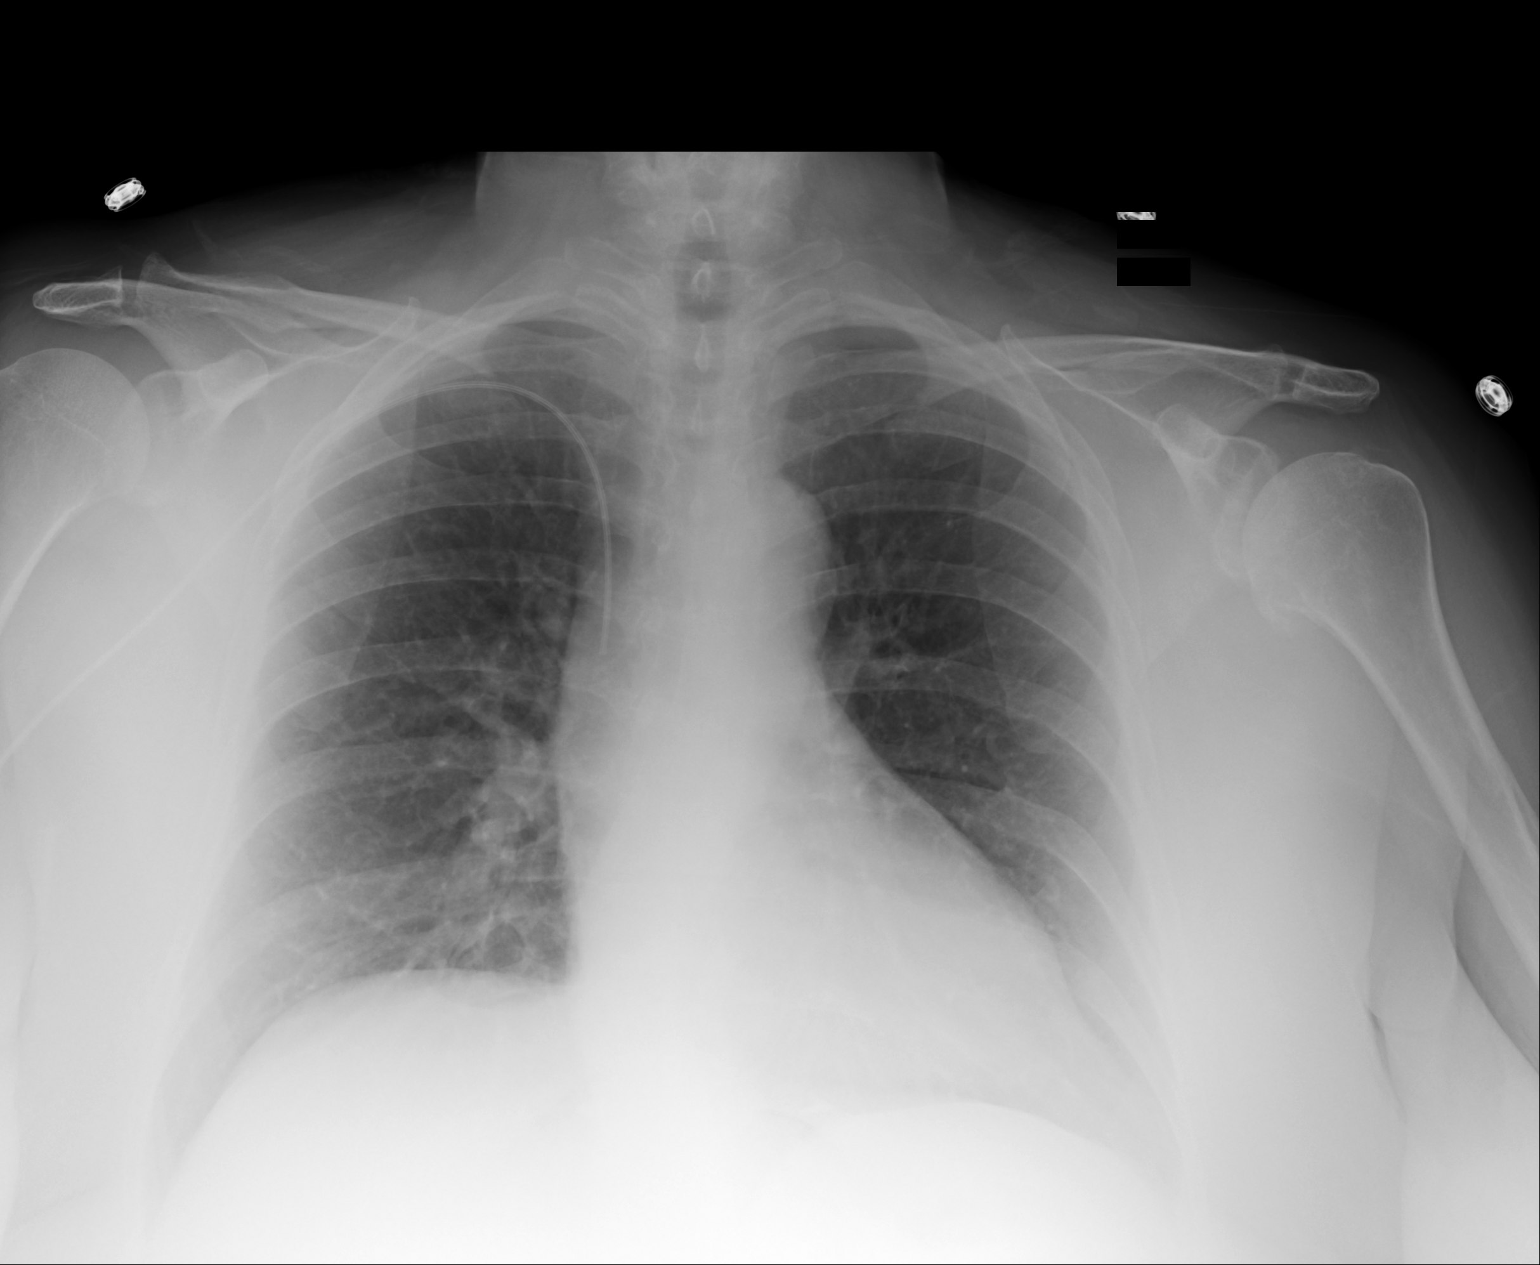

[1 of 1 positions shown; findings below may reference images not displayed]

FINDINGS: Single portable AP chest radiograph is provided. There is a right-sided PICC
line in satisfactory position. There is no focal parenchymal opacity,
pleural effusion, or pneumothorax. Normal cardiomediastinal silhouette. The
osseous structures are unremarkable.
IMPRESSION: No acute disease of the che[REDACTED]

## 2015-01-23 ENCOUNTER — Encounter: Payer: Self-pay | Admitting: Internal Medicine

## 2015-01-25 ENCOUNTER — Other Ambulatory Visit: Payer: Self-pay | Admitting: Internal Medicine

## 2015-01-26 NOTE — Telephone Encounter (Signed)
Last visit 7.7.15 with 3 CPE cancellations.  please advise refill

## 2015-01-29 NOTE — Consult Note (Signed)
PATIENT NAME:  [name], [name] MR#:  [id number] DATE OF BIRTH:  [birth year]  DATE OF CONSULTATION:  11/16/2012  REFERRING PHYSICIAN:  Laurice Record. Holley Bouche., MD CONSULTING PHYSICIAN:  Deborra Medina, MD  REASON FOR CONSULTATION: Tachycardia.   HISTORY OF PRESENT ILLNESS: The patient is a delightful 59 year old white female who underwent a gastric sleeve for treatment of morbid obesity on 09/26/2012 at [facility]. She was admitted to [facility] on February 5 for elective left total hip replacement by Dr. Marry Guan for management of severe degenerative joint disease. There were no postoperative complications. The patient's hemoglobin at admission was 10.1 and on day of dictation was 8.6. The patient has been having resting tachycardia with orthostasis and increased heart rate to the 150s with upright position, which has been limiting her ability to participate in physical therapy. At the time of my exam, she denies any unmet pain needs. She was resting comfortably. She denies any history of chest pain, shortness of breath and bleeding. Her drains were removed by Dr. Marry Guan and only minimal bruising was noted around the op site with no signs of hematoma formation. The patient states that since she has been in the hospital, she has had difficulty keeping up with the protein intake that is advised after gastric banding. She has also been attempting to maintain adequate hydration, but feels that she has not been keeping up with fluids as she was doing at home. Thus far, she has lost 23 pounds since her gastric banding surgery.   PAST MEDICAL HISTORY:   1.  Morbid obesity, status post gastric banding.  2.  Degenerative hip disease.  3.  Osteoarthritis.  4.  Depression.  5.  Hepatitis B, resolved.   CURRENT MEDICATIONS: Wellbutrin SR 150 mg 1 tablet twice daily, Protonix 40 mg daily,  Ultram 50 to 100 mg every 6 hours as needed for pain, Roxicet 2 tsp by mouth every 4 hours as needed for pain, Vicodin 1 to 2  tablets every 4 to 6 hours as needed for pain, Zofran 4 mg 1 tablet every 8 hours as needed for pain.   ALLERGIES: The patient has no known drug allergies.   PAST SURGICAL HISTORY: Notable for a cholecystectomy in 1988, left breast lumpectomy in 1988, 1996 and 2011, hysterectomy in 2000, left knee arthroscopy in 1997 and again in 2002, and gastric banding 09/30/2012.   SOCIAL HISTORY: She is married. She drinks caffeine. She does not use tobacco at all. She drinks alcohol socially. She is college educated and does not exercise regularly due to degenerative arthritis.   FAMILY HISTORY: Notable for father with prostate cancer, mother with asthma and some coagulopathy in her maternal history.   REVIEW OF SYSTEMS:  CONSTITUTIONAL: Negative again for unintentional weight loss, malaise and fevers.  HEENT: Negative for sinus pain, sinus congestion, blurred vision and hearing loss.  PULMONARY: Negative for shortness of breath, orthopnea and cough.  CARDIOVASCULAR: Negative for palpitations, syncope, hypertension and chest pain. GASTROINTESTINAL: Negative for abdominal pain, nausea, vomiting or diarrhea. She moves her bowels regularly.  ENDOCRINE: Negative for polyuria or polydipsia.  PSYCHIATRIC: Negative for anxiety, insomnia, suicidal ideation and tremor.  NEUROLOGICAL: Negative for numbness and tingling, dysarthria, dysphasia and memory loss.   PHYSICAL EXAMINATION:  GENERAL: This is a well nourished middle-aged female in no apparent distress.  VITAL SIGNS: Height is 5 feet 3 inches. This morning, temperature is 98.7, pulse 105 and regular, respirations 20. Blood pressure is 120/76 lying down. Orthostatics were apparently positive,  but those numbers are not available to me at the time of dictation due to system failure.  LUNGS: Clear to auscultation bilaterally in all lung fields with good air movement and no rales.  CARDIOVASCULAR: Regular rhythm with tachycardia noted. There are no murmurs, rubs  or gallops. Chest wall is nontender.  ABDOMEN: Soft and nondistended with normal bowel sounds and no hepatosplenomegaly.  EXTREMITIES: Well perfused and without pitting edema. There is no ecchymosis or signs of hematoma on the left upper leg.  NEUROLOGIC: Grossly nonfocal.   LABORATORY AND DIAGNOSTIC DATA: Laboratory data available at the time of dictation is as follows: CBC on February 8: White count 13.5 hemoglobin, 8.6, platelets 260, MCV 95. Basic metabolic panel: Sodium 269, potassium 4.0, chloride 105, bicarbonate 27, BUN 9, creatinine 0.7, glucose 128. Twelve-lead EKG done today shows sinus tachycardia with poor R wave progression. There is T wave inversion in the lateral leads, which is seen on EKG done in October 2013.   ASSESSMENT AND PLAN:  1.  Tachycardia: The patient's tachycardia is chronic and has multiple etiologies. She is chronically deconditioned due to obesity and osteoarthritis involving multiple joints and does not exercise regularly. Review of office notes from Dr. Gilford Rile note tachycardia on multiple occasions. Additionally, she is mildly volume depleted due to acute blood loss from surgery and lack of IV access, with no IV fluids given since she has been in the hospital. Plan PICC line for IV fluids. I will give her 2 liters over the next 8 hours and recheck orthostatics. Will use metoprolol 25 mg every 12 hours as needed for sustained heart rate over 140, holding for systolic less than 485.  2.  Acute blood loss anemia: The patient's hemoglobin has trended down to 8.6. It was 13.7 prior to her gastric banding. Would recommend checking iron studies given her induced malabsorption state as well as B12 and folate and replace as needed. She does not meet criteria for transfusion at this time.   ESTIMATED TIME OF CARE: 40 minutes.  Thank you for this interesting consult.    ____________________________ Deborra Medina, MD tt:jm D: 11/16/2012 15:51:57 ET T: 11/16/2012 16:28:01  ET JOB#: [id number]  cc: Deborra Medina, MD, <Dictator> Deborra Medina MD ELECTRONICALLY SIGNED 11/18/2012 19:41

## 2015-01-29 NOTE — Consult Note (Signed)
Chief Complaint:  Subjective/Chief Complaint Hemoglobin 8.3 status post 1 PRBC, doing fine, plans to go home today, heart rate still somewhat up   VITAL SIGNS/ANCILLARY NOTES: **Vital Signs.:   10-Feb-14 05:21  Vital Signs Type Q 4hr  Temperature Temperature (F) 98.7  Celsius 37  Temperature Source oral  Pulse Pulse 101  Respirations Respirations 19  Systolic BP Systolic BP 294  Diastolic BP (mmHg) Diastolic BP (mmHg) 83  Mean BP 101  Pulse Ox % Pulse Ox % 98  Pulse Ox Activity Level  At rest  Oxygen Delivery Room Air/ 21 %   Brief Assessment:  Cardiac Regular  no murmur   Respiratory normal resp effort  clear BS   Gastrointestinal Normal   Assessment/Plan:  Assessment/Plan:  Assessment 1.  Sinus Tachycardia: still has some, start metoprolol 25 mg every 12 hours and hold for systolic less than 765.   2.  Acute blood loss anemia: now Hemoglobin 8.3 status post 1 prbc transfusion y'day. It was 13.7 prior to her gastric banding. contoinue iron replacement.  3. hypokalemia: replete and resolved  4. lt THA: management per ortho   Plan i have given script for metoprolol on discharge. medically clear for discharge, recommend recheck iron levels in 3 weeks as an outpt  time spent: 20 mins   Informed Consent for Blood Transfusion I have explained the risks and benefits of blood transfusion, discussed alternatives and answered all questions. The patient consents to transfusion. A signed Informed Consent Form will be obtained prior to transfusion.   Electronic Signatures: Remer Macho (MD)  (Signed 10-Feb-14 11:38)  Authored: Chief Complaint, VITAL SIGNS/ANCILLARY NOTES, Brief Assessment, Assessment/Plan   Last Updated: 10-Feb-14 11:38 by Remer Macho (MD)

## 2015-01-29 NOTE — Consult Note (Signed)
Chief Complaint:  Subjective/Chief Complaint Hemoglobin 7.3 and 1 PRBC ordered by ortho, low iron levels (18), no complaints, heart rate somewhat better controlled   VITAL SIGNS/ANCILLARY NOTES: **Vital Signs.:   09-Feb-14 13:27  Vital Signs Type Pre-Blood  Temperature Temperature (F) 98.3  Celsius 36.8  Temperature Source oral  Pulse Pulse 105  Respirations Respirations 16  Systolic BP Systolic BP 357  Diastolic BP (mmHg) Diastolic BP (mmHg) 80  Mean BP 94  Pulse Ox % Pulse Ox % 98  Oxygen Delivery Room Air/ 21 %   Brief Assessment:  Cardiac Regular  no murmur   Respiratory normal resp effort  clear BS   Gastrointestinal Normal   Assessment/Plan:  Assessment/Plan:  Assessment 1.  Sinus Tachycardia: The patient's tachycardia is chronic and has multiple etiologies. She is chronically deconditioned due to obesity and osteoarthritis involving multiple joints and does not exercise regularly. Review of office notes from Dr. Gilford Rile note tachycardia on multiple occasions. Additionally, she is mildly volume depleted due to acute blood loss from surgery and lack of IV access, with no IV fluids given since she has been in the hospital. status post PICC line and 2 liters fluids given. continue metoprolol 25 mg every 12 hours as needed for sustained heart rate over 140, holding for systolic less than 017.   2.  Acute blood loss anemia: The patient's hemoglobin has now trended down to 7.3. It was 13.7 prior to her gastric banding. 1 PRBC ordered by ortho. also has iron deficiency and will start iron replacement.  3. hypokalemia: replete and recheck, check mg   4. lt THA: management per ortho   Plan time spent: 20 mins   Informed Consent for Blood Transfusion I have explained the risks and benefits of blood transfusion, discussed alternatives and answered all questions. The patient consents to transfusion. A signed Informed Consent Form will be obtained prior to transfusion.   Electronic  Signatures: Remer Macho (MD)  (Signed 09-Feb-14 14:06)  Authored: Chief Complaint, VITAL SIGNS/ANCILLARY NOTES, Brief Assessment, Assessment/Plan   Last Updated: 09-Feb-14 14:06 by Remer Macho (MD)

## 2015-01-29 NOTE — Discharge Summary (Signed)
PATIENT NAME:  [name], Janet [name] MR#:  [id number] DATE OF BIRTH:  29-Jun-1956  DATE OF ADMISSION:  11/13/2012 DATE OF DISCHARGE:  11/18/2012  CONSULTATIONS: Deborra Medina, MD  ADMITTING DIAGNOSIS: Degenerative arthrosis of left hip.   DISCHARGE DIAGNOSIS: Degenerative arthrosis of left hip.  HISTORY OF PRESENT ILLNESS: The patient is [name] 59 year old female who has been followed at [facility] for progression of left hip and groin pain. She reported [name] 1 to 2 year history of groin and trochanteric bursa pain. She had not seen any significant improvement in her condition despite cortisone injections to the trochanteric region. She has stated that she actually saw an increase in her left groin pain. She has appreciated decrease in her range of motion of the left hip. The pain was noted to be aggravated with weight-bearing activities. At the time of surgery, she was using [name] cane for ambulation. She had not seen any significant improvement in her condition despite the use of meloxicam.   The patient had previously underwent bariatric surgery for gastric sling procedure, approximately 09/30/2012. Since that time she has lost approximately 23 pounds. She was concerned about having the surgery only from [name] dietary standpoint. She is on restricted protein diet. X-rays taken in the [facility] orthopedic department showed narrowing of the cartilage space with associated cystic changes noted. Good mineralization of the bone was noted. Some osteophyte formation was noted. After discussion of the risks and benefits of surgical intervention, the patient expressed her understanding of the risks and benefits and agreed with  plans for surgical intervention.   PROCEDURE: Left total hip arthroplasty.   ANESTHESIA: Spinal.   IMPLANTS UTILIZED: DePuy 13.5 mm small stature AML femoral component, 52 mm outer diameter Pinnacle 100 acetabular component, +4 mm 10 degree Pinnacle Marathon polyethylene liner, and [name] 36 mm  M-SPEC femoral head with [name] +5 mm neck length.   HOSPITAL COURSE: The patient tolerated the procedure very well. She had no complications. She was then taken to PAC-U where she was stabilized and then transferred to the orthopedic floor. The patient began receiving anticoagulation therapy of Lovenox 40 mg q. 12 hours. She was fitted with TED stockings bilaterally. These were allowed to be removed 1 hour per 8 hour shift. She was also fitted with the AV-I compression foot pumps bilaterally set at 80 mmHg. The calves have been nontender. There has been no evidence of any DVTs. Negative Homans sign. Heels were elevated off the bed using rolled towels.   Vital signs for the most part have been stable. However, these really did not change [name] whole lot during the course but slightly tachycardic. I will address this [name] little later. She has been afebrile. Hemodynamically she did drop to 6.5 and subsequently 1 unit of packed RBCs was given. This improved to 8.3 and the patient was feeling much better upon receiving this. This was given secondary to the fact that she was lightheaded, dizzy and extremely weak upon getting up.   The patient's IVs were discontinued on day 2 along with the Hemovac and catheter.   Physical therapy was initiated on day 1 for ADL and assistive devices, but due to her deconditioning and tachycardia and weakness this was extremely slow initially. However, upon being discharged, she was ambulating greater than 200 feet. She was able go up and down 4 sets of steps. She was independent with bed to chair transfers as well as bed to bathroom  privileges.   As stated earlier, the patient was  noted to have increased tachycardia with getting from [name] sitting to [name] standing position. Subsequently, Dr. Derrel Nip was contacted for [name] medicine consult. After doing some research with Dr. Ronette Deter, who is her primary care, it appears that this is [name] chronic condition that she has had for quite some time.  Subsequently, no adjustment was made to this other than we felt she was volume depleted following surgery and the fact that she was not taking [name] lot in orally. She was given 2 liters of fluid and this significantly improved her condition. However, this improved even more so after the 1 unit of packed RBCs. She has had no chest pain or any shortness of breath. Overall the patient has done very well once her fluid level was replenished.   he patient is being discharged to home in improved stable condition.   She will continue with Lovenox 40 mg q. day for 14 days and then discontinue and begin taking one 81 mg enteric-coated aspirin. She is to continue with the TED stockings. These are to be worn during the day but may be removed at night. She is to continue using [name] rolling walker until cleared by physical therapy to go to [name] quad cane. She will receive home health PT. She was instructed on wound care. Staples are to be removed at 2 weeks postoperative. Dressing changed as needed. She is to call the clinic if she has any temperatures of 101.5 or greater or excessive bleeding. She is placed on her high protein diet. She will resume her regular medication that she was on prior to admission. She was given [name] prescription for oxycodone 5 to 10 mg every 4 to 6 hours p.r.n. for pain, tramadol 50 mg to 100 mg every 4 to 6 hours p.r.n. for pain and Lovenox 40 mg sub-Q q. day for 14 days. She is then to begin taking 1 enteric-coated aspirin.   PAST MEDICAL HISTORY: 1. Arthritis.  2. Breast lumps.  3. Depression. 4. Hepatitis B, resolved.  PAST SURGICAL HISTORY:  1. Cholecystectomy, 09/1987. 2. Left breast lumpectomy; 05/1987; 07/28/1995; 07/2010. 3. Hysterectomy. 4. Left knee arthroscopy x 2.  5. Gastric bypass, 09/30/2012.  ____________________________ Vance Peper, PA jrw:sb D: 11/18/2012 11:03:08 ET T: 11/18/2012 12:58:40 ET JOB#: [id number]  cc: Vance Peper, PA, <Dictator> Kailyn Vanderslice PA ELECTRONICALLY SIGNED  11/24/2012 17:36

## 2015-01-29 NOTE — Op Note (Signed)
PATIENT NAME:  [name], [name] MR#:  [id number] DATE OF BIRTH:  08/17/1956  DATE OF PROCEDURE:  11/13/2012  PREOPERATIVE DIAGNOSIS:  Degenerative arthrosis of the left hip.   POSTOPERATIVE DIAGNOSIS:  Degenerative arthrosis of the left hip.   PROCEDURE PERFORMED:  Left total hip arthroplasty.   SURGEON:  Dr. Skip Estimable   ASSISTANT:  Vance Peper, PA-C (required to maintain retraction throughout the procedure).   ANESTHESIA:  Spinal.   ESTIMATED BLOOD LOSS:  500 mL.   FLUIDS REPLACED:  2400 mL of crystalloid.   DRAINS:  Two medium drains to Hemovac reservoir.   IMPLANTS UTILIZED:  DePuy 13.5 mm small stature AML femoral component, 52 mm outer diameter Pinnacle 100 acetabular component, +4 mm 10 degree Pinnacle Marathon polyethylene liner, and a 36 mm M-Spec femoral head with a +5 mm neck length.   INDICATIONS FOR SURGERY:  The patient is a 59 year old female who has been seen for complaints of progressive left hip and groin pain.  X-rays demonstrated severe degenerative changes.  The patient is morbidly obese, but has undergone gastric sling procedure and has actually lost approximately 25 pounds.  Risks and benefits of surgical intervention were discussed with the patient.  The patient expressed her understanding of the risks and benefits and agreed with plans for surgical intervention.   PROCEDURE IN DETAIL:  The patient was brought to the operating room, and after adequate spinal anesthesia was achieved, the patient was placed in a right lateral decubitus position.  Axillary roll was placed and all bony prominences were well padded.  The patient's left hip and leg were cleaned and prepped with alcohol and DuraPrep and draped in the usual sterile fashion.  A "timeout" was performed as per usual protocol.  A lateral curvilinear incision was made gently curving towards the posterior superior iliac spine.  IT band was incised along with the skin incision and the fibers of the gluteus maximus were  split in line.  Piriformis tendon was identified, skeletonized, and incised at its insertion at the proximal femur and reflected posteriorly.  In a similar fashion, the short external rotators were incised and reflected posteriorly.  A T-type posterior capsulotomy was performed.  Limb lengths were measured distally before dislocation of the femoral head.  The femoral head was dislocated posteriorly.  Inspection demonstrated severe degenerative changes with full thickness loss of articular cartilage as well as cystic changes.  A femoral neck cut was performed using an oscillating saw.  The anterior capsule was elevated off of the femoral neck.  Inspection of the acetabulum demonstrated significant degenerative changes with some cystic changes.  Remnant of the labrum was excised.  The acetabulum was reamed in a sequential fashion up to a 51 mm diameter.  Excellent punctate bleeding bone was encountered.  A 52 mm outer diameter Pinnacle 100 acetabular component was positioned and impacted into place.  Excellent scratch fit was achieved.  A +4 mm neutral polyethylene trial was inserted and attention was directed to the proximal femur.  Pilot hole for reaming of the proximal femoral canal was performed using a high-speed burr.  Proximal femoral canal was reamed in a sequential fashion up to a 13 mm diameter.  This allowed for approximately 5-1/2 to 6 cm of scratch fit.  The proximal femur was then prepared using a 13.5 mm aggressive side-biting reamer.  Serial broaches were inserted up to a 13.5 mm small stature broach.  The calcar region was planed accordingly and trial reduction was performed using first  a +1.5 and subsequently a +5 mm neck length.  Good equalization of limb lengths was appreciated.  The trial hip ball was dislocated again and a +4 mm polyethylene trial with a 10 degree face changing build-up was positioned with the high side at approximately the 4 o'clock position.  Trial reduction was again performed  and improved posterior stability was appreciated.  Trial components were removed.  The acetabular shell was irrigated with copious amounts of normal saline with antibiotic solution and suctioned dry. A +4 mm 10 degree Pinnacle Marathon polyethylene liner was positioned with the high side at the 4 o'clock position and then impacted in place.  Next, a 13.5 mm small stature femoral component was positioned, impacted into place.  Excellent scratch fit was achieved.  Trial reduction was again performed with a 36 mm trial hip ball with a +5 mm neck length.  Again, excellent stability both anteriorly and posteriorly was appreciated as well as good equalization of limb lengths.  Trial hip ball was removed.  The Morse taper was cleaned and dried.  A 36 mm M-Spec femoral head with a +5 mm neck length was placed on the trunnion and impacted into place.  The hip was reduced and placed through range of motion.  Excellent stability was appreciated and good equalization of limb lengths again noted.   The wound was irrigated with copious amounts of normal saline with antibiotic solution using pulsatile lavage and then suctioned dry.  The posterior capsulotomy was repaired using #5 Ethibond.  The piriformis tendon was reapproximated undersurface of the gluteus medius tendon and using #5 Ethibond.  Two medium drains were placed in the wound bed and brought through a separate stab incision to be attached to a Hemovac reservoir.  The IT band was repaired using interrupted sutures of #1 Vicryl.  The subcutaneous tissue was approximated in layers using first #0 Vicryl followed by #2-0 Vicryl.  Skin was closed with skin staples.  Sterile dressing was applied.   The patient tolerated the procedure well.  She was transported to the recovery room in stable condition.    ____________________________ Laurice Record. Holley Bouche., MD jph:ea D: 11/13/2012 20:56:36 ET T: 11/14/2012 07:08:47 ET JOB#: [id number]  cc: Laurice Record. Holley Bouche., MD,  <Dictator> Laurice Record Holley Bouche MD ELECTRONICALLY SIGNED 11/15/2012 17:45

## 2015-02-01 ENCOUNTER — Encounter: Payer: Self-pay | Admitting: Internal Medicine

## 2015-03-06 ENCOUNTER — Ambulatory Visit
Admission: EM | Admit: 2015-03-06 | Discharge: 2015-03-06 | Disposition: A | Discharge status: Home / Self Care | Payer: BLUE CROSS/BLUE SHIELD | Attending: Family Medicine | Admitting: Family Medicine

## 2015-03-06 ENCOUNTER — Encounter: Payer: Self-pay | Admitting: Emergency Medicine

## 2015-03-06 DIAGNOSIS — T63891A Toxic effect of contact with other venomous animals, accidental (unintentional), initial encounter: Secondary | ICD-10-CM

## 2015-03-06 DIAGNOSIS — T63481A Toxic effect of venom of other arthropod, accidental (unintentional), initial encounter: Secondary | ICD-10-CM

## 2015-03-06 MED ORDER — PREDNISONE 10 MG PO TABS: 20.0000 mg | ORAL_TABLET | Freq: Every day | ORAL | Status: DC

## 2015-03-06 MED ORDER — FAMOTIDINE 20 MG PO TABS
20.0000 mg | ORAL_TABLET | Freq: Once | ORAL | Status: AC
  Administered 2015-03-06: 20 mg via ORAL

## 2015-03-06 MED ORDER — KETOROLAC TROMETHAMINE 60 MG/2ML IM SOLN
60.0000 mg | Freq: Once | INTRAMUSCULAR | Status: AC
Start: 2015-03-06 — End: 2015-03-06
  Administered 2015-03-06: 60 mg via INTRAMUSCULAR

## 2015-03-06 MED ORDER — RANITIDINE HCL 150 MG PO CAPS: ORAL_CAPSULE | ORAL | Status: DC

## 2015-03-06 MED ORDER — PREDNISONE 20 MG PO TABS: 20.0000 mg | ORAL_TABLET | Freq: Every day | ORAL | Status: DC

## 2015-03-06 MED ORDER — PREDNISONE 20 MG PO TABS
20.0000 mg | ORAL_TABLET | Freq: Every day | ORAL | Status: AC
  Administered 2015-03-06: 20 mg via ORAL

## 2015-03-06 NOTE — ED Notes (Addendum)
Patient presents here with c/o redness/swelling to the rt thumb and hand from the bee sting, denies any pain, happened at 2 pm yesterday, took benadryl 25 mg otc every 4 hrs with no relief, swelling getting worse

## 2015-03-06 NOTE — ED Provider Notes (Signed)
CSN: [id number]     Arrival date & time 03/06/15  1101 History   First MD Initiated Contact with Patient 03/06/15 1234     Chief Complaint  Patient presents with  . 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 Has had stings before . This was 5 stings on right hand- itching - took Benadryl to sleep. No SOB, no change in hand function- mild swelling but erythema and itch bothering her today. Episode about 20 hours ago.   Past Medical History  Diagnosis Date  . Morbid obesity   . Anxiety   . GERD (gastroesophageal reflux disease)   . Arthritis     left hip  . Hepatitis 1972    hep B  . PONV (postoperative nausea and vomiting)    Past Surgical History  Procedure Laterality Date  . Arthroscopic repair acl      bilateral knees  . Abdominal hysterectomy  04/1999  . Breast lumpectomy  06/1979; 03/1995    left  . Cholecystectomy  09/1987  . Laparoscopic gastric sleeve resection  09/30/2012    Procedure: LAPAROSCOPIC GASTRIC SLEEVE RESECTION;  Surgeon: Madilyn Hook, DO;  Location: [facility];  Service: General;  Laterality: N/A;  . Upper gi endoscopy  09/30/2012    Procedure: UPPER GI ENDOSCOPY;  Surgeon: Madilyn Hook, DO;  Location: [facility];  Service: General;;  . Total hip arthroplasty  11/13/2012    left   Family History  Problem Relation Age of Onset  . Cancer Maternal Aunt     ovarian cancer  . Hyperlipidemia Mother   . Obesity Mother   . Cancer Father    History  Substance Use Topics  . Smoking status: Former Smoker -- 0.25 packs/day for 5 years    Types: Cigarettes    Quit date: 07/25/2001  . Smokeless tobacco: Never Used  . Alcohol Use: No     Comment: rarely   OB History    No data available     Review of Systems  Constitutional -afebrile Eyes-denies visual changes ENT- normal voice,denies sore throat CV-denies chest  pain Resp-denies SOB GI- negative for nausea,vomiting, diarrhea GU- negative for dysuria MSK- negative for back pain, ambulatory Skin- Right hand dorsum and thumb with bee sting marks, erythema and mild swelling Neuro- negative headache,focal weakness or numbness    Allergies  Review of patient's allergies indicates no known allergies.  Home Medications   Prior to Admission medications   Medication Sig Start Date End Date Taking? Authorizing Provider  Aspirin (ASPIR-81 PO) Take 81 mg by mouth daily.   Yes Historical Provider, MD  BIOTIN FORTE PO Take 1 capsule by mouth 2 (two) times daily.    Yes Historical Provider, MD  buPROPion (WELLBUTRIN SR) 150 MG 12 hr tablet TAKE ONE TABLET BY MOUTH TWICE DAILY 01/25/15  Yes Jackolyn Confer, MD  Calcium Carbonate-Vitamin D (CALCIUM 600/VITAMIN D) 600-400 MG-UNIT per chew tablet Chew 1 tablet by mouth 3 (three) times daily with meals.   Yes Historical Provider, MD  Cyanocobalamin (B-12 PO) Take by mouth.   Yes Historical Provider, MD  Multiple Vitamin (MULTIVITAMIN WITH MINERALS) TABS Take 1 tablet by mouth 2 (two) times daily.    Yes Historical Provider, MD  Omega-3 Fatty Acids (FISH OIL) 1200 MG CAPS Take 1 capsule by mouth 2 (two) times daily.   Yes Historical Provider, MD  pantoprazole (PROTONIX) 40 MG tablet TAKE ONE TABLET BY  MOUTH ONCE DAILY 01/26/15  Yes Jackolyn Confer, MD  fluconazole (DIFLUCAN) 150 MG tablet Take 1 tablet (150 mg total) by mouth once. 10/05/14   Jackolyn Confer, MD  predniSONE (DELTASONE) 10 MG tablet Take 2 tablets (20 mg total) by mouth daily. 03/06/15   Jan Fireman, PA-C  ranitidine (ZANTAC) 150 MG capsule 150 mg twice daily for 5 days 03/06/15   Jan Fireman, PA-C  sulfamethoxazole-trimethoprim (BACTRIM DS) 800-160 MG per tablet Take 1 tablet by mouth 2 (two) times daily. 04/14/14   Jackolyn Confer, MD   BP 143/99 mmHg  Pulse 90  Temp(Src) 97.1 F (36.2 C) (Oral)  Resp 18  Ht 5\' 4"  (1.626 m)  Wt 210 lb  (95.255 kg)  BMI 36.03 kg/m2  SpO2 98% Physical Exam  Constitutional -alert and oriented,well appearing and in mild acute distress Head-atraumatic Eyes- EOMI ,conjugate gaze Nose- no congestion or rhinorrhea Mouth/throat- mucous membranes moist ,oropharynx non-erythematous, no sweling Neck- supple without glandular enlargement CV- regular rate,  Resp-no distress,  GI- not distended GU- not examined MSK- no lower extremity tenderness nor edema,no joint effusion, ambulatory Neuro- normal speech and language, Skin-warm,dry ,intact; no rash noted - Right hand dorsum and thumb with bee sting marks, erythema and mild swelling Psych-mood and affect grossly normal; speech and behavior grossly normal  ED Course  Procedures (including critical care time) Labs Review Labs Reviewed - No data to display  Imaging Review No results found.  Medications  ketorolac (TORADOL) injection 60 mg (60 mg Intramuscular Given 03/06/15 1310)  predniSONE (DELTASONE) tablet 20 mg (20 mg Oral Given 03/06/15 1310)  famotidine (PEPCID) tablet 20 mg (20 mg Oral Given 03/06/15 1250)   Toradol appeared to reduced itch and pain of hand during visit  MDM   1. Insect stings, accidental or unintentional, initial encounter    Plan: 1.  diagnosis reviewed with patient 2. Rx as per orders; Zantac 150 mg BID, Zyrtec 10 mg BID, Prednisone 20 mg QD x 3 additional days. risks, benefits, potential side effects reviewed with patient 3. Recommend supportive treatment with OTC NSAIDs or Tylenol as desired 4. F/u prn if symptoms worsen or don't improve 5. Questions fielded, recommendations and expectations reviewed.Patient expresses understanding and will f/u with issues    Jan Fireman, PA-C 03/06/15 1929

## 2015-03-06 NOTE — Discharge Instructions (Signed)
Rx Prednisone 20 mg daily for 3 days  Rx Zantac (ranitidine) 150 mg  twice daily for 3 -5 days Zyrtec (ceterizine) 10 mg  One tab twice daily for 3-5 days   Bee, Wasp, or [employer] Your caregiver has diagnosed you as having an insect sting. An insect sting appears as a red lump in the skin that sometimes has a tiny hole in the center, or it may have a stinger in the center of the wound. The most common stings are from wasps, hornets and bees. Individuals have different reactions to insect stings.  A normal reaction may cause pain, swelling, and redness around the sting site.  A localized allergic reaction may cause swelling and redness that extends beyond the sting site.  A large local reaction may continue to develop over the next 12 to 36 hours.  On occasion, the reactions can be severe (anaphylactic reaction). An anaphylactic reaction may cause wheezing; difficulty breathing; chest pain; fainting; raised, itchy, red patches on the skin; a sick feeling to your stomach (nausea); vomiting; cramping; or diarrhea. If you have had an anaphylactic reaction to an insect sting in the past, you are more likely to have one again. HOME CARE INSTRUCTIONS   With bee stings, a small sac of poison is left in the wound. Brushing across this with something such as a credit card, or anything similar, will help remove this and decrease the amount of the reaction. This same procedure will not help a wasp sting as they do not leave behind a stinger and poison sac.  Apply a cold compress for 10 to 20 minutes every hour for 1 to 2 days, depending on severity, to reduce swelling and itching.  To lessen pain, a paste made of water and baking soda may be rubbed on the bite or sting and left on for 5 minutes.  To relieve itching and swelling, you may use take medication or apply medicated creams or lotions as directed.  Only take over-the-counter or prescription medicines for pain, discomfort, or fever as  directed by your caregiver.  Wash the sting site daily with soap and water. Apply antibiotic ointment on the sting site as directed.  If you suffered a severe reaction:  If you did not require hospitalization, an adult will need to stay with you for 24 hours in case the symptoms return.  You may need to wear a medical bracelet or necklace stating the allergy.  You and your family need to learn when and how to use an anaphylaxis kit or epinephrine injection.  If you have had a severe reaction before, always carry your anaphylaxis kit with you. SEEK MEDICAL CARE IF:   None of the above helps within 2 to 3 days.  The area becomes red, warm, tender, and swollen beyond the area of the bite or sting.  You have an oral temperature above 102 F (38.9 C). SEEK IMMEDIATE MEDICAL CARE IF:  You have symptoms of an allergic reaction which are:  Wheezing.  Difficulty breathing.  Chest pain.  Lightheadedness or fainting.  Itchy, raised, red patches on the skin.  Nausea, vomiting, cramping or diarrhea. ANY OF THESE SYMPTOMS MAY REPRESENT A SERIOUS PROBLEM THAT IS AN EMERGENCY. Do not wait to see if the symptoms will go away. Get medical help right away. Call your local emergency services (911 in [place] DO NOT drive yourself to the hospital. MAKE SURE YOU:   Understand these instructions.  Will watch your condition.  Will get help  right away if you are not doing well or get worse. Document Released: 09/25/2005 Document Revised: 12/18/2011 Document Reviewed: 03/12/2010 [facility] Patient Information 2015 [place], [place]. This information is not intended to replace advice given to you by your health care provider. Make sure you discuss any questions you have with your health care provider.

## 2015-04-18 ENCOUNTER — Encounter: Payer: Self-pay | Admitting: Internal Medicine

## 2015-04-21 ENCOUNTER — Ambulatory Visit
Admission: EM | Admit: 2015-04-21 | Discharge: 2015-04-21 | Disposition: A | Discharge status: Home / Self Care | Payer: BLUE CROSS/BLUE SHIELD | Attending: Family Medicine | Admitting: Family Medicine

## 2015-04-21 ENCOUNTER — Ambulatory Visit: Payer: BLUE CROSS/BLUE SHIELD

## 2015-04-21 DIAGNOSIS — W010XXA Fall on same level from slipping, tripping and stumbling without subsequent striking against object, initial encounter: Secondary | ICD-10-CM | POA: Diagnosis not present

## 2015-04-21 DIAGNOSIS — Z96649 Presence of unspecified artificial hip joint: Secondary | ICD-10-CM | POA: Diagnosis not present

## 2015-04-21 DIAGNOSIS — M1711 Unilateral primary osteoarthritis, right knee: Secondary | ICD-10-CM | POA: Insufficient documentation

## 2015-04-21 DIAGNOSIS — E669 Obesity, unspecified: Secondary | ICD-10-CM | POA: Diagnosis not present

## 2015-04-21 DIAGNOSIS — T148 Other injury of unspecified body region: Secondary | ICD-10-CM | POA: Diagnosis not present

## 2015-04-21 DIAGNOSIS — S838X1A Sprain of other specified parts of right knee, initial encounter: Secondary | ICD-10-CM | POA: Diagnosis not present

## 2015-04-21 DIAGNOSIS — T148XXA Other injury of unspecified body region, initial encounter: Secondary | ICD-10-CM

## 2015-04-21 DIAGNOSIS — F419 Anxiety disorder, unspecified: Secondary | ICD-10-CM | POA: Insufficient documentation

## 2015-04-21 DIAGNOSIS — S8391XA Sprain of unspecified site of right knee, initial encounter: Secondary | ICD-10-CM

## 2015-04-21 DIAGNOSIS — Z87891 Personal history of nicotine dependence: Secondary | ICD-10-CM | POA: Diagnosis not present

## 2015-04-21 DIAGNOSIS — M25561 Pain in right knee: Secondary | ICD-10-CM | POA: Diagnosis present

## 2015-04-21 DIAGNOSIS — K219 Gastro-esophageal reflux disease without esophagitis: Secondary | ICD-10-CM | POA: Diagnosis not present

## 2015-04-21 MED ORDER — ACETAMINOPHEN 500 MG PO TABS: 1000.0000 mg | ORAL_TABLET | Freq: Four times a day (QID) | ORAL | Status: DC | PRN

## 2015-04-21 NOTE — ED Provider Notes (Signed)
CSN: [id number]     Arrival date & time 04/21/15  1119 History   First MD Initiated Contact with Patient 04/21/15 1217     Chief Complaint  Patient presents with  . Fall  . Knee Pain  . 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  Hit right knee bruise has been worsening and spreading down shin.  Swelling unchanged.  Tylenol, rest, ice, elevation helps with pain.  Has been altering gait to decrease pressure right knee and left hip hurting hx left hip replacement.  Sleeping ok  Patient is a 59 y.o. female presenting with fall, knee pain, and leg pain. The history is provided by the patient.  Fall This is a new problem. The current episode started more than 1 week ago. The problem occurs constantly. The problem has not changed since onset.Pertinent negatives include no chest pain, no abdominal pain, no headaches and no shortness of breath. The symptoms are aggravated by walking, bending, twisting and standing. The symptoms are relieved by rest, position, ice and acetaminophen. She has tried a cold compress, rest, acetaminophen, food and water for the symptoms. The treatment provided moderate relief.  Knee Pain Location:  Knee Time since incident:  11 days Injury: yes   Mechanism of injury: fall   Fall:    Fall occurred:  Standing   Height of fall:  Tile floor wet surface   Impact surface:  Hard floor   Point of impact:  Knees   Entrapped after fall: no   Knee location:  R knee Pain details:    Quality:  Aching   Radiates to:  Does not radiate   Severity:  Moderate   Onset quality:  Sudden   Duration:  11 days   Timing:  Constant   Progression:  Unchanged Chronicity:  New Dislocation: no   Foreign body present:  No foreign bodies Prior injury to area:  No Relieved by:  Acetaminophen, ice, rest,  elevation and immobilization Worsened by:  Bearing weight Associated symptoms: stiffness and swelling   Associated symptoms: no back pain, no decreased ROM, no fatigue, no fever, no itching, no muscle weakness, no neck pain, no numbness and no tingling   Risk factors: obesity   Risk factors: no concern for non-accidental trauma, no frequent fractures, no known bone disorder and no recent illness   Leg Pain Associated symptoms: stiffness and swelling   Associated symptoms: no back pain, no decreased ROM, no fatigue, no fever, no itching, no muscle weakness, no neck pain, no numbness and no tingling     Past Medical History  Diagnosis Date  . Morbid obesity   . Anxiety   . GERD (gastroesophageal reflux disease)   . Arthritis     left hip  . Hepatitis 1972    hep B  . PONV (postoperative nausea and vomiting)    Past Surgical History  Procedure Laterality Date  . Arthroscopic repair acl      bilateral knees  . Abdominal hysterectomy  04/1999  . Breast lumpectomy  06/1979; 03/1995    left  . Cholecystectomy  09/1987  . Laparoscopic gastric sleeve resection  09/30/2012    Procedure: LAPAROSCOPIC GASTRIC SLEEVE RESECTION;  Surgeon: Madilyn Hook, DO;  Location: [facility];  Service: General;  Laterality: N/A;  . Upper gi endoscopy  09/30/2012    Procedure: UPPER GI ENDOSCOPY;  Surgeon: Madilyn Hook,  DO;  Location: [facility];  Service: General;;  . Total hip arthroplasty  11/13/2012    left   Family History  Problem Relation Age of Onset  . Cancer Maternal Aunt     ovarian cancer  . Hyperlipidemia Mother   . Obesity Mother   . Cancer Father    History  Substance Use Topics  . Smoking status: Former Smoker -- 0.25 packs/day for 5 years    Types: Cigarettes    Quit date: 07/25/2001  . Smokeless tobacco: Never Used  . Alcohol Use: No     Comment: rarely   OB History    No data available     Review of Systems  Constitutional: Negative for fever, chills, diaphoresis, activity change,  appetite change and fatigue.  HENT: Negative for facial swelling, trouble swallowing and voice change.   Eyes: Negative for photophobia, pain, discharge, redness, itching and visual disturbance.  Respiratory: Negative for cough, shortness of breath, wheezing and stridor.   Cardiovascular: Positive for leg swelling. Negative for chest pain and palpitations.  Gastrointestinal: Negative for nausea, vomiting, abdominal pain, diarrhea, constipation and blood in stool.  Endocrine: Negative for cold intolerance and heat intolerance.  Genitourinary: Negative for dysuria and difficulty urinating.  Musculoskeletal: Positive for myalgias, joint swelling, arthralgias, gait problem and stiffness. Negative for back pain, neck pain and neck stiffness.  Skin: Positive for color change. Negative for itching, pallor, rash and wound.  Allergic/Immunologic: Negative for environmental allergies and food allergies.  Neurological: Negative for dizziness, tremors, seizures, syncope, facial asymmetry, speech difficulty, weakness, light-headedness, numbness and headaches.  Hematological: Negative for adenopathy. Bruises/bleeds easily.  Psychiatric/Behavioral: Negative for behavioral problems, confusion, sleep disturbance and agitation.    Allergies  Review of patient's allergies indicates no known allergies.  Home Medications   Prior to Admission medications   Medication Sig Start Date End Date Taking? Authorizing Provider  Aspirin (ASPIR-81 PO) Take 81 mg by mouth daily.   Yes Historical Provider, MD  BIOTIN FORTE PO Take 1 capsule by mouth 2 (two) times daily.    Yes Historical Provider, MD  buPROPion (WELLBUTRIN SR) 150 MG 12 hr tablet TAKE ONE TABLET BY MOUTH TWICE DAILY 01/25/15  Yes Jackolyn Confer, MD  Calcium Carbonate-Vitamin D (CALCIUM 600/VITAMIN D) 600-400 MG-UNIT per chew tablet Chew 1 tablet by mouth 3 (three) times daily with meals.   Yes Historical Provider, MD  Cyanocobalamin (B-12 PO) Take by  mouth.   Yes Historical Provider, MD  Multiple Vitamin (MULTIVITAMIN WITH MINERALS) TABS Take 1 tablet by mouth 2 (two) times daily.    Yes Historical Provider, MD  Omega-3 Fatty Acids (FISH OIL) 1200 MG CAPS Take 1 capsule by mouth 2 (two) times daily.   Yes Historical Provider, MD  acetaminophen (TYLENOL) 500 MG tablet Take 2 tablets (1,000 mg total) by mouth every 6 (six) hours as needed for mild pain or moderate pain. 04/21/15   Olen Cordial, NP  fluconazole (DIFLUCAN) 150 MG tablet Take 1 tablet (150 mg total) by mouth once. 10/05/14   Jackolyn Confer, MD  pantoprazole (PROTONIX) 40 MG tablet TAKE ONE TABLET BY MOUTH ONCE DAILY 01/26/15   Jackolyn Confer, MD  predniSONE (DELTASONE) 10 MG tablet Take 2 tablets (20 mg total) by mouth daily. 03/06/15   Jan Fireman, PA-C  ranitidine (ZANTAC) 150 MG capsule 150 mg twice daily for 5 days 03/06/15   Jan Fireman, PA-C  sulfamethoxazole-trimethoprim (BACTRIM DS) 800-160 MG per tablet Take 1 tablet  by mouth 2 (two) times daily. 04/14/14   Jackolyn Confer, MD   BP 129/98 mmHg  Pulse 68  Temp(Src) 98.1 F (36.7 C) (Tympanic)  Resp 16  Ht 5\' 4"  (1.626 m)  Wt 220 lb (99.791 kg)  BMI 37.74 kg/m2  SpO2 100% Physical Exam  Constitutional: She is oriented to person, place, and time. Vital signs are normal. She appears well-developed and well-nourished. No distress.  HENT:  Head: Normocephalic and atraumatic.  Right Ear: External ear normal.  Left Ear: External ear normal.  Nose: Nose normal.  Mouth/Throat: Oropharynx is clear and moist. No oropharyngeal exudate.  Eyes: Conjunctivae, EOM and lids are normal. Pupils are equal, round, and reactive to light. Right eye exhibits no discharge. Left eye exhibits no discharge. No scleral icterus.  Neck: Trachea normal and normal range of motion. Neck supple. No tracheal deviation present.  Cardiovascular: Normal rate, regular rhythm, normal heart sounds and intact distal pulses.  Exam reveals no gallop  and no friction rub.   No murmur heard. Pulmonary/Chest: Effort normal and breath sounds normal. No stridor. No respiratory distress. She has no wheezes.  Abdominal: Soft. She exhibits no distension.  Musculoskeletal: She exhibits edema and tenderness.       Right hip: Normal.       Left hip: Normal.       Right knee: She exhibits decreased range of motion, swelling, effusion and ecchymosis. She exhibits no deformity, no laceration, no erythema, normal alignment, no LCL laxity, normal patellar mobility, no bony tenderness, normal meniscus and no MCL laxity. Tenderness found. Medial joint line, lateral joint line and patellar tendon tenderness noted. No MCL and no LCL tenderness noted.       Left knee: Normal.       Right ankle: Normal.       Left ankle: Normal.       Cervical back: Normal.       Thoracic back: Normal.       Lumbar back: Normal.       Legs: Right knee circumference 22 inches; ankle 12 inches Left knee circumference 20.5 inches; ankle 10.5 inches; ecchymosis left anterior shin 12x7 inches; slight tenderness; mcmurray's negative bilaterally; slight laxity with anterior drawer compared to left; no grinding/crepitus bilateral knees and hips  Lymphadenopathy:    She has no cervical adenopathy.  Neurological: She is alert and oriented to person, place, and time. She exhibits normal muscle tone. Coordination normal.  Skin: Skin is warm, dry and intact. No rash noted. She is not diaphoretic. No erythema. No pallor.  Psychiatric: She has a normal mood and affect. Her speech is normal and behavior is normal. Judgment and thought content normal. Cognition and memory are normal.  Nursing note and vitals reviewed.   ED Course  Procedures (including critical care time) Labs Review Labs Reviewed - No data to display  Imaging Review Dg Knee Complete 4 Views Right  04/21/2015   CLINICAL DATA:  Slipped and fell on right knee 10 days ago with worsening pain and swelling  EXAM: RIGHT KNEE  - COMPLETE 4+ VIEW  COMPARISON:  None.  FINDINGS: There is primarily bicompartmental degenerative joint disease of the right knee involving the medial and patellofemoral compartments with loss of joint space and spurring. No acute fracture is seen. Alignment is normal. No joint effusion is noted. There is some soft tissue swelling anteriorly and inferiorly over the knee.  IMPRESSION: 1. Bicompartmental degenerative joint disease. No acute abnormality. No joint effusion. 2. Soft  tissue swelling over the anterior inferior knee.   Electronically Signed   By: Ivar Drape M.D.   On: 04/21/2015 13:33   1450 patient contacted via telephone with results of knee xray see above.  Continue plan of care as previously discussed.  Discussed symptoms should improve over the next 2 weeks if worsening follow up with PCM/consider orthopedics referral and weight loss as DJD right knee noted on xray.  Discussed with patient how she can request copy of xray of images on disk and radiology report.  Patient verbalized understanding of information/instructions, agreed with plan of care and had no further questions at this time.  MDM   1. Knee sprain, right, initial encounter   2. Contusion   3. Primary osteoarthritis of right knee   4. Obesity    Patient was instructed to rest, ice and elevate right leg.  May take tylenol 1000mg  po QID prn pain.  Consider using crutches or walker at home due to compensating for pain with ambulation left hip starting to hurt and history of hip replacement.  Consider PT/orthopedics evaluation if pain and swelling not resolving over next 2 weeks.  Call or return to clinic as needed if these symptoms worsen or fail to improve as anticipated.  Patient requested to return to work and have her xray results called to her.  Patient verbalized agreement and understanding of treatment plan and had no further questions at this time. P2:  ROM exercises, Stretching, and Hand out given    Olen Cordial, NP 04/21/15 1500

## 2015-04-21 NOTE — Discharge Instructions (Signed)
Contusion A contusion is a deep bruise. Contusions are the result of an injury that caused bleeding under the skin. The contusion may turn blue, purple, or yellow. Minor injuries will give you a painless contusion, but more severe contusions may stay painful and swollen for a few weeks.  CAUSES  A contusion is usually caused by a blow, trauma, or direct force to an area of the body. SYMPTOMS   Swelling and redness of the injured area.  Bruising of the injured area.  Tenderness and soreness of the injured area.  Pain. DIAGNOSIS  The diagnosis can be made by taking a history and physical exam. An X-ray, CT scan, or MRI may be needed to determine if there were any associated injuries, such as fractures. TREATMENT  Specific treatment will depend on what area of the body was injured. In general, the best treatment for a contusion is resting, icing, elevating, and applying cold compresses to the injured area. Over-the-counter medicines may also be recommended for pain control. Ask your caregiver what the best treatment is for your contusion. HOME CARE INSTRUCTIONS   Put ice on the injured area.  Put ice in a plastic bag.  Place a towel between your skin and the bag.  Leave the ice on for 15-20 minutes, 3-4 times a day, or as directed by your health care provider.  Only take over-the-counter or prescription medicines for pain, discomfort, or fever as directed by your caregiver. Your caregiver may recommend avoiding anti-inflammatory medicines (aspirin, ibuprofen, and naproxen) for 48 hours because these medicines may increase bruising.  Rest the injured area.  If possible, elevate the injured area to reduce swelling. SEEK IMMEDIATE MEDICAL CARE IF:   You have increased bruising or swelling.  You have pain that is getting worse.  Your swelling or pain is not relieved with medicines. MAKE SURE YOU:   Understand these instructions.  Will watch your condition.  Will get help right  away if you are not doing well or get worse. Document Released: 07/05/2005 Document Revised: 09/30/2013 Document Reviewed: 07/31/2011 [facility] Patient Information 2015 [place], [place]. This information is not intended to replace advice given to you by your health care provider. Make sure you discuss any questions you have with your health care provider.   Knee Sprain A knee sprain is a tear in one of the strong, fibrous tissues that connect the bones (ligaments) in your knee. The severity of the sprain depends on how much of the ligament is torn. The tear can be either partial or complete. CAUSES  Often, sprains are a result of a fall or injury. The force of the impact causes the fibers of your ligament to stretch too much. This excess tension causes the fibers of your ligament to tear. SIGNS AND SYMPTOMS  You may have some loss of motion in your knee. Other symptoms include: Bruising. Pain in the knee area. Tenderness of the knee to the touch. Swelling. DIAGNOSIS  To diagnose a knee sprain, your health care provider will physically examine your knee. Your health care provider may also suggest an X-ray exam of your knee to make sure no bones are broken. TREATMENT  If your ligament is only partially torn, treatment usually involves keeping the knee in a fixed position (immobilization) or bracing your knee for activities that require movement for several weeks. To do this, your health care provider will apply a bandage, cast, or splint to keep your knee from moving and to support your knee during movement until  it heals. For a partially torn ligament, the healing process usually takes 4-6 weeks. If your ligament is completely torn, depending on which ligament it is, you may need surgery to reconnect the ligament to the bone or reconstruct it. After surgery, a cast or splint may be applied and will need to stay on your knee for 4-6 weeks while your ligament heals. HOME CARE INSTRUCTIONS Keep your  injured knee elevated to decrease swelling. To ease pain and swelling, apply ice to the injured area: Put ice in a plastic bag. Place a towel between your skin and the bag. Leave the ice on for 20 minutes, 2-3 times a day. Only take medicine for pain as directed by your health care provider. Do not leave your knee unprotected until pain and stiffness go away (usually 4-6 weeks). If you have a cast or splint, do not allow it to get wet. If you have been instructed not to remove it, cover it with a plastic bag when you shower or bathe. Do not swim. Your health care provider may suggest exercises for you to do during your recovery to prevent or limit permanent weakness and stiffness. SEEK IMMEDIATE MEDICAL CARE IF: Your cast or splint becomes damaged. Your pain becomes worse. You have significant pain, swelling, or numbness below the cast or splint. MAKE SURE YOU: Understand these instructions. Will watch your condition. Will get help right away if you are not doing well or get worse. Document Released: 09/25/2005 Document Revised: 07/16/2013 Document Reviewed: 05/07/2013 [facility] Patient Information 2015 [place], [place]. This information is not intended to replace advice given to you by your health care provider. Make sure you discuss any questions you have with your health care provider.    Pain, numbness, coldness, or a blue, gray, or dark color occurs in the foot or toenails.  Increased pain, swelling, redness, drainage of fluids, or bleeding in the affected area.  Signs of infection (headache, muscle aches, dizziness, or a general ill feeling with fever).  New, unexplained symptoms develop. (Drugs used in treatment may produce side effects.) Document Released: 09/25/2005 Document Revised: 12/18/2011 Document Reviewed: 01/07/2009 [facility] Patient Information 2015 [place], [place]. This information is not intended to replace advice given to you by your health care provider. Make sure you  discuss any questions you have with your health care provider. Obesity Obesity is defined as having too much total body fat and a body mass index (BMI) of 30 or more. BMI is an estimate of body fat and is calculated from your height and weight. Obesity happens when you consume more calories than you can burn by exercising or performing daily physical tasks. Prolonged obesity can cause major illnesses or emergencies, such as:  Stroke. Heart disease. Diabetes. Cancer. Arthritis. High blood pressure (hypertension). High cholesterol. Sleep apnea. Erectile dysfunction. Infertility problems. CAUSES  Regularly eating unhealthy foods. Physical inactivity. Certain disorders, such as an underactive thyroid (hypothyroidism), Cushing's syndrome, and polycystic ovarian syndrome. Certain medicines, such as steroids, some depression medicines, and antipsychotics. Genetics. Lack of sleep. DIAGNOSIS  A health care provider can diagnose obesity after calculating your BMI. Obesity will be diagnosed if your BMI is 30 or higher.  There are other methods of measuring obesity levels. Some other methods include measuring your skinfold thickness, your waist circumference, and comparing your hip circumference to your waist circumference. TREATMENT  A healthy treatment program includes some or all of the following: Long-term dietary changes. Exercise and physical activity. Behavioral and lifestyle changes. Medicine only under the  supervision of your health care provider. Medicines may help, but only if they are used with diet and exercise programs. An unhealthy treatment program includes: Fasting. Fad diets. Supplements and drugs. These choices do not succeed in long-term weight control.  HOME CARE INSTRUCTIONS  Exercise and perform physical activity as directed by your health care provider. To increase physical activity, try the following: Use stairs instead of elevators. Park farther away from store  entrances. Garden, bike, or walk instead of watching television or using the computer. Eat healthy, low-calorie foods and drinks on a regular basis. Eat more fruits and vegetables. Use low-calorie cookbooks or take healthy cooking classes. Limit fast food, sweets, and processed snack foods. Eat smaller portions. Keep a daily journal of everything you eat. There are many free websites to help you with this. It may be helpful to measure your foods so you can determine if you are eating the correct portion sizes. Avoid drinking alcohol. Drink more water and drinks without calories. Take vitamins and supplements only as recommended by your health care provider. Weight-loss support groups, Tax adviser, counselors, and stress reduction education can also be very helpful. SEEK IMMEDIATE MEDICAL CARE IF: You have chest pain or tightness. You have trouble breathing or feel short of breath. You have weakness or leg numbness. You feel confused or have trouble talking. You have sudden changes in your vision. MAKE SURE YOU: Understand these instructions. Will watch your condition. Will get help right away if you are not doing well or get worse. Document Released: 11/02/2004 Document Revised: 02/09/2014 Document Reviewed: 11/01/2011 [facility] Patient Information 2015 [place], [place]. This information is not intended to replace advice given to you by your health care provider. Make sure you discuss any questions you have with your health care provider. Osteoarthritis Osteoarthritis is a disease that causes soreness and inflammation of a joint. It occurs when the cartilage at the affected joint wears down. Cartilage acts as a cushion, covering the ends of bones where they meet to form a joint. Osteoarthritis is the most common form of arthritis. It often occurs in older people. The joints affected most often by this condition include those in the:  Ends of the fingers.  Thumbs.  Neck.  Lower  back.  Knees.  Hips. CAUSES  Over time, the cartilage that covers the ends of bones begins to wear away. This causes bone to rub on bone, producing pain and stiffness in the affected joints.  RISK FACTORS Certain factors can increase your chances of having osteoarthritis, including:  Older age.  Excessive body weight.  Overuse of joints.  Previous joint injury. SIGNS AND SYMPTOMS   Pain, swelling, and stiffness in the joint.  Over time, the joint may lose its normal shape.  Small deposits of bone (osteophytes) may grow on the edges of the joint.  Bits of bone or cartilage can break off and float inside the joint space. This may cause more pain and damage. DIAGNOSIS  Your health care provider will do a physical exam and ask about your symptoms. Various tests may be ordered, such as:  X-rays of the affected joint.  An MRI scan.  Blood tests to rule out other types of arthritis.  Joint fluid tests. This involves using a needle to draw fluid from the joint and examining the fluid under a microscope. TREATMENT  Goals of treatment are to control pain and improve joint function. Treatment plans may include:  A prescribed exercise program that allows for rest and joint relief.  A weight control plan.  Pain relief techniques, such as:  Properly applied heat and cold.  Electric pulses delivered to nerve endings under the skin (transcutaneous electrical nerve stimulation [TENS]).  Massage.  Certain nutritional supplements.  Medicines to control pain, such as:  Acetaminophen.  Nonsteroidal anti-inflammatory drugs (NSAIDs), such as naproxen.  Narcotic or central-acting agents, such as tramadol.  Corticosteroids. These can be given orally or as an injection.  Surgery to reposition the bones and relieve pain (osteotomy) or to remove loose pieces of bone and cartilage. Joint replacement may be needed in advanced states of osteoarthritis. HOME CARE INSTRUCTIONS   Take  medicines only as directed by your health care provider.  Maintain a healthy weight. Follow your health care provider's instructions for weight control. This may include dietary instructions.  Exercise as directed. Your health care provider can recommend specific types of exercise. These may include:  Strengthening exercises. These are done to strengthen the muscles that support joints affected by arthritis. They can be performed with weights or with exercise bands to add resistance.  Aerobic activities. These are exercises, such as brisk walking or low-impact aerobics, that get your heart pumping.  Range-of-motion activities. These keep your joints limber.  Balance and agility exercises. These help you maintain daily living skills.  Rest your affected joints as directed by your health care provider.  Keep all follow-up visits as directed by your health care provider. SEEK MEDICAL CARE IF:   Your skin turns red.  You develop a rash in addition to your joint pain.  You have worsening joint pain.  You have a fever along with joint or muscle aches. SEEK IMMEDIATE MEDICAL CARE IF:  You have a significant loss of weight or appetite.  You have night sweats. [place] of Arthritis and Musculoskeletal and Skin Diseases: www.niams.SouthExposed.es  [employer] on Aging: http://kim-miller.com/  American College of Rheumatology: www.rheumatology.org Document Released: 09/25/2005 Document Revised: 02/09/2014 Document Reviewed: 06/02/2013 [facility] Patient Information 2015 [place], [place]. This information is not intended to replace advice given to you by your health care provider. Make sure you discuss any questions you have with your health care provider.

## 2015-04-21 NOTE — ED Notes (Signed)
Pt states "I fell at [employer] on July 3rd and landed on my right knee, my right knee and lower leg have been very bruised and swollen. I was trying not to go to the doctor but I am having persistent right knee pain." Positive bruising and swelling to Right Knee and lower leg.

## 2015-05-21 ENCOUNTER — Ambulatory Visit (INDEPENDENT_AMBULATORY_CARE_PROVIDER_SITE_OTHER): Payer: BLUE CROSS/BLUE SHIELD | Admitting: Internal Medicine

## 2015-05-21 ENCOUNTER — Encounter: Payer: Self-pay | Admitting: Internal Medicine

## 2015-05-21 VITALS — BP 126/85 | HR 93 | Temp 97.9°F | Wt 259.2 lb

## 2015-05-21 DIAGNOSIS — Z1239 Encounter for other screening for malignant neoplasm of breast: Secondary | ICD-10-CM | POA: Diagnosis not present

## 2015-05-21 DIAGNOSIS — Z1211 Encounter for screening for malignant neoplasm of colon: Secondary | ICD-10-CM | POA: Diagnosis not present

## 2015-05-21 DIAGNOSIS — Z0000 Encounter for general adult medical examination without abnormal findings: Secondary | ICD-10-CM | POA: Diagnosis not present

## 2015-05-21 DIAGNOSIS — F329 Major depressive disorder, single episode, unspecified: Secondary | ICD-10-CM | POA: Diagnosis not present

## 2015-05-21 DIAGNOSIS — F32A Depression, unspecified: Secondary | ICD-10-CM

## 2015-05-21 MED ORDER — BUPROPION HCL ER (SR) 150 MG PO TB12: 150.0000 mg | ORAL_TABLET | Freq: Two times a day (BID) | ORAL | Status: DC

## 2015-05-21 MED ORDER — FLUOXETINE HCL 20 MG PO TABS: 20.0000 mg | ORAL_TABLET | Freq: Every day | ORAL | Status: DC

## 2015-05-21 MED ORDER — PANTOPRAZOLE SODIUM 40 MG PO TBEC: 40.0000 mg | DELAYED_RELEASE_TABLET | Freq: Every day | ORAL | Status: DC

## 2015-05-21 NOTE — Assessment & Plan Note (Signed)
Mammogram ordered

## 2015-05-21 NOTE — Assessment & Plan Note (Signed)
Fasting labs ordered

## 2015-05-21 NOTE — Assessment & Plan Note (Signed)
Worsening symptoms of depression. Will add Fluoxetine to Wellbutrin. Will set up evaluation with Dr. Nicolasa Ducking. Follow up in 4 weeks and prn.

## 2015-05-21 NOTE — Progress Notes (Signed)
Subjective:    Patient ID: [name], female    DOB: 02/09/1956, 59 y.o.   MRN: [id number]  HPI  59YO female presents for follow up.  Recently started bee-keeping. Had local reaction with 5 stings. No systemic symptoms.  Depression - Notes that depression has been worsening over last few months. No suicidal ideation.Taking wellbutrin with minimal improvement. Her mother recently passed away. She has moved to [place] to live with her father. Work going well. Previously had counseling and medication management with Dr. Nicolasa Ducking. Would like to re-establish care.  GERD - Symptoms well controlled with Pantoprazole. Questions if she should continue on this medication given potential risk of dementia.  Past Medical History  Diagnosis Date  . Morbid obesity   . Anxiety   . GERD (gastroesophageal reflux disease)   . Arthritis     left hip  . Hepatitis 1972    hep B  . PONV (postoperative nausea and vomiting)    Family History  Problem Relation Age of Onset  . Cancer Maternal Aunt     ovarian cancer  . Hyperlipidemia Mother   . Obesity Mother   . Pulmonary fibrosis Mother   . Cancer Father    Past Surgical History  Procedure Laterality Date  . Arthroscopic repair acl      bilateral knees  . Abdominal hysterectomy  04/1999  . Breast lumpectomy  06/1979; 03/1995    left  . Cholecystectomy  09/1987  . Laparoscopic gastric sleeve resection  09/30/2012    Procedure: LAPAROSCOPIC GASTRIC SLEEVE RESECTION;  Surgeon: Madilyn Hook, DO;  Location: [facility];  Service: General;  Laterality: N/A;  . Upper gi endoscopy  09/30/2012    Procedure: UPPER GI ENDOSCOPY;  Surgeon: Madilyn Hook, DO;  Location: [facility];  Service: General;;  . Total hip arthroplasty  11/13/2012    left   Social History   Social History  . Marital Status: Married    Spouse Name: N/A  . Number of Children: N/A  . Years of Education: N/A   Social History Main Topics  . Smoking status: Former Smoker -- 0.25  packs/day for 5 years    Types: Cigarettes    Quit date: 07/25/2001  . Smokeless tobacco: Never Used  . Alcohol Use: No     Comment: rarely  . Drug Use: No  . Sexual Activity: Not Asked   Other Topics Concern  . None   Social History Narrative   Works for [employer] with spouse          Review of Systems  Constitutional: Negative for fever, chills, appetite change, fatigue and unexpected weight change.  Eyes: Negative for visual disturbance.  Respiratory: Negative for shortness of breath.   Cardiovascular: Negative for chest pain and leg swelling.  Gastrointestinal: Negative for nausea, vomiting, abdominal pain, diarrhea and constipation.  Musculoskeletal: Negative for myalgias and arthralgias.  Skin: Negative for color change and rash.  Hematological: Negative for adenopathy. Does not bruise/bleed easily.  Psychiatric/Behavioral: Positive for dysphoric mood. Negative for suicidal ideas and sleep disturbance. The patient is not nervous/anxious.        Objective:    BP 126/85 mmHg  Pulse 93  Temp(Src) 97.9 F (36.6 C) (Oral)  Wt 259 lb 4 oz (117.595 kg)  SpO2 96% Physical Exam  Constitutional: She is oriented to person, place, and time. She appears well-developed and well-nourished. No distress.  HENT:  Head: Normocephalic and atraumatic.  Right Ear: External ear normal.  Left Ear: External ear normal.  Nose: Nose normal.  Mouth/Throat: Oropharynx is clear and moist. No oropharyngeal exudate.  Eyes: Conjunctivae are normal. Pupils are equal, round, and reactive to light. Right eye exhibits no discharge. Left eye exhibits no discharge. No scleral icterus.  Neck: Normal range of motion. Neck supple. No tracheal deviation present. No thyromegaly present.  Cardiovascular: Normal rate, regular rhythm, normal heart sounds and intact distal pulses.  Exam reveals no gallop and no friction rub.   No murmur heard. Pulmonary/Chest: Effort normal and breath sounds  normal. No respiratory distress. She has no wheezes. She has no rales. She exhibits no tenderness.  Musculoskeletal: Normal range of motion. She exhibits no edema or tenderness.  Lymphadenopathy:    She has no cervical adenopathy.  Neurological: She is alert and oriented to person, place, and time. No cranial nerve deficit. She exhibits normal muscle tone. Coordination normal.  Skin: Skin is warm and dry. No rash noted. She is not diaphoretic. No erythema. No pallor.  Psychiatric: Her speech is normal and behavior is normal. Judgment and thought content normal. Cognition and memory are normal. She exhibits a depressed mood. She expresses no suicidal ideation.          Assessment & Plan:   Problem List Items Addressed This Visit      Unprioritized   Depression - Primary    Worsening symptoms of depression. Will add Fluoxetine to Wellbutrin. Will set up evaluation with Dr. Nicolasa Ducking. Follow up in 4 weeks and prn.      Relevant Medications   FLUoxetine (PROZAC) 20 MG tablet   buPROPion (WELLBUTRIN SR) 150 MG 12 hr tablet   Other Relevant Orders   Ambulatory referral to Psychiatry   Routine general medical examination at a health care facility    Fasting labs ordered.      Relevant Orders   CBC with Differential/Platelet   Comprehensive metabolic panel   Lipid panel   Vit D  25 hydroxy (rtn osteoporosis monitoring)   TSH   Hemoglobin A1c   Screening for breast cancer    Mammogram ordered.      Relevant Orders   MM Digital Screening   Special screening for malignant neoplasms, colon    Referral placed for colonoscopy.      Relevant Orders   Ambulatory referral to Gastroenterology       Return in about 4 weeks (around 06/18/2015) for Recheck.

## 2015-05-21 NOTE — Patient Instructions (Signed)
Start Fluoxetine 20mg  daily.  Continue Wellbutrin 150mg  twice daily.  We will set up evaluation with Dr. Nicolasa Ducking and Dr. Hilarie Fredrickson.  We will schedule a mammogram.

## 2015-05-21 NOTE — Assessment & Plan Note (Signed)
Referral placed for colonoscopy. 

## 2015-05-21 NOTE — Progress Notes (Signed)
Pre visit review using our clinic review tool, if applicable. No additional management support is needed unless otherwise documented below in the visit note. 

## 2015-05-24 ENCOUNTER — Encounter: Payer: Self-pay | Admitting: Internal Medicine

## 2015-05-25 ENCOUNTER — Encounter: Payer: Self-pay | Admitting: Internal Medicine

## 2015-05-26 ENCOUNTER — Other Ambulatory Visit: Payer: Self-pay | Admitting: *Deleted

## 2015-05-26 DIAGNOSIS — F32A Depression, unspecified: Secondary | ICD-10-CM

## 2015-05-26 DIAGNOSIS — F329 Major depressive disorder, single episode, unspecified: Secondary | ICD-10-CM

## 2015-05-26 MED ORDER — PANTOPRAZOLE SODIUM 40 MG PO TBEC: 40.0000 mg | DELAYED_RELEASE_TABLET | Freq: Every day | ORAL | Status: DC

## 2015-05-26 MED ORDER — BUPROPION HCL ER (SR) 150 MG PO TB12: 150.0000 mg | ORAL_TABLET | Freq: Two times a day (BID) | ORAL | Status: DC

## 2015-05-26 MED ORDER — FLUOXETINE HCL 20 MG PO TABS: 20.0000 mg | ORAL_TABLET | Freq: Every day | ORAL | Status: DC

## 2015-06-21 ENCOUNTER — Encounter: Payer: Self-pay | Admitting: Internal Medicine

## 2015-06-29 ENCOUNTER — Encounter: Payer: Self-pay | Admitting: Internal Medicine

## 2015-07-06 ENCOUNTER — Ambulatory Visit: Payer: BLUE CROSS/BLUE SHIELD | Attending: Internal Medicine

## 2015-07-08 ENCOUNTER — Ambulatory Visit (AMBULATORY_SURGERY_CENTER): Payer: Self-pay | Admitting: *Deleted

## 2015-07-08 VITALS — Ht 64.0 in | Wt 256.0 lb

## 2015-07-08 DIAGNOSIS — Z1211 Encounter for screening for malignant neoplasm of colon: Secondary | ICD-10-CM

## 2015-07-08 MED ORDER — NA SULFATE-K SULFATE-MG SULF 17.5-3.13-1.6 GM/177ML PO SOLN: 1.0000 | Freq: Once | ORAL | Status: DC

## 2015-07-08 NOTE — Progress Notes (Signed)
No egg or soy allergy. No anesthesia problems.  No home O2.  No diet meds.  

## 2015-07-19 ENCOUNTER — Encounter: Payer: Self-pay | Admitting: Internal Medicine

## 2015-07-22 ENCOUNTER — Ambulatory Visit (AMBULATORY_SURGERY_CENTER): Payer: BLUE CROSS/BLUE SHIELD | Admitting: Internal Medicine

## 2015-07-22 ENCOUNTER — Encounter: Payer: Self-pay | Admitting: Internal Medicine

## 2015-07-22 VITALS — BP 129/78 | HR 68 | Temp 97.4°F | Resp 17 | Ht 64.0 in | Wt 256.0 lb

## 2015-07-22 DIAGNOSIS — Z1211 Encounter for screening for malignant neoplasm of colon: Secondary | ICD-10-CM | POA: Diagnosis not present

## 2015-07-22 DIAGNOSIS — D120 Benign neoplasm of cecum: Secondary | ICD-10-CM

## 2015-07-22 DIAGNOSIS — D123 Benign neoplasm of transverse colon: Secondary | ICD-10-CM

## 2015-07-22 DIAGNOSIS — D122 Benign neoplasm of ascending colon: Secondary | ICD-10-CM

## 2015-07-22 MED ORDER — SODIUM CHLORIDE 0.9 % IV SOLN: 500.0000 mL | INTRAVENOUS | Status: DC

## 2015-07-22 NOTE — Op Note (Signed)
[facility]  [address] [place], [address]   COLONOSCOPY PROCEDURE REPORT  PATIENT: Janet, [name]  MR#: [id number] BIRTHDATE: 1956/10/07 , 40  yrs. old GENDER: female ENDOSCOPIST: Jerene Bears, MD REFERRED NO:[id number] Gilford Rile, M.D. PROCEDURE DATE:  07/22/2015 PROCEDURE:   Colonoscopy, screening, Colonoscopy with snare polypectomy, and Colonoscopy with cold biopsy polypectomy First Screening Colonoscopy - Avg.  risk and is 50 yrs.  old or older Yes.  Prior Negative Screening - Now for repeat screening. N/A  History of Adenoma - Now for follow-up colonoscopy & has been > or = to 3 yrs.  N/A  Polyps removed today? Yes ASA CLASS:   Class II INDICATIONS:Screening for colonic neoplasia, Colorectal Neoplasm Risk Assessment for this procedure is average risk, and 1st colonoscopy. MEDICATIONS: Monitored anesthesia care, Propofol 500 mg IV, and Lidocaine 200 mg IV  DESCRIPTION OF PROCEDURE:   After the risks benefits and alternatives of the procedure were thoroughly explained, informed consent was obtained.  The digital rectal exam revealed no rectal mass.   The LB [id number]  endoscope was introduced through the anus and advanced to the cecum, which was identified by both the appendix and ileocecal valve. No adverse events experienced. The quality of the prep was good.  (Suprep was used)  The instrument was then slowly withdrawn as the colon was fully examined. Estimated blood loss is zero unless otherwise noted in this procedure report.  COLON FINDINGS: A sessile polyp measuring 2 mm in size was found at the cecum.  A polypectomy was performed with cold forceps.  The resection was complete, the polyp tissue was completely retrieved and sent to histology.   A sessile polyp measuring 5 mm in size was found at the hepatic flexure.  A polypectomy was performed with a cold snare.  The resection was complete, the polyp tissue was completely retrieved and sent to  histology.   The examination was otherwise normal.  Retroflexed views revealed internal hemorrhoids. The time to cecum = 5.5 Withdrawal time = 13.5   The scope was withdrawn and the procedure completed. COMPLICATIONS: There were no immediate complications.  ENDOSCOPIC IMPRESSION: 1.   Sessile polyp was found at the cecum; polypectomy was performed with cold forceps 2.   Sessile polyp was found at the hepatic flexure; polypectomy was performed with a cold snare 3.   The examination was otherwise normal  RECOMMENDATIONS: 1.  Avoid all NSAIDS for the next 2 weeks. 2.  Await pathology results 3.  If the polyps removed today are proven to be adenomatous (pre-cancerous) polyps, you will need a repeat colonoscopy in 5 years.  Otherwise you should continue to follow colorectal cancer screening guidelines for "routine risk" patients with colonoscopy in 10 years.  You will receive a letter within 1-2 weeks with the results of your biopsy as well as final recommendations.  Please call my office if you have not received a letter after 3 weeks.  eSigned:  Jerene Bears, MD 07/22/2015 2:08 PM revisecc: The Patient and Ronette Deter MD

## 2015-07-22 NOTE — Patient Instructions (Signed)
YOU HAD AN ENDOSCOPIC PROCEDURE TODAY AT [facility]:   Refer to the procedure report that was given to you for any specific questions about what was found during the examination.  If the procedure report does not answer your questions, please call your gastroenterologist to clarify.  If you requested that your care partner not be given the details of your procedure findings, then the procedure report has been included in a sealed envelope for you to review at your convenience later.  YOU SHOULD EXPECT: Some feelings of bloating in the abdomen. Passage of more gas than usual.  Walking can help get rid of the air that was put into your GI tract during the procedure and reduce the bloating. If you had a lower endoscopy (such as a colonoscopy or flexible sigmoidoscopy) you may notice spotting of blood in your stool or on the toilet paper. If you underwent a bowel prep for your procedure, you may not have a normal bowel movement for a few days.  Please Note:  You might notice some irritation and congestion in your nose or some drainage.  This is from the oxygen used during your procedure.  There is no need for concern and it should clear up in a day or so.  SYMPTOMS TO REPORT IMMEDIATELY:   Following lower endoscopy (colonoscopy or flexible sigmoidoscopy):  Excessive amounts of blood in the stool  Significant tenderness or worsening of abdominal pains  Swelling of the abdomen that is new, acute  Fever of 100F or higher   For urgent or emergent issues, a gastroenterologist can be reached at any hour by calling [phone].   DIET: Your first meal following the procedure should be a small meal and then it is ok to progress to your normal diet. Heavy or fried foods are harder to digest and may make you feel nauseous or bloated.  Likewise, meals heavy in dairy and vegetables can increase bloating.  Drink plenty of fluids but you should avoid alcoholic beverages for 24  hours.  ACTIVITY:  You should plan to take it easy for the rest of today and you should NOT DRIVE or use heavy machinery until tomorrow (because of the sedation medicines used during the test).    FOLLOW UP: Our staff will call the number listed on your records the next business day following your procedure to check on you and address any questions or concerns that you may have regarding the information given to you following your procedure. If we do not reach you, we will leave a message.  However, if you are feeling well and you are not experiencing any problems, there is no need to return our call.  We will assume that you have returned to your regular daily activities without incident.  If any biopsies were taken you will be contacted by phone or by letter within the next 1-3 weeks.  Please call us at [phone] if you have not heard about the biopsies in 3 weeks.    SIGNATURES/CONFIDENTIALITY: You and/or your care partner have signed paperwork which will be entered into your electronic medical record.  These signatures attest to the fact that that the information above on your After Visit Summary has been reviewed and is understood.  Full responsibility of the confidentiality of this discharge information lies with you and/or your care-partner.  Polyp information given.  Avoid all NSAIDS for next two weeks.

## 2015-07-22 NOTE — Progress Notes (Signed)
Called to room to assist during endoscopic procedure.  Patient ID and intended procedure confirmed with present staff. Received instructions for my participation in the procedure from the performing physician.  

## 2015-07-22 NOTE — Progress Notes (Signed)
Report to PACU, RN, vss, BBS= Clear.  

## 2015-07-23 ENCOUNTER — Telehealth: Payer: Self-pay | Admitting: *Deleted

## 2015-07-23 NOTE — Telephone Encounter (Signed)
  Follow up Call-  Call back number 07/22/2015  Post procedure Call Back phone  # [phone]  Permission to leave phone message Yes     Patient questions:  Phone seemed to pick up, and nobody answered the phone.

## 2015-08-03 ENCOUNTER — Encounter: Payer: Self-pay | Admitting: Internal Medicine

## 2016-04-06 ENCOUNTER — Other Ambulatory Visit: Payer: Self-pay | Admitting: Internal Medicine

## 2016-12-01 ENCOUNTER — Emergency Department: Payer: BLUE CROSS/BLUE SHIELD

## 2016-12-01 ENCOUNTER — Emergency Department
Admission: EM | Admit: 2016-12-01 | Discharge: 2016-12-02 | Payer: BLUE CROSS/BLUE SHIELD | Attending: Emergency Medicine | Admitting: Emergency Medicine

## 2016-12-01 ENCOUNTER — Encounter: Payer: Self-pay | Admitting: Emergency Medicine

## 2016-12-01 DIAGNOSIS — Z7982 Long term (current) use of aspirin: Secondary | ICD-10-CM | POA: Diagnosis not present

## 2016-12-01 DIAGNOSIS — R519 Headache, unspecified: Secondary | ICD-10-CM

## 2016-12-01 DIAGNOSIS — R4182 Altered mental status, unspecified: Secondary | ICD-10-CM | POA: Diagnosis present

## 2016-12-01 DIAGNOSIS — R51 Headache: Secondary | ICD-10-CM

## 2016-12-01 DIAGNOSIS — Z79899 Other long term (current) drug therapy: Secondary | ICD-10-CM | POA: Insufficient documentation

## 2016-12-01 DIAGNOSIS — I609 Nontraumatic subarachnoid hemorrhage, unspecified: Secondary | ICD-10-CM | POA: Insufficient documentation

## 2016-12-01 DIAGNOSIS — Z87891 Personal history of nicotine dependence: Secondary | ICD-10-CM | POA: Diagnosis not present

## 2016-12-01 LAB — COMPREHENSIVE METABOLIC PANEL
ALBUMIN: 3.9 g/dL (ref 3.5–5.0)
ALK PHOS: 80 U/L (ref 38–126)
ALT: 23 U/L (ref 14–54)
ANION GAP: 7 (ref 5–15)
AST: 21 U/L (ref 15–41)
BILIRUBIN TOTAL: 0.6 mg/dL (ref 0.3–1.2)
BUN: 20 mg/dL (ref 6–20)
CO2: 25 mmol/L (ref 22–32)
Calcium: 8.8 mg/dL — ABNORMAL LOW (ref 8.9–10.3)
Chloride: 106 mmol/L (ref 101–111)
Creatinine, Ser: 0.88 mg/dL (ref 0.44–1.00)
GFR calc Af Amer: >60 mL/min (ref >60)
GFR calc non Af Amer: >60 mL/min (ref >60)
GLUCOSE: 144 mg/dL — AB (ref 65–99)
POTASSIUM: 4 mmol/L (ref 3.5–5.1)
SODIUM: 138 mmol/L (ref 135–145)
TOTAL PROTEIN: 7.4 g/dL (ref 6.5–8.1)

## 2016-12-01 LAB — PROTIME-INR
INR: 0.94
PROTHROMBIN TIME: 12.6 s (ref 11.4–15.2)

## 2016-12-01 LAB — CBC
HEMATOCRIT: 38.7 % (ref 35.0–47.0)
HEMOGLOBIN: 13.4 g/dL (ref 12.0–16.0)
MCH: 31.6 pg (ref 26.0–34.0)
MCHC: 34.6 g/dL (ref 32.0–36.0)
MCV: 91.2 fL (ref 80.0–100.0)
Platelets: 323 10*3/uL (ref 150–440)
RBC: 4.24 MIL/uL (ref 3.80–5.20)
RDW: 13.6 % (ref 11.5–14.5)
WBC: 11.1 10*3/uL — ABNORMAL HIGH (ref 3.6–11.0)

## 2016-12-01 LAB — GLUCOSE, CAPILLARY: Glucose-Capillary: 123 mg/dL — ABNORMAL HIGH (ref 65–99)

## 2016-12-01 MED ORDER — IOPAMIDOL (ISOVUE-370) INJECTION 76%
75.0000 mL | Freq: Once | INTRAVENOUS | Status: AC | PRN
  Administered 2016-12-01: 75 mL via INTRAVENOUS

## 2016-12-01 MED ORDER — SODIUM CHLORIDE 0.9 % IV SOLN
1000.0000 mg | Freq: Once | INTRAVENOUS | Status: AC
  Administered 2016-12-02: 1000 mg via INTRAVENOUS
  Filled 2016-12-01: qty 10

## 2016-12-01 NOTE — ED Provider Notes (Signed)
[facility] Emergency Department Provider Note  Time seen: 11:21 PM  I have reviewed the triage vital signs and the nursing notes.   HISTORY  Chief Complaint Altered Mental Status    HPI [name] is a 61 y.o. female with a past medical history of anxiety, gastric reflux, obesity, presents to the emergency department with sudden onset headache. According to the patient at 7:30 PM she got into bed to watch TV and suddenly felt a severe headache. Denies any nausea or vomiting. Husband states he almost seemed disoriented for several minutes. She is acting very tired currently states the headache is improved but still remains significant. Denies any history of migraines in the past. States she rarely if ever gets a headache. Denies any history of TIA or stroke in the past. Denies any focal weakness or numbness. Denies any confusion or slurred speech. Patient is alert and oriented in the emergency department cooperating with all exam commands.  Past Medical History:  Diagnosis Date  . Anxiety   . Arthritis    left hip  . GERD (gastroesophageal reflux disease)   . Hepatitis 1972   hep B  . Morbid obesity ([place])   . PONV (postoperative nausea and vomiting)     Patient Active Problem List   Diagnosis Date Noted  . Screening for breast cancer 05/21/2015  . Routine general medical examination at a health care facility 05/21/2015  . Special screening for malignant neoplasms, colon 01/13/2013  . Other and unspecified hyperlipidemia 12/19/2012  . Morbid obesity with BMI of 45.0-49.9, adult ([place]) 09/23/2012  . Depression 07/26/2011    Past Surgical History:  Procedure Laterality Date  . ABDOMINAL HYSTERECTOMY  04/1999  . ARTHROSCOPIC REPAIR ACL     bilateral knees  . BREAST LUMPECTOMY  06/1979; 03/1995   left  . CHOLECYSTECTOMY  09/1987  . LAPAROSCOPIC GASTRIC SLEEVE RESECTION  09/30/2012   Procedure: LAPAROSCOPIC GASTRIC SLEEVE RESECTION;  Surgeon:  Madilyn Hook, DO;  Location: [facility];  Service: General;  Laterality: N/A;  . TOTAL HIP ARTHROPLASTY  11/13/2012   left  . UPPER GI ENDOSCOPY  09/30/2012   Procedure: UPPER GI ENDOSCOPY;  Surgeon: Madilyn Hook, DO;  Location: [facility];  Service: General;;    Prior to Admission medications   Medication Sig Start Date End Date Taking? Authorizing Provider  acetaminophen (TYLENOL) 500 MG tablet Take 2 tablets (1,000 mg total) by mouth every 6 (six) hours as needed for mild pain or moderate pain. 04/21/15  Yes Olen Cordial, NP  aspirin EC 81 MG tablet Take 81 mg by mouth daily.   Yes Historical Provider, MD  buPROPion [facility] SR) 150 MG 12 hr tablet TAKE 1 TABLET TWICE A DAY 04/06/16  Yes Jackolyn Confer, MD  FLUoxetine (PROZAC) 20 MG capsule TAKE 1 CAPSULE DAILY 04/06/16  Yes Jackolyn Confer, MD  Multiple Vitamin (MULTIVITAMIN WITH MINERALS) TABS Take 1 tablet by mouth daily.    Yes Historical Provider, MD  Omega-3 Fatty Acids (FISH OIL) 1200 MG CAPS Take 1 capsule by mouth 2 (two) times daily.   Yes Historical Provider, MD  pantoprazole (PROTONIX) 40 MG tablet TAKE 1 TABLET DAILY 04/06/16  Yes Jackolyn Confer, MD    No Known Allergies  Family History  Problem Relation Age of Onset  . Hyperlipidemia Mother   . Obesity Mother   . Pulmonary fibrosis Mother   . Cancer Maternal Aunt     ovarian cancer  . Cancer Father   .  Colon cancer Paternal Grandmother   . Esophageal cancer Neg Hx   . Stomach cancer Neg Hx   . Rectal cancer Neg Hx     Social History Social History  Substance Use Topics  . Smoking status: Former Smoker    Packs/day: 0.25    Years: 5.00    Types: Cigarettes    Quit date: 07/25/2001  . Smokeless tobacco: Never Used  . Alcohol use 0.0 oz/week     Comment: 2 glasses of wine per week    Review of Systems Constitutional: Negative for fever. Cardiovascular: Negative for chest pain. Respiratory: Negative for shortness of breath. Gastrointestinal: Negative  for abdominal pain, vomiting  Neurological: Severe headache, now improved. Denies focal weakness or numbness. 10-point ROS otherwise negative.  ____________________________________________   PHYSICAL EXAM:  VITAL SIGNS: ED Triage Vitals  Enc Vitals Group     BP 12/01/16 2237 (!) 153/93     Pulse Rate 12/01/16 2237 82     Resp 12/01/16 2237 16     Temp 12/01/16 2237 97.6 F (36.4 C)     Temp Source 12/01/16 2237 Oral     SpO2 12/01/16 2230 94 %     Weight 12/01/16 2238 230 lb (104.3 kg)     Height 12/01/16 2238 5\' 3"  (1.6 m)     Head Circumference --      Peak Flow --      Pain Score 12/01/16 2238 8     Pain Loc --      Pain Edu? --      Excl. in [place]? --     Constitutional: Alert and oriented. Well appearing and in no distress.Keeps eyes closed throughout most of the exam due to headache. Eyes: Normal exam, 2-3 millimeter, no photophobia. PERRL. ENT   Head: Normocephalic and atraumatic.   Mouth/Throat: Mucous membranes are moist. Cardiovascular: Normal rate, regular rhythm. No murmur Respiratory: Normal respiratory effort without tachypnea nor retractions. Breath sounds are clear  Gastrointestinal: Soft and nontender. No distention.   Musculoskeletal: Nontender with normal range of motion in all extremities Neurologic:  Normal speech and language. No gross focal neurologic deficits. 5/5 motor in all extremities. No focal deficits identified. Cranial nerves intact. Skin:  Skin is warm, dry and intact.  Psychiatric: Mood and affect are normal.  ____________________________________________     RADIOLOGY  CT consistent with subarachnoid hemorrhage likely aneurysmal bleed.  ____________________________________________   INITIAL IMPRESSION / ASSESSMENT AND PLAN / ED COURSE  Pertinent labs & imaging results that were available during my care of the patient were reviewed by me and considered in my medical decision making (see chart for details).  Patient presents  to the emergency department with a sudden onset of severe headache. Patient states they have eaten dinner around 5:30 PM, they got home around 6:30 PM. States that put on pajamas and got into bed and watch TV when around 7:30 PM she developed a severe headache. States it was sudden onset. Denies LOC focal weakness or numbness. Denies fever. Currently the patient overall appears well with an intact neurological exam. She does keep her eyes closed through most of the exam but only does so because she states her head is hurting her. Given the patient's concerning headache we'll proceed with a CT angiography of the head and neck to further evaluate.  CT consistent with subarachnoid hemorrhage likely aneurysmal bleed given clinical presentation. I discussed the patient with [facility] neuro intensivist who has accepted the patient. He would like the patient  started on Cardene to maintain a systolic blood pressure of less than 140, we will give 1 g of Keppra, and continue with pain management as needed.  CRITICAL CARE Performed by: Harvest Dark   Total critical care time: 30 minutes  Critical care time was exclusive of separately billable procedures and treating other patients.  Critical care was necessary to treat or prevent imminent or life-threatening deterioration.  Critical care was time spent personally by me on the following activities: development of treatment plan with patient and/or surrogate as well as nursing, discussions with consultants, evaluation of patient's response to treatment, examination of patient, obtaining history from patient or surrogate, ordering and performing treatments and interventions, ordering and review of laboratory studies, ordering and review of radiographic studies, pulse oximetry and re-evaluation of patient's condition.   ____________________________________________   FINAL CLINICAL IMPRESSION(S) / ED DIAGNOSES  Headache Subarachnoid hemorrhage   Harvest Dark, MD 12/02/16 [phone]

## 2016-12-01 NOTE — ED Triage Notes (Signed)
Pt was eating with family tonight and she started having declined mental status changes with complaints of a headache that is a 9/10 on the pain scale for her.  Pt denies any cardiac or stroke hx.  Family states her mental status is not baseline.  Pt denies any drug or alcohol use.  Patient denies any numbness and is able to move all appendages on command with no deficits noted.

## 2016-12-01 NOTE — ED Notes (Signed)
Patient transported to CT 

## 2016-12-01 NOTE — ED Notes (Signed)
ED Provider at bedside. 

## 2016-12-02 ENCOUNTER — Ambulatory Visit (HOSPITAL_COMMUNITY)
Admission: AD | Admit: 2016-12-02 | Discharge: 2016-12-02 | Disposition: A | Discharge status: Home / Self Care | Payer: BLUE CROSS/BLUE SHIELD | Source: Other Acute Inpatient Hospital | Attending: Emergency Medicine | Admitting: Emergency Medicine

## 2016-12-02 DIAGNOSIS — I609 Nontraumatic subarachnoid hemorrhage, unspecified: Secondary | ICD-10-CM | POA: Insufficient documentation

## 2016-12-02 MED ORDER — NICARDIPINE HCL IN NACL 20-0.86 MG/200ML-% IV SOLN
3.0000 mg/h | Freq: Once | INTRAVENOUS | Status: AC
Start: 2016-12-02 — End: 2016-12-02
  Administered 2016-12-02: 5 mg/h via INTRAVENOUS
  Filled 2016-12-02: qty 200

## 2016-12-02 MED ORDER — MORPHINE SULFATE (PF) 4 MG/ML IV SOLN
4.0000 mg | Freq: Once | INTRAVENOUS | Status: AC
  Administered 2016-12-02: 4 mg via INTRAVENOUS
  Filled 2016-12-02: qty 1

## 2016-12-02 MED ORDER — ONDANSETRON HCL 4 MG/2ML IJ SOLN
4.0000 mg | Freq: Once | INTRAMUSCULAR | Status: AC
Start: 2016-12-02 — End: 2016-12-02
  Administered 2016-12-02: 4 mg via INTRAVENOUS
  Filled 2016-12-02: qty 2

## 2016-12-02 NOTE — ED Notes (Signed)
[facility] arrived to take patient to [facility]

## 2016-12-02 NOTE — ED Notes (Signed)
Report given to [facility] by Illene Bolus, [place]

## 2016-12-03 DIAGNOSIS — I10 Essential (primary) hypertension: Secondary | ICD-10-CM | POA: Insufficient documentation

## 2016-12-08 DIAGNOSIS — I67848 Other cerebrovascular vasospasm and vasoconstriction: Secondary | ICD-10-CM | POA: Insufficient documentation

## 2016-12-08 DIAGNOSIS — I959 Hypotension, unspecified: Secondary | ICD-10-CM | POA: Insufficient documentation

## 2016-12-12 DIAGNOSIS — D72829 Elevated white blood cell count, unspecified: Secondary | ICD-10-CM | POA: Insufficient documentation

## 2016-12-22 DIAGNOSIS — Z8673 Personal history of transient ischemic attack (TIA), and cerebral infarction without residual deficits: Secondary | ICD-10-CM | POA: Insufficient documentation

## 2016-12-22 DIAGNOSIS — I639 Cerebral infarction, unspecified: Secondary | ICD-10-CM | POA: Insufficient documentation

## 2016-12-29 ENCOUNTER — Telehealth: Payer: Self-pay | Admitting: Internal Medicine

## 2016-12-29 NOTE — Telephone Encounter (Signed)
First attempt at TCM has made no answer left message for return call. Due to insurance patient is no t eligible for TCM  Patient is [employer].

## 2016-12-29 NOTE — Telephone Encounter (Signed)
[facility] called and scheduled pt for a HFU for a stroke. Pt is schedule with M. Arnett on 4/2 @ 11:15.

## 2017-01-02 NOTE — Progress Notes (Addendum)
Subjective:    Patient ID: [name], female    DOB: 10/13/1955, 61 y.o.   MRN: [id number]  CC: [name] is a 61 y.o. female who presents today for follow up.   HPI: Patient has a complex past medical history of hospital admission 12/01/16 subarachnoid hemorrhage after intense, acute onset of HA. Hospital stay was compliated by left frontal infarct further complicated by severe vasospasms, acute kidney injury, fungal UTI, and altered mental status. She was transferred to [place] 3/16- 3/22 for PT, OT. Rehabilitation noted patient's mobility status was limited with decreased standing balance and aphasia. She was discharged to home 3/23 with assistance from family.  CT head and neck angiogram 12/01/16  demonstrated subarachnoid hemorrhage and mild enlargement of the bilateral ventricles. Also showed layering of blood around the left greater than right basal cisterns. No evidence of stenosis or aneurysm in vascular system. Common carotids were normal bilaterally.    ICA was noted there is a small 3 mm meter aneurysmal segment   CT brain 2/27 shows new acute infarction, decrease in [facility]  CT head angiogram 12/07/16 Shows severe vasospasm. No definitive aneurysms noted. Stable inferior left frontal lobe infarct  Not eligible for TCM due to insurance.   Feels well today and no complaints. She starts outpatient for physical therapy soon.   Doing well on prozac and wellbutrin and would like refills             HISTORY:  Past Medical History:  Diagnosis Date  . Anxiety   . Arthritis    left hip  . GERD (gastroesophageal reflux disease)   . Hepatitis 1972   hep B  . Morbid obesity ([place])   . PONV (postoperative nausea and vomiting)    Past Surgical History:  Procedure Laterality Date  . ABDOMINAL HYSTERECTOMY  04/1999  . ARTHROSCOPIC REPAIR ACL     bilateral knees  . BREAST LUMPECTOMY  06/1979; 03/1995   left  . CHOLECYSTECTOMY   09/1987  . LAPAROSCOPIC GASTRIC SLEEVE RESECTION  09/30/2012   Procedure: LAPAROSCOPIC GASTRIC SLEEVE RESECTION;  Surgeon: Madilyn Hook, DO;  Location: [facility];  Service: General;  Laterality: N/A;  . TOTAL HIP ARTHROPLASTY  11/13/2012   left  . UPPER GI ENDOSCOPY  09/30/2012   Procedure: UPPER GI ENDOSCOPY;  Surgeon: Madilyn Hook, DO;  Location: [facility];  Service: General;;   Family History  Problem Relation Age of Onset  . Hyperlipidemia Mother   . Obesity Mother   . Pulmonary fibrosis Mother   . Cancer Maternal Aunt     ovarian cancer  . Cancer Father   . Colon cancer Paternal Grandmother   . Esophageal cancer Neg Hx   . Stomach cancer Neg Hx   . Rectal cancer Neg Hx     Allergies: Patient has no known allergies. Current Outpatient Prescriptions on File Prior to Visit  Medication Sig Dispense Refill  . acetaminophen (TYLENOL) 500 MG tablet Take 2 tablets (1,000 mg total) by mouth every 6 (six) hours as needed for mild pain or moderate pain. 30 tablet 0  . aspirin EC 81 MG tablet Take 81 mg by mouth daily.    . Multiple Vitamin (MULTIVITAMIN WITH MINERALS) TABS Take 1 tablet by mouth daily.     . Omega-3 Fatty Acids (FISH OIL) 1200 MG CAPS Take 1 capsule by mouth 2 (two) times daily.    . pantoprazole (PROTONIX) 40 MG tablet TAKE 1 TABLET DAILY 90 tablet 2  No current facility-administered medications on file prior to visit.     Social History  Substance Use Topics  . Smoking status: Former Smoker    Packs/day: 0.25    Years: 5.00    Types: Cigarettes    Quit date: 07/25/2001  . Smokeless tobacco: Never Used  . Alcohol use 0.0 oz/week     Comment: 2 glasses of wine per week    Review of Systems  Constitutional: Negative for chills and fever.  Respiratory: Negative for cough.   Cardiovascular: Negative for chest pain and palpitations.  Gastrointestinal: Negative for nausea and vomiting.  Neurological: Negative for headaches.      Objective:    BP 122/90   Pulse  85   Temp 98.1 F (36.7 C) (Oral)   Ht 5\' 3"  (1.6 m)   Wt 243 lb (110.2 kg)   SpO2 98%   BMI 43.05 kg/m  BP Readings from Last 3 Encounters:  01/08/17 122/90  12/02/16 (!) 145/103  07/22/15 129/78   Wt Readings from Last 3 Encounters:  01/08/17 243 lb (110.2 kg)  12/01/16 230 lb (104.3 kg)  07/22/15 256 lb (116.1 kg)    Physical Exam  Constitutional: She appears well-developed and well-nourished.  Eyes: Conjunctivae are normal.  Cardiovascular: Normal rate, regular rhythm, normal heart sounds and normal pulses.   Pulmonary/Chest: Effort normal and breath sounds normal. She has no wheezes. She has no rhonchi. She has no rales.  Neurological: She is alert.  Skin: Skin is warm and dry.  Psychiatric: She has a normal mood and affect. Her speech is normal and behavior is normal. Thought content normal.  Vitals reviewed.      Assessment & Plan:   Problem List Items Addressed This Visit      Cardiovascular and Mediastinum   Aneurysm, carotid artery, internal    Small. Seen on CT head and neck. Due to h/o SAH, patient and I felt most comfortable with having vascular follow up to ensure no further surveillance or intervention needed.       Relevant Orders   Ambulatory referral to Vascular Surgery     Nervous and Auditory   SAH (subarachnoid hemorrhage) ([place]) - Primary    Please see to how well patient is speaking and responding to questions. She is doing very well. Due to her remaining deficits , primarily slow recall of words, we agreed consult with neurology to ensure no further surveillance at this time.       Relevant Orders   Ambulatory referral to Neurology   CBC with Differential/Platelet   Comprehensive metabolic panel     Other   Depression    Stable. Will continue current regimen.       Relevant Medications   FLUoxetine (PROZAC) 20 MG capsule   buPROPion (WELLBUTRIN SR) 150 MG 12 hr tablet   Screening for breast cancer    Ordered. Patient understands to  schedule.       Relevant Orders   MM DIGITAL SCREENING BILATERAL       I have changed Ms. Hartsell's FLUoxetine and buPROPion. I am also having her maintain her multivitamin with minerals, Fish Oil, acetaminophen, pantoprazole, and aspirin EC.   Meds ordered this encounter  Medications  . FLUoxetine (PROZAC) 20 MG capsule    Sig: Take 1 capsule (20 mg total) by mouth daily.    Dispense:  90 capsule    Refill:  1    Order Specific Question:   Supervising Provider    Answer:  TULLO, TERESA L [[id number]]  . buPROPion (WELLBUTRIN SR) 150 MG 12 hr tablet    Sig: Take 1 tablet (150 mg total) by mouth 2 (two) times daily.    Dispense:  180 tablet    Refill:  1    Order Specific Question:   Supervising Provider    Answer:   Crecencio Mc [[id number]]    Return precautions given.   Risks, benefits, and alternatives of the medications and treatment plan prescribed today were discussed, and patient expressed understanding.   Education regarding symptom management and diagnosis given to patient on AVS.  Continue to follow with Mable Paris, FNP for routine health maintenance.   Herby Abraham and I agreed with plan.   Mable Paris, FNP

## 2017-01-08 ENCOUNTER — Other Ambulatory Visit: Payer: Self-pay | Admitting: Family

## 2017-01-08 ENCOUNTER — Ambulatory Visit (INDEPENDENT_AMBULATORY_CARE_PROVIDER_SITE_OTHER): Payer: BLUE CROSS/BLUE SHIELD | Admitting: Family

## 2017-01-08 ENCOUNTER — Encounter: Payer: Self-pay | Admitting: Family

## 2017-01-08 VITALS — BP 122/90 | HR 85 | Temp 98.1°F | Ht 63.0 in | Wt 243.0 lb

## 2017-01-08 DIAGNOSIS — F3342 Major depressive disorder, recurrent, in full remission: Secondary | ICD-10-CM

## 2017-01-08 DIAGNOSIS — Z1231 Encounter for screening mammogram for malignant neoplasm of breast: Secondary | ICD-10-CM | POA: Diagnosis not present

## 2017-01-08 DIAGNOSIS — N179 Acute kidney failure, unspecified: Secondary | ICD-10-CM | POA: Insufficient documentation

## 2017-01-08 DIAGNOSIS — I671 Cerebral aneurysm, nonruptured: Secondary | ICD-10-CM

## 2017-01-08 DIAGNOSIS — I609 Nontraumatic subarachnoid hemorrhage, unspecified: Secondary | ICD-10-CM | POA: Diagnosis not present

## 2017-01-08 DIAGNOSIS — Z1239 Encounter for other screening for malignant neoplasm of breast: Secondary | ICD-10-CM

## 2017-01-08 LAB — CBC WITH DIFFERENTIAL/PLATELET
BASOS PCT: 0.8 % (ref 0.0–3.0)
Basophils Absolute: 0.1 10*3/uL (ref 0.0–0.1)
EOS ABS: 0.3 10*3/uL (ref 0.0–0.7)
Eosinophils Relative: 3.2 % (ref 0.0–5.0)
HEMATOCRIT: 35.6 % — AB (ref 36.0–46.0)
Hemoglobin: 11.9 g/dL — ABNORMAL LOW (ref 12.0–15.0)
LYMPHS ABS: 2.5 10*3/uL (ref 0.7–4.0)
Lymphocytes Relative: 29.3 % (ref 12.0–46.0)
MCHC: 33.5 g/dL (ref 30.0–36.0)
MCV: 92.9 fl (ref 78.0–100.0)
Monocytes Absolute: 0.8 10*3/uL (ref 0.1–1.0)
Monocytes Relative: 8.9 % (ref 3.0–12.0)
NEUTROS ABS: 4.9 10*3/uL (ref 1.4–7.7)
NEUTROS PCT: 57.8 % (ref 43.0–77.0)
PLATELETS: 313 10*3/uL (ref 150.0–400.0)
RBC: 3.83 Mil/uL — ABNORMAL LOW (ref 3.87–5.11)
RDW: 13.9 % (ref 11.5–15.5)
WBC: 8.5 10*3/uL (ref 4.0–10.5)

## 2017-01-08 LAB — COMPREHENSIVE METABOLIC PANEL
ALT: 29 U/L (ref 0–35)
AST: 19 U/L (ref 0–37)
Albumin: 3.7 g/dL (ref 3.5–5.2)
Alkaline Phosphatase: 88 U/L (ref 39–117)
BUN: 9 mg/dL (ref 6–23)
CALCIUM: 9.6 mg/dL (ref 8.4–10.5)
CHLORIDE: 105 meq/L (ref 96–112)
CO2: 29 meq/L (ref 19–32)
CREATININE: 1.62 mg/dL — AB (ref 0.40–1.20)
GFR: 34.49 mL/min — AB (ref >60.00)
Glucose, Bld: 102 mg/dL — ABNORMAL HIGH (ref 70–99)
Potassium: 4 mEq/L (ref 3.5–5.1)
Sodium: 142 mEq/L (ref 135–145)
Total Bilirubin: 0.4 mg/dL (ref 0.2–1.2)
Total Protein: 6.8 g/dL (ref 6.0–8.3)

## 2017-01-08 MED ORDER — BUPROPION HCL ER (SR) 150 MG PO TB12: 150.0000 mg | ORAL_TABLET | Freq: Two times a day (BID) | ORAL | 1 refills | Status: DC

## 2017-01-08 MED ORDER — FLUOXETINE HCL 20 MG PO CAPS: 20.0000 mg | ORAL_CAPSULE | Freq: Every day | ORAL | 1 refills | Status: DC

## 2017-01-08 NOTE — Patient Instructions (Addendum)
Referrals  Labs  Ensure you make a follow up appointment to establish care with one [place] and ALSO to have physical and pap smear.   We placed a referral. Mammogram this year. I asked that you call one the below locations and schedule this when it is convenient for you.   If you have dense breasts, you may ask for 3D mammogram over the traditional 2D mammogram as new evidence suggest 3D is superior. Please note that NOT all insurance companies cover 3D and you may have to pay a higher copay. You may call your insurance company to further clarify your benefits.   Options for [place]  [place], [place]  * [facility] [place], [place] * Note if you ask for 3D mammogram at this location, you must request [place], [place] location*

## 2017-01-08 NOTE — Assessment & Plan Note (Signed)
Small. Seen on CT head and neck. Due to h/o SAH, patient and I felt most comfortable with having vascular follow up to ensure no further surveillance or intervention needed.

## 2017-01-08 NOTE — Progress Notes (Signed)
Pre visit review using our clinic review tool, if applicable. No additional management support is needed unless otherwise documented below in the visit note. 

## 2017-01-08 NOTE — Assessment & Plan Note (Addendum)
Ordered. Patient understands to schedule 

## 2017-01-08 NOTE — Assessment & Plan Note (Signed)
Stable. Will continue current regimen.  

## 2017-01-08 NOTE — Assessment & Plan Note (Signed)
Please see to how well patient is speaking and responding to questions. She is doing very well. Due to her remaining deficits , primarily slow recall of words, we agreed consult with neurology to ensure no further surveillance at this time.

## 2017-01-25 ENCOUNTER — Ambulatory Visit: Payer: BLUE CROSS/BLUE SHIELD | Admitting: Neurology

## 2017-01-31 ENCOUNTER — Encounter: Payer: Self-pay | Admitting: Family

## 2017-02-07 ENCOUNTER — Encounter: Payer: Self-pay | Admitting: Neurology

## 2017-02-07 ENCOUNTER — Ambulatory Visit (INDEPENDENT_AMBULATORY_CARE_PROVIDER_SITE_OTHER): Payer: BLUE CROSS/BLUE SHIELD | Admitting: Neurology

## 2017-02-07 VITALS — BP 137/103 | HR 101 | Ht 62.0 in | Wt 244.0 lb

## 2017-02-07 DIAGNOSIS — I609 Nontraumatic subarachnoid hemorrhage, unspecified: Secondary | ICD-10-CM

## 2017-02-07 NOTE — Progress Notes (Signed)
Reason for visit: Subarachnoid hemorrhage  Referring physician: Dr. Blondell Reveal [name] is a 61 y.o. female  History of present illness:  [name] is a 70 year old right-handed white female with a history of a spontaneous subarachnoid hemorrhage that occurred on 12/01/2016. [name] went to Janet emergency room after onset of Janet hemorrhage associated with severe headache and some confusion. [name] underwent a CT scan of Janet brain that confirmed Janet subarachnoid hemorrhage, she was transferred to [facility]. Cerebral angiogram did not identify a causal etiology of Janet subarachnoid hemorrhage, she did not have an aneurysm. [name] sustained a left frontal stroke shortly after a cerebral angiogram. [name] did have documented vasospasm. [name] has finally returned home, she underwent a brief course of physical and speech therapy. Initially she had some word finding problems but this has normalized in Janet last several weeks. [name] does not have any residual numbness or weakness or balance problems or difficulty controlling Janet bowels or Janet bladder. She denies any headaches, visual changes, or difficulty with swallowing. Her memory has been excellent. [name] has noted a mild abulia following Janet subarachnoid hemorrhage, but even this is improving dramatically. [name] is planning on returning to work within Janet next month.  Past Medical History:  Diagnosis Date  . Anxiety   . Arthritis    left hip  . GERD (gastroesophageal reflux disease)   . Hepatitis 1972   hep B  . Morbid obesity ([place])   . PONV (postoperative nausea and vomiting)     Past Surgical History:  Procedure Laterality Date  . ABDOMINAL HYSTERECTOMY  04/1999  . ARTHROSCOPIC REPAIR ACL     bilateral knees  . BREAST LUMPECTOMY  06/1979; 03/1995   left  . CHOLECYSTECTOMY  09/1987  . LAPAROSCOPIC GASTRIC SLEEVE RESECTION  09/30/2012   Procedure: LAPAROSCOPIC GASTRIC SLEEVE  RESECTION;  Surgeon: Madilyn Hook, DO;  Location: [facility];  Service: General;  Laterality: N/A;  . TOTAL HIP ARTHROPLASTY  11/13/2012   left  . UPPER GI ENDOSCOPY  09/30/2012   Procedure: UPPER GI ENDOSCOPY;  Surgeon: Madilyn Hook, DO;  Location: [facility];  Service: General;;    Family History  Problem Relation Age of Onset  . Hyperlipidemia Mother   . Obesity Mother   . Pulmonary fibrosis Mother   . Cancer Maternal Aunt     ovarian cancer  . Cancer Father   . Colon cancer Paternal Grandmother   . Esophageal cancer Neg Hx   . Stomach cancer Neg Hx   . Rectal cancer Neg Hx     Social history:  reports that she quit smoking about 15 years ago. Her smoking use included Cigarettes. She has a 1.25 pack-year smoking history. She has never used smokeless tobacco. She reports that she drinks alcohol. She reports that she does not use drugs.  Medications:  Prior to Admission medications   Medication Sig Start Date End Date Taking? Authorizing Provider  acetaminophen (TYLENOL) 500 MG tablet Take 2 tablets (1,000 mg total) by mouth every 6 (six) hours as needed for mild pain or moderate pain. 04/21/15  Yes Olen Cordial, NP  aspirin EC 81 MG tablet Take 81 mg by mouth daily.   Yes Historical Provider, MD  buPROPion (WELLBUTRIN SR) 150 MG 12 hr tablet Take 1 tablet (150 mg total) by mouth 2 (two) times daily. 01/08/17  Yes Burnard Hawthorne, FNP  FLUoxetine (PROZAC) 20 MG capsule Take 1 capsule (20  mg total) by mouth daily. 01/08/17  Yes Burnard Hawthorne, FNP  Multiple Vitamin (MULTIVITAMIN WITH MINERALS) TABS Take 1 tablet by mouth daily.    Yes Historical Provider, MD  Omega-3 Fatty Acids (FISH OIL) 1200 MG CAPS Take 1 capsule by mouth 2 (two) times daily.   Yes Historical Provider, MD  pantoprazole (PROTONIX) 40 MG tablet TAKE 1 TABLET DAILY 04/06/16  Yes Jackolyn Confer, MD     No Known Allergies  ROS:  Out of a complete 14 system review of symptoms, [name] complains only of Janet  following symptoms, and all other reviewed systems are negative.  Memory loss  Blood pressure (!) 137/103, pulse (!) 101, height 5\' 2"  (1.575 m), weight 244 lb (110.7 kg).  Physical Exam  General: [name] is alert and cooperative at Janet time of Janet examination. [name] is moderately obese.  Eyes: Pupils are equal, round, and reactive to light. Discs are flat bilaterally.  Neck: Janet neck is supple, no carotid bruits are noted.  Respiratory: Janet respiratory examination is clear.  Cardiovascular: Janet cardiovascular examination reveals a regular rate and rhythm, no obvious murmurs or rubs are noted.  Skin: Extremities are without significant edema.  Neurologic Exam  Mental status: [name] is alert and oriented x 3 at Janet time of Janet examination. [name] has apparent normal recent and remote memory, with an apparently normal attention span and concentration ability.  Cranial nerves: Facial symmetry is present. There is good sensation of Janet face to pinprick and soft touch bilaterally. Janet strength of Janet facial muscles and Janet muscles to head turning and shoulder shrug are normal bilaterally. Speech is well enunciated, no aphasia or dysarthria is noted. Extraocular movements are full. Visual fields are full. Janet tongue is midline, and [name] has symmetric elevation of Janet soft palate. No obvious hearing deficits are noted.  Motor: Janet motor testing reveals 5 over 5 strength of all 4 extremities. Good symmetric motor tone is noted throughout.  Sensory: Sensory testing is intact to pinprick, soft touch, vibration sensation, and position sense on all 4 extremities, with exception of a decrease in position sense of Janet right foot. No evidence of extinction is noted.  Coordination: Cerebellar testing reveals good finger-nose-finger and heel-to-shin bilaterally.  Gait and station: Gait is normal. Tandem gait is normal. Romberg is negative. No drift is seen.  Reflexes: Deep  tendon reflexes are symmetric and normal bilaterally. Toes are downgoing bilaterally.   CT head 12/01/16:  IMPRESSION: 1. Subarachnoid hemorrhage which in Janet absence of history of trauma is suspicious for ruptured aneurysm. CT angiography is recommended for further evaluation. 2. Age-related atrophy and chronic microvascular ischemic changes. No mass effect or midline shift.  * CT scan images were reviewed online. I agree with Janet written report.    Assessment/Plan:  1. Non-aneurysmal subarachnoid hemorrhage  [name] has done well following Janet subarachnoid hemorrhage. Janet prognosis should be good. Further blood work will be done to exclude vasculitic process or hypercoagulable state. [name] will follow-up through this office if needed.  Jill Alexanders MD 02/07/2017 9:33 AM  [facility] [address] [place], [place] [address]  Phone [phone] Fax [phone]

## 2017-02-21 ENCOUNTER — Ambulatory Visit
Admission: RE | Admit: 2017-02-21 | Discharge: 2017-02-21 | Disposition: A | Discharge status: Home / Self Care | Payer: BLUE CROSS/BLUE SHIELD | Source: Ambulatory Visit | Attending: Family | Admitting: Family

## 2017-02-21 DIAGNOSIS — Z1231 Encounter for screening mammogram for malignant neoplasm of breast: Secondary | ICD-10-CM | POA: Diagnosis not present

## 2017-02-21 DIAGNOSIS — Z1239 Encounter for other screening for malignant neoplasm of breast: Secondary | ICD-10-CM

## 2017-02-21 LAB — HYPERCOAGULABLE PANEL, COMPREHENSIVE
ACT. PRT C RESIST W/FV DEFIC.: 3 ratio
APTT: 24.3 s
AT III Act/Nor PPP Chro: 122 %
Anticardiolipin Ab, IgG: <10 [GPL'U]
Anticardiolipin Ab, IgM: <10 [MPL'U]
Beta-2 Glycoprotein I, IgM: <10 SMU
DRVVT Screen Seconds: 41 s
FACTOR VII ANTIGEN: 178 % — AB
FACTOR VIII ACTIVITY: 214 % — AB
HOMOCYSTEINE: 9.9 umol/L
Hexagonal Phospholipid Neutral: 5 s
PROT S AG ACT/NOR PPP IMM: 117 %
PROTEIN S AG/FVII AG RATIO: 0.7 ratio
Prot C Ag Act/Nor PPP Imm: 125 %
Protein C Ag/FVII Ag Ratio**: 0.7 ratio

## 2017-02-21 LAB — PAN-ANCA
ANCA Proteinase 3: <3.5 U/mL (ref 0.0–3.5)
Atypical pANCA: <1:20 {titer}
C-ANCA: <1:20 {titer}
Myeloperoxidase Ab: <9 U/mL (ref 0.0–9.0)
P-ANCA: <1:20 {titer}

## 2017-02-21 LAB — ANA W/REFLEX: Anti Nuclear Antibody(ANA): NEGATIVE

## 2017-02-21 LAB — SEDIMENTATION RATE: SED RATE: 10 mm/h (ref 0–40)

## 2017-02-27 ENCOUNTER — Telehealth: Payer: Self-pay | Admitting: Neurology

## 2017-02-27 DIAGNOSIS — G252 Other specified forms of tremor: Secondary | ICD-10-CM

## 2017-02-27 NOTE — Telephone Encounter (Signed)
Gave completed/signed form to Angela Nevin in medical records to process for patient.

## 2017-02-27 NOTE — Telephone Encounter (Signed)
I called patient. We will fill out the form for work, she can start to work on 03/06/2017.  The patient describes an event that occurred several days ago where she woke her husband up with shaking while sleeping. The husband took about 5 minutes to wake her up, the patient had no headache, muscle soreness, tongue biting, or bowel or bladder control issues following this.  Not clear what this event represented, I will check an EEG study.

## 2017-03-22 ENCOUNTER — Other Ambulatory Visit: Payer: BLUE CROSS/BLUE SHIELD

## 2017-04-02 ENCOUNTER — Telehealth: Payer: Self-pay | Admitting: Neurology

## 2017-04-02 NOTE — Telephone Encounter (Signed)
Called and spoke with patient. She was outside working on yard work this past weekend. She states she was bending over, blacked out. Not aware of incident. Husband woke her up. Unconscious less than a min per husband. Thought it might be dehydration. She denies starting any new medication. Denies any more episodes.  Advised it sounds like she was dehydrated.  Patient agrees but wanted to make sure with Dr Jannifer Franklin. Not sure if she should come in for appt.  Advised I will check with him. We will call back to advise

## 2017-04-02 NOTE — Telephone Encounter (Signed)
I called the patient, left a message. I will try to get her worked in as soon as we can, it may not be until next week.

## 2017-04-02 NOTE — Telephone Encounter (Signed)
Called and LVM for pt to call office to set up f/u. Advised office now closed, but we open tomorrow at 8am. Can offer 04/09/17 at 730am or 04/10/17 at 730am. Those are the only available appt until week of 04/16/17.

## 2017-04-02 NOTE — Telephone Encounter (Signed)
Patient called office in reference to working outside and blacked out falling lasted a few seconds husband was outside with patient when it happened and work patient up.  Patient is requesting to be seen with Dr. Jannifer Franklin.  Please call

## 2017-04-03 NOTE — Telephone Encounter (Signed)
Called and spoke with pt. Scheduled appt for 04/09/17 at 730am, check in 715am. Pt verbalized understanding and appreciation for call.

## 2017-04-08 ENCOUNTER — Emergency Department
Admission: EM | Admit: 2017-04-08 | Discharge: 2017-04-08 | Disposition: A | Discharge status: Home / Self Care | Payer: BLUE CROSS/BLUE SHIELD | Attending: Emergency Medicine | Admitting: Emergency Medicine

## 2017-04-08 ENCOUNTER — Emergency Department: Payer: BLUE CROSS/BLUE SHIELD

## 2017-04-08 DIAGNOSIS — Z7982 Long term (current) use of aspirin: Secondary | ICD-10-CM | POA: Insufficient documentation

## 2017-04-08 DIAGNOSIS — R319 Hematuria, unspecified: Secondary | ICD-10-CM

## 2017-04-08 DIAGNOSIS — N3001 Acute cystitis with hematuria: Secondary | ICD-10-CM | POA: Diagnosis not present

## 2017-04-08 DIAGNOSIS — Z79899 Other long term (current) drug therapy: Secondary | ICD-10-CM | POA: Diagnosis not present

## 2017-04-08 DIAGNOSIS — R569 Unspecified convulsions: Secondary | ICD-10-CM

## 2017-04-08 DIAGNOSIS — Z87891 Personal history of nicotine dependence: Secondary | ICD-10-CM | POA: Diagnosis not present

## 2017-04-08 DIAGNOSIS — N390 Urinary tract infection, site not specified: Secondary | ICD-10-CM

## 2017-04-08 DIAGNOSIS — N189 Chronic kidney disease, unspecified: Secondary | ICD-10-CM | POA: Insufficient documentation

## 2017-04-08 DIAGNOSIS — N289 Disorder of kidney and ureter, unspecified: Secondary | ICD-10-CM

## 2017-04-08 HISTORY — DX: Unspecified convulsions: R56.9

## 2017-04-08 LAB — CBC WITH DIFFERENTIAL/PLATELET
BASOS PCT: 1 %
Basophils Absolute: 0.1 10*3/uL (ref 0–0.1)
EOS ABS: 0.3 10*3/uL (ref 0–0.7)
Eosinophils Relative: 4 %
HCT: 35.9 % (ref 35.0–47.0)
HEMOGLOBIN: 12.4 g/dL (ref 12.0–16.0)
Lymphocytes Relative: 25 %
Lymphs Abs: 1.9 10*3/uL (ref 1.0–3.6)
MCH: 31.5 pg (ref 26.0–34.0)
MCHC: 34.5 g/dL (ref 32.0–36.0)
MCV: 91.2 fL (ref 80.0–100.0)
MONOS PCT: 8 %
Monocytes Absolute: 0.6 10*3/uL (ref 0.2–0.9)
NEUTROS PCT: 62 %
Neutro Abs: 4.8 10*3/uL (ref 1.4–6.5)
PLATELETS: 312 10*3/uL (ref 150–440)
RBC: 3.94 MIL/uL (ref 3.80–5.20)
RDW: 13.2 % (ref 11.5–14.5)
WBC: 7.8 10*3/uL (ref 3.6–11.0)

## 2017-04-08 LAB — URINALYSIS, COMPLETE (UACMP) WITH MICROSCOPIC
Bilirubin Urine: NEGATIVE
GLUCOSE, UA: NEGATIVE mg/dL
HGB URINE DIPSTICK: NEGATIVE
Ketones, ur: NEGATIVE mg/dL
Leukocytes, UA: NEGATIVE
NITRITE: POSITIVE — AB
PH: 6 (ref 5.0–8.0)
Protein, ur: NEGATIVE mg/dL
RBC / HPF: NONE SEEN RBC/hpf (ref 0–5)
SPECIFIC GRAVITY, URINE: 1.009 (ref 1.005–1.030)

## 2017-04-08 LAB — BASIC METABOLIC PANEL
Anion gap: 5 (ref 5–15)
BUN: 16 mg/dL (ref 6–20)
CALCIUM: 9 mg/dL (ref 8.9–10.3)
CO2: 27 mmol/L (ref 22–32)
CREATININE: 1.43 mg/dL — AB (ref 0.44–1.00)
Chloride: 108 mmol/L (ref 101–111)
GFR calc non Af Amer: 39 mL/min — ABNORMAL LOW (ref >60)
GFR, EST AFRICAN AMERICAN: 45 mL/min — AB (ref >60)
Glucose, Bld: 96 mg/dL (ref 65–99)
Potassium: 3.9 mmol/L (ref 3.5–5.1)
SODIUM: 140 mmol/L (ref 135–145)

## 2017-04-08 LAB — TROPONIN I: Troponin I: <0.03 ng/mL (ref <0.03)

## 2017-04-08 MED ORDER — SODIUM CHLORIDE 0.9 % IV SOLN
1000.0000 mg | Freq: Once | INTRAVENOUS | Status: AC
  Administered 2017-04-08: 1000 mg via INTRAVENOUS
  Filled 2017-04-08: qty 10

## 2017-04-08 MED ORDER — DEXTROSE 5 % IV SOLN
1.0000 g | Freq: Once | INTRAVENOUS | Status: AC
  Administered 2017-04-08: 1 g via INTRAVENOUS
  Filled 2017-04-08: qty 10

## 2017-04-08 MED ORDER — CEPHALEXIN 500 MG PO CAPS: 500.0000 mg | ORAL_CAPSULE | Freq: Two times a day (BID) | ORAL | 0 refills | Status: DC

## 2017-04-08 MED ORDER — LEVETIRACETAM 500 MG PO TABS: 500.0000 mg | ORAL_TABLET | Freq: Two times a day (BID) | ORAL | 0 refills | Status: DC

## 2017-04-08 MED ORDER — LEVETIRACETAM 750 MG PO TABS: 750.0000 mg | ORAL_TABLET | Freq: Two times a day (BID) | ORAL | 0 refills | Status: DC

## 2017-04-08 NOTE — ED Notes (Signed)
Pt transported to CT ?

## 2017-04-08 NOTE — ED Provider Notes (Signed)
[facility] Emergency Department Provider Note ____________________________________________   I have reviewed the triage vital signs and the triage nursing note.  HISTORY  Chief Complaint Seizures   Historian Patient, most history from husband  HPI [name] is a 61 y.o. female with history of SAH and stroke in February of this year, discharge for just about 5 weeks now from [facility], having been on seizure meds in the hospital and discharged without.  No history of prior seizure.  1 week ago had a seizure witnessed by him in the middle of the night, stiff shaking arms and not responsive, lasted less than5 minutes, took about 10 minutes to come back around. She has a follow-up with a neurologist at [facility] in [place] in about a week. She had a second seizure this past week, and then a third today that was witnessed by a bystander who is a Airline pilot and noted to be seizure-like activity that lasted about 3-5 minutes and patient is back to mental status baseline now.  Denies headaches now or any new headaches. Denies fevers or new weakness or numbness.  No injury.    Past Medical History:  Diagnosis Date  . Anxiety   . Arthritis    left hip  . GERD (gastroesophageal reflux disease)   . Hepatitis 1972   hep B  . Morbid obesity ([place])   . PONV (postoperative nausea and vomiting)   . Seizures [facility])     Patient Active Problem List   Diagnosis Date Noted  . SAH (subarachnoid hemorrhage) ([place]) 01/08/2017  . Aneurysm, carotid artery, internal 01/08/2017  . AKI (acute kidney injury) ([place]) 01/08/2017  . Screening for breast cancer 05/21/2015  . Routine general medical examination at a health care facility 05/21/2015  . Special screening for malignant neoplasms, colon 01/13/2013  . Other and unspecified hyperlipidemia 12/19/2012  . Morbid obesity with BMI of 45.0-49.9, adult ([place]) 09/23/2012  . Depression 07/26/2011    Past Surgical  History:  Procedure Laterality Date  . ABDOMINAL HYSTERECTOMY  04/1999  . ARTHROSCOPIC REPAIR ACL     bilateral knees  . BREAST EXCISIONAL BIOPSY Right 2008   NEG  . BREAST EXCISIONAL BIOPSY Left YRS AGO   X2 - NEG  . BREAST LUMPECTOMY  06/1979; 03/1995   left  . CHOLECYSTECTOMY  09/1987  . LAPAROSCOPIC GASTRIC SLEEVE RESECTION  09/30/2012   Procedure: LAPAROSCOPIC GASTRIC SLEEVE RESECTION;  Surgeon: Madilyn Hook, DO;  Location: [facility];  Service: General;  Laterality: N/A;  . TOTAL HIP ARTHROPLASTY  11/13/2012   left  . UPPER GI ENDOSCOPY  09/30/2012   Procedure: UPPER GI ENDOSCOPY;  Surgeon: Madilyn Hook, DO;  Location: [facility];  Service: General;;    Prior to Admission medications   Medication Sig Start Date End Date Taking? Authorizing Provider  acetaminophen (TYLENOL) 500 MG tablet Take 2 tablets (1,000 mg total) by mouth every 6 (six) hours as needed for mild pain or moderate pain. 04/21/15   Betancourt, Aura Fey, NP  aspirin EC 81 MG tablet Take 81 mg by mouth daily.    [provider]  buPROPion (WELLBUTRIN SR) 150 MG 12 hr tablet Take 1 tablet (150 mg total) by mouth 2 (two) times daily. 01/08/17   Burnard Hawthorne, FNP  cephALEXin (KEFLEX) 500 MG capsule Take 1 capsule (500 mg total) by mouth 2 (two) times daily. 04/08/17   Lisa Roca, MD  FLUoxetine (PROZAC) 20 MG capsule Take 1 capsule (20 mg total) by mouth  daily. 01/08/17   Burnard Hawthorne, FNP  levETIRAcetam (KEPPRA) 750 MG tablet Take 1 tablet (750 mg total) by mouth 2 (two) times daily. 04/08/17   Lisa Roca, MD  Multiple Vitamin (MULTIVITAMIN WITH MINERALS) TABS Take 1 tablet by mouth daily.     [provider]  Omega-3 Fatty Acids (FISH OIL) 1200 MG CAPS Take 1 capsule by mouth 2 (two) times daily.    [provider]  pantoprazole (PROTONIX) 40 MG tablet TAKE 1 TABLET DAILY 04/06/16   Jackolyn Confer, MD    No Known Allergies  Family History  Problem Relation Age of Onset  . Hyperlipidemia  Mother   . Obesity Mother   . Pulmonary fibrosis Mother   . Cancer Maternal Aunt        ovarian cancer  . Cancer Father   . Colon cancer Paternal Grandmother   . Esophageal cancer Neg Hx   . Stomach cancer Neg Hx   . Rectal cancer Neg Hx   . Breast cancer Neg Hx     Social History Social History  Substance Use Topics  . Smoking status: Former Smoker    Packs/day: 0.25    Years: 5.00    Types: Cigarettes    Quit date: 07/25/2001  . Smokeless tobacco: Never Used  . Alcohol use 0.0 oz/week     Comment: 2 glasses of wine per week    Review of Systems  Constitutional: Negative for fever. Eyes: Negative for visual changes. ENT: Negative for sore throat. Cardiovascular: Negative for chest pain. Respiratory: Negative for shortness of breath. Gastrointestinal: Negative for abdominal pain, vomiting and diarrhea. Genitourinary: Negative for dysuria. Musculoskeletal: Negative for back pain. Skin: Negative for rash. Neurological: Negative for headache.  Seizure as per hpi.  ____________________________________________   PHYSICAL EXAM:  VITAL SIGNS: ED Triage Vitals [04/08/17 1313]  Enc Vitals Group     BP 128/85     Pulse Rate (!) 111     Resp 17     Temp 98.3 F (36.8 C)     Temp Source Oral     SpO2 96 %     Weight 200 lb (90.7 kg)     Height 5\' 3"  (1.6 m)     Head Circumference      Peak Flow      Pain Score      Pain Loc      Pain Edu?      Excl. in [place]?      Constitutional: Alert and oriented. Well appearing and in no distress. HEENT   Head: Normocephalic and atraumatic.      Eyes: Conjunctivae are normal. Pupils equal and round.       Ears:         Nose: No congestion/rhinnorhea.   Mouth/Throat: Mucous membranes are moist.   Neck: No stridor. Cardiovascular/Chest: Normal rate, regular rhythm.  No murmurs, rubs, or gallops. Respiratory: Normal respiratory effort without tachypnea nor retractions. Breath sounds are clear and equal bilaterally.  No wheezes/rales/rhonchi. Gastrointestinal: Soft. No distention, no guarding, no rebound. Nontender.  Obese.  Genitourinary/rectal:Deferred Musculoskeletal: Nontender with normal range of motion in all extremities. No joint effusions.  No lower extremity tenderness.  No edema. Neurologic:  Normal speech and language. No gross or focal neurologic deficits are appreciated. Skin:  Skin is warm, dry and intact. No rash noted. Psychiatric: Mood and affect are normal. Speech and behavior are normal. Patient exhibits appropriate insight and judgment.   ____________________________________________  LABS (pertinent positives/negatives)  Labs Reviewed  BASIC METABOLIC PANEL - Abnormal; Notable for the following:       Result Value   Creatinine, Ser 1.43 (*)    GFR calc non Af Amer 39 (*)    GFR calc Af Amer 45 (*)    All other components within normal limits  URINALYSIS, COMPLETE (UACMP) WITH MICROSCOPIC - Abnormal; Notable for the following:    Color, Urine YELLOW (*)    APPearance HAZY (*)    Nitrite POSITIVE (*)    Bacteria, UA MANY (*)    Squamous Epithelial / LPF 0-5 (*)    All other components within normal limits  URINE CULTURE  CBC WITH DIFFERENTIAL/PLATELET  TROPONIN I    ____________________________________________    EKG I, Lisa Roca, MD, the attending physician have personally viewed and interpreted all ECGs.  94 bpm. Normal sinus rhythm. Narrow QRS. Nonspecific ST and T-wave ____________________________________________  RADIOLOGY All Xrays were viewed by me. Imaging interpreted by Radiologist.  Ct head:  IMPRESSION: Subacute to old appearing LEFT frontal infarct though new since 12/01/2016.  Resolution of intracranial hemorrhage seen on previous exam.  New RIGHT maxillary sinus disease.  No additional intracranial abnormalities. __________________________________________  PROCEDURES  Procedure(s) performed: None  Critical Care performed:  None  ____________________________________________   ED COURSE / ASSESSMENT AND PLAN  Pertinent labs & imaging results that were available during my care of the patient were reviewed by me and considered in my medical decision making (see chart for details).   Third seizure, not evaluated since development of seizure, I will obtain head CT.  No ongoing symptoms - back to baseline in ED.  Laboratory studies are overall reassuring.  Cr slightly increased, but similar to few months ago -- recommend to follow up with pcp.  Urinalysis came back showing nitrite positive UTI. Culture was sent. Patient was given one dose of Rocephin here. A CK for outpatient management. No evidence for sepsis clinically.  Patient discharged with Keflex.     CONSULTATIONS:   Discussed with neurologist Dr. Velia Meyer, recommends Keppra 1g load and then 750mg  twice daily.  We reviewed weight and gfr.   Patient / Family / Caregiver informed of clinical course, medical decision-making process, and agree with plan.   I discussed return precautions, follow-up instructions, and discharge instructions with patient and/or family.  Discharge Instructions :You were evaluated after a seizure and started on anti-seizure medication Keppra.  No driving until evaluated and released by your neurologist.  Do not operate machinery, swim/bathe without supervision, or climb to heights as a precaution until evaluated and released by your neurologist as well.  Return to the ER for any additional seizure, or any new or worsening confusion, altered mental status, weakness or numbness or any other symptoms concerning to you.  Your kidney function was slightly decreased, similar to a few months ago - but needs to be followed by your primary care doctor.  You were given wound is a 24-hour antibiotic of Rocephin for urinary tract infection in the emergency room stay. Start your antibiotic in the  morning.  ___________________________________________   FINAL CLINICAL IMPRESSION(S) / ED DIAGNOSES   Final diagnoses:  Seizure ([place])  Renal dysfunction  Urinary tract infection with hematuria, site unspecified              Note: This dictation was prepared with Dragon dictation. Any transcriptional errors that result from this process are unintentional    Lisa Roca, MD 04/08/17 [phone]

## 2017-04-08 NOTE — Discharge Instructions (Signed)
You were evaluated after a seizure and started on anti-seizure medication Keppra.  No driving until evaluated and released by your neurologist.  Do not operate machinery, swim/bathe without supervision, or climb to heights as a precaution until evaluated and released by your neurologist as well.  Return to the ER for any additional seizure, or any new or worsening confusion, altered mental status, weakness or numbness or any other symptoms concerning to you.  Your kidney function was slightly decreased, similar to a few months ago - but needs to be followed by your primary care doctor.  You were given wound is a 24-hour antibiotic of Rocephin for urinary tract infection in the emergency room stay. Start your antibiotic in the morning.

## 2017-04-08 NOTE — ED Triage Notes (Signed)
Pt comes into the ED via EMS from [employer] with her husband who states while he was in the restroom, a IT trainer witnessed pt having full body seizure lasting about 4-45min. Pt is a/o on arrival. States just started having seizures in the past 3 weeks, 2 in the past 7 days. Has an apt with neuro tomorrow morning.. Is not on any seizure medications .

## 2017-04-09 ENCOUNTER — Ambulatory Visit (INDEPENDENT_AMBULATORY_CARE_PROVIDER_SITE_OTHER): Payer: BLUE CROSS/BLUE SHIELD | Admitting: Neurology

## 2017-04-09 ENCOUNTER — Encounter: Payer: Self-pay | Admitting: Neurology

## 2017-04-09 DIAGNOSIS — R569 Unspecified convulsions: Secondary | ICD-10-CM

## 2017-04-09 MED ORDER — FLUOXETINE HCL 10 MG PO CAPS: 10.0000 mg | ORAL_CAPSULE | Freq: Every day | ORAL | 1 refills | Status: DC

## 2017-04-09 NOTE — Progress Notes (Signed)
Reason for visit: Seizures  Referring physician: [place]  [name] is a 61 y.o. female  History of present illness:  [name] is a 84 year old right-handed white female with a history of a spontaneous subarachnoid hemorrhage associated with vasospasm and a subsequent left frontal stroke that occurred on 12/01/2016. The patient has had and an abulia syndrome, around 02/27/2017, the patient was noted to have a seizure-type event that occurred out of sleep. The patient has had 2 more such events, one that was witnessed by the husband around June 25 while working in the front yard, and the patient had another seizure event at [employer] yesterday. The patient was noted to have a staring event, then her right arm extended, the left hand was clenched the patient into a generalized seizure lasting 5 minutes. The patient went to the emergency room, a CT scan of the brain was done that did not show any acute changes. The patient was placed on Keppra taking 750 mg twice daily. The patient comes to this office for further evaluation.   Since the initial stroke, the patient had a reduction in food and fluid intake, and about a 35 pound weight loss. The patient had returned to work prior to the seizures.  Past Medical History:  Diagnosis Date  . Anxiety   . Arthritis    left hip  . GERD (gastroesophageal reflux disease)   . Hepatitis 1972   hep B  . Morbid obesity ([place])   . PONV (postoperative nausea and vomiting)   . Seizures ([place])     Past Surgical History:  Procedure Laterality Date  . ABDOMINAL HYSTERECTOMY  04/1999  . ARTHROSCOPIC REPAIR ACL     bilateral knees  . BREAST EXCISIONAL BIOPSY Right 2008   NEG  . BREAST EXCISIONAL BIOPSY Left YRS AGO   X2 - NEG  . BREAST LUMPECTOMY  06/1979; 03/1995   left  . CHOLECYSTECTOMY  09/1987  . LAPAROSCOPIC GASTRIC SLEEVE RESECTION  09/30/2012   Procedure: LAPAROSCOPIC GASTRIC SLEEVE RESECTION;  Surgeon: Madilyn Hook, DO;   Location: [facility];  Service: General;  Laterality: N/A;  . TOTAL HIP ARTHROPLASTY  11/13/2012   left  . UPPER GI ENDOSCOPY  09/30/2012   Procedure: UPPER GI ENDOSCOPY;  Surgeon: Madilyn Hook, DO;  Location: [facility];  Service: General;;    Family History  Problem Relation Age of Onset  . Hyperlipidemia Mother   . Obesity Mother   . Pulmonary fibrosis Mother   . Cancer Maternal Aunt        ovarian cancer  . Cancer Father   . Colon cancer Paternal Grandmother   . Esophageal cancer Neg Hx   . Stomach cancer Neg Hx   . Rectal cancer Neg Hx   . Breast cancer Neg Hx     Social history:  reports that she quit smoking about 15 years ago. Her smoking use included Cigarettes. She has a 1.25 pack-year smoking history. She has never used smokeless tobacco. She reports that she drinks alcohol. She reports that she does not use drugs.  Medications:  Prior to Admission medications   Medication Sig Start Date End Date Taking? Authorizing Provider  acetaminophen (TYLENOL) 500 MG tablet Take 2 tablets (1,000 mg total) by mouth every 6 (six) hours as needed for mild pain or moderate pain. 04/21/15  Yes Betancourt, Aura Fey, NP  aspirin EC 81 MG tablet Take 81 mg by mouth daily.   Yes [provider]  buPROPion (  WELLBUTRIN SR) 150 MG 12 hr tablet Take 1 tablet (150 mg total) by mouth 2 (two) times daily. 01/08/17  Yes Arnett, Yvetta Coder, FNP  cephALEXin (KEFLEX) 500 MG capsule Take 1 capsule (500 mg total) by mouth 2 (two) times daily. 04/08/17  Yes Lisa Roca, MD  FLUoxetine (PROZAC) 20 MG capsule Take 1 capsule (20 mg total) by mouth daily. 01/08/17  Yes Burnard Hawthorne, FNP  levETIRAcetam (KEPPRA) 750 MG tablet Take 1 tablet (750 mg total) by mouth 2 (two) times daily. 04/08/17  Yes Lisa Roca, MD  Multiple Vitamin (MULTIVITAMIN WITH MINERALS) TABS Take 1 tablet by mouth daily.    Yes [provider]  Omega-3 Fatty Acids (FISH OIL) 1200 MG CAPS Take 1 capsule by mouth 2 (two) times daily.    Yes [provider]  pantoprazole (PROTONIX) 40 MG tablet TAKE 1 TABLET DAILY 04/06/16  Yes Jackolyn Confer, MD     No Known Allergies  ROS:  Out of a complete 14 system review of symptoms, the patient complains only of the following symptoms, and all other reviewed systems are negative.  Seizure Decreased appetite, weight loss  Blood pressure 118/74, pulse 85, height 5\' 2"  (1.575 m), weight 236 lb (107 kg).  Physical Exam  General: The patient is alert and cooperative at the time of the examination. The patient is moderately to markedly obese.  Eyes: Pupils are equal, round, and reactive to light. Discs are flat bilaterally.  Neck: The neck is supple, no carotid bruits are noted.  Respiratory: The respiratory examination is clear.  Cardiovascular: The cardiovascular examination reveals a regular rate and rhythm, no obvious murmurs or rubs are noted.  Skin: Extremities are without significant edema.  Neurologic Exam  Mental status: The patient is alert and oriented x 3 at the time of the examination. The patient has apparent normal recent and remote memory, with an apparently normal attention span and concentration ability.  Cranial nerves: Facial symmetry is present. There is good sensation of the face to pinprick and soft touch bilaterally. The strength of the facial muscles and the muscles to head turning and shoulder shrug are normal bilaterally. Speech is well enunciated, no aphasia or dysarthria is noted. Extraocular movements are full. Visual fields are full. The tongue is midline, and the patient has symmetric elevation of the soft palate. No obvious hearing deficits are noted. The patient does have a flat affect.  Motor: The motor testing reveals 5 over 5 strength of all 4 extremities. Good symmetric motor tone is noted throughout.  Sensory: Sensory testing is intact to pinprick, soft touch, vibration sensation, and position sense on all 4 extremities. No  evidence of extinction is noted.  Coordination: Cerebellar testing reveals good finger-nose-finger and heel-to-shin bilaterally.  Gait and station: Gait is normal. Tandem gait is normal. Romberg is negative. No drift is seen.  Reflexes: Deep tendon reflexes are symmetric and normal bilaterally. Toes are downgoing bilaterally.   CT head 04/08/17:  IMPRESSION: Subacute to old appearing LEFT frontal infarct though new since 12/01/2016.  Resolution of intracranial hemorrhage seen on previous exam.  New RIGHT maxillary sinus disease.  No additional intracranial abnormalities.  * CT scan images were reviewed online. I agree with the written report.    Assessment/Plan:  1. Spontaneous subarachnoid hemorrhage, left frontal stroke  2. Abulia syndrome following stroke  3. Seizures  The patient is on Keppra, she will remain on her current dose of 750 mg twice daily if this is causing  too much drowsiness, they will contact our office. The patient is not to operate a motor vehicle for at least 6 months following the last seizure. The patient will come off of Wellbutrin, the Prozac will be increased to 30 mg daily. A prescription was sent in for the 10 mg capsule. She will follow-up in 3 months. We may eventually increase the Prozac to 40 mg daily. This may also help to boost her appetite.  An EEG study will be ordered.  Jill Alexanders MD 04/09/2017 7:32 AM  [facility] [address] [place], [place] [address]  Phone [phone] Fax [phone]

## 2017-04-09 NOTE — Patient Instructions (Signed)
   Stop the Wellbutrin and we will go up on the prozac to 30 mg daily.  We will get an EEG.

## 2017-04-10 ENCOUNTER — Telehealth: Payer: Self-pay | Admitting: Neurology

## 2017-04-10 NOTE — Telephone Encounter (Signed)
Spoke to patient - says she was placed on this medication on 04/08/17 at the ED.  She is going to take it for a few weeks to make sure she can tolerate it.  If no problems, she would like it refilled for 90 days.  I told her to the pharmacy could fax [place] a 90-day request when it is time for her refills.  She verbalized understanding.

## 2017-04-10 NOTE — Telephone Encounter (Signed)
Pt calling re: levETIRAcetam (KEPPRA) 750 MG tablet, pt said it is not enough for a 3 month supply only 30.  She was told it would be for a 90 day supply please call.

## 2017-04-11 LAB — URINE CULTURE: Culture: >=100000 — AB

## 2017-04-26 ENCOUNTER — Telehealth: Payer: Self-pay | Admitting: Neurology

## 2017-04-26 ENCOUNTER — Ambulatory Visit (INDEPENDENT_AMBULATORY_CARE_PROVIDER_SITE_OTHER): Payer: BLUE CROSS/BLUE SHIELD | Admitting: Neurology

## 2017-04-26 DIAGNOSIS — R569 Unspecified convulsions: Secondary | ICD-10-CM

## 2017-04-26 NOTE — Telephone Encounter (Signed)
I called the patient. The EEG study shows left frontal slowing that is more prominent during hyperventilation, likely related to the prior stroke event.  No epileptiform discharges were seen, the patient likely has seizures related to the old stroke, the patient will remain on Keppra.

## 2017-04-26 NOTE — Procedures (Signed)
     History: [name] is a 61 year old patient with a history of a spontaneous subarachnoid hemorrhage that occurred on the 23rd of February, 2018. The patient has had a subsequent left frontal stroke with an abulia syndrome. The patient has had 3 seizure-type events coming out of sleep, the first event occurred on 02/27/2017. The patient is being evaluated for this issue.  This is a routine EEG. No skull defects are noted. Medications include Tylenol, aspirin, Wellbutrin, Prozac, Keppra, multivitamins, fish oil, and Protonix.  EEG classification: Delta grade 1 left frontal  Description of the recording: The background rhythms of this recording consists of a fairly well modulated medium amplitude alpha rhythm of 8 Hz that is reactive to eye opening and closure. As the record progresses, the patient appears to remain in the waking state throughout the recording. During hyperventilation, a 2 Hz frontal delta activity is seen, and becomes less prominent after hyperventilation but is still present to some degree. Photic stimulation is performed, this results in a bilateral and symmetric photic response. At no time during the recording does there appear to be evidence of spike or spike-wave discharges. EKG monitor shows no evidence of cardiac rhythm abnormalities with a heart rate of 78.  Impression: This is an abnormal EEG recording secondary to left frontal slowing. This abnormality may be associated with the area of injury associated with the prior stroke, no definite epileptiform activity was seen during this recording.

## 2017-04-27 MED ORDER — LEVETIRACETAM 750 MG PO TABS: 750.0000 mg | ORAL_TABLET | Freq: Two times a day (BID) | ORAL | 3 refills | Status: DC

## 2017-04-27 NOTE — Telephone Encounter (Signed)
Patient called office returning Dr. Willis' call.  Please call °

## 2017-04-27 NOTE — Telephone Encounter (Signed)
I called the patient. The EEG showed slowing around the area of the prior stroke.  The patient will remain on Keppra, a prescription was called in. She will let me know if another seizure occurs.

## 2017-04-27 NOTE — Addendum Note (Signed)
Addended by: Kathrynn Ducking on: 04/27/2017 01:27 PM   Modules accepted: Orders

## 2017-04-30 ENCOUNTER — Telehealth: Payer: Self-pay | Admitting: *Deleted

## 2017-04-30 NOTE — Telephone Encounter (Signed)
Faxed printed/signed rx keppra to pt pharmacy. Fax: [phone]. Received confirmation.

## 2017-05-01 ENCOUNTER — Encounter (HOSPITAL_COMMUNITY): Payer: Self-pay

## 2017-05-02 ENCOUNTER — Telehealth: Payer: Self-pay | Admitting: Neurology

## 2017-05-02 NOTE — Telephone Encounter (Signed)
Error

## 2017-05-04 ENCOUNTER — Encounter: Payer: Self-pay | Admitting: *Deleted

## 2017-05-04 NOTE — Progress Notes (Signed)
[facility], 4 refills. Received confirmation.

## 2017-05-07 ENCOUNTER — Telehealth: Payer: Self-pay | Admitting: *Deleted

## 2017-05-07 NOTE — Telephone Encounter (Signed)
Received forms to be filled out from patient for her work. Paperwork already completed. CW,MD requested more clarification.  I called patient. She stated this was what was expected of her at her current job (job description). She stated Dr Jannifer Franklin needs to look over it and if he disagrees with anything, he can cross it out and put what he feels and initial. Advised I will give back to CW,MD to review. She verbalized understanding.

## 2017-05-08 NOTE — Telephone Encounter (Signed)
Pt stopped in office yesterday to make some clarification re: forms.  Gave to CW,MD again to review and complete.  Gave completed forms back to medical records to process for patient.

## 2017-05-09 ENCOUNTER — Telehealth: Payer: Self-pay | Admitting: Neurology

## 2017-05-09 ENCOUNTER — Encounter: Payer: Self-pay | Admitting: Neurology

## 2017-05-09 ENCOUNTER — Encounter: Payer: Self-pay | Admitting: Psychology

## 2017-05-09 DIAGNOSIS — R413 Other amnesia: Secondary | ICD-10-CM

## 2017-05-09 DIAGNOSIS — Z0289 Encounter for other administrative examinations: Secondary | ICD-10-CM

## 2017-05-09 NOTE — Addendum Note (Signed)
Addended by: Kathrynn Ducking on: 05/09/2017 01:38 PM   Modules accepted: Orders

## 2017-05-09 NOTE — Telephone Encounter (Signed)
Patient calling to discuss short term disability which may lead to long term disability.

## 2017-05-09 NOTE — Telephone Encounter (Signed)
I called patient. The patient is considering short and long-term disability. She claims that most of her deficits are cognitive in nature, I have no real documentation of this in any of my evaluations.  I have suggested getting a neuro psychological evaluation, the patient is amenable to this.

## 2017-05-10 ENCOUNTER — Encounter: Payer: Self-pay | Admitting: Neurology

## 2017-05-11 ENCOUNTER — Encounter: Payer: Self-pay | Admitting: Neurology

## 2017-05-14 ENCOUNTER — Telehealth: Payer: Self-pay | Admitting: *Deleted

## 2017-05-14 NOTE — Telephone Encounter (Signed)
Patient called back. She clarified dates should be from 05/02/17-06/02/17. Made changes on FMLA. Gave to medical records to process for patient.

## 2017-05-14 NOTE — Telephone Encounter (Signed)
Called and LVM for pt to call and clarify start/end date of incapacity. She put June 02, 2017 on her form. Needed for information. Asked her to call back.

## 2017-05-16 DIAGNOSIS — Z0289 Encounter for other administrative examinations: Secondary | ICD-10-CM

## 2017-05-23 ENCOUNTER — Telehealth: Payer: Self-pay | Admitting: *Deleted

## 2017-05-23 NOTE — Telephone Encounter (Signed)
Spoke with patient she has been advised paperwork for [employer] has been completed and will be faxed to [phone], and a copy will be mailed to her per her request.  She was sent to medical records to process the $50 fee.

## 2017-05-23 NOTE — Telephone Encounter (Signed)
Gave completed STD paperwork from [employer] back to medical records to process for patient.

## 2017-05-24 DIAGNOSIS — Z0289 Encounter for other administrative examinations: Secondary | ICD-10-CM

## 2017-06-25 NOTE — Telephone Encounter (Addendum)
Spoke with [name]. Patient does not have appt for neuropsychological evaluation until November.   Per Dr Jannifer Franklin, patient needs neuropsychological testing before we can do LTD paperwork. We can fill out STD paperwork for her. She does not need an appt.

## 2017-06-25 NOTE — Telephone Encounter (Signed)
Pt called to move 10/2 appt sooner. Pt will need to see Dr Viona Gilmore bc it is STD.  Please call her at [phone]

## 2017-06-25 NOTE — Telephone Encounter (Signed)
We will be able to fill out the short-term disability papers for her, we need neuropsychological evaluation prior to filling out forms for long-term disability.

## 2017-06-26 NOTE — Telephone Encounter (Signed)
Called and spoke with patient. Relayed Dr Jannifer Franklin will fill out STD for her, no need for appt with him. Also advised she needs to have neuropsychological testing done first before consideration of LTD paperwork per CW,MD.  She will drop off paperwork either today or tomorrow. Advised patient that she needs to fill out record release form for [place] to be able to send paperwork to employer if needed. She verbalized understanding.

## 2017-06-29 ENCOUNTER — Other Ambulatory Visit: Payer: Self-pay

## 2017-06-29 DIAGNOSIS — F3342 Major depressive disorder, recurrent, in full remission: Secondary | ICD-10-CM

## 2017-06-29 MED ORDER — FLUOXETINE HCL 20 MG PO CAPS: 20.0000 mg | ORAL_CAPSULE | Freq: Every day | ORAL | 1 refills | Status: DC

## 2017-07-02 NOTE — Telephone Encounter (Signed)
Called pt. Advised I had not received any STD paperwork to be filled out yet. She stated no paperwork needs to be completed. She stated Amy Kimberlee Nearing should be calling next week from the [facility] for an update from Dr Jannifer Franklin. I advised we did receive copy of old forms she dropped off. I will hang onto copy. She verbalized understanding and appreciation for call.

## 2017-07-05 ENCOUNTER — Telehealth: Payer: Self-pay | Admitting: Family

## 2017-07-05 DIAGNOSIS — F3342 Major depressive disorder, recurrent, in full remission: Secondary | ICD-10-CM

## 2017-07-05 NOTE — Telephone Encounter (Signed)
Call pt-  Confirm dose of prozac-   Is she on 20mg  or 10mg ?

## 2017-07-05 NOTE — Telephone Encounter (Signed)
Pt called back returning your call. Please advise, thank you!  Call pt @ [phone]

## 2017-07-05 NOTE — Telephone Encounter (Signed)
Patient is on 20mg

## 2017-07-05 NOTE — Telephone Encounter (Signed)
Patient has been taking 30mg .

## 2017-07-05 NOTE — Telephone Encounter (Signed)
[facility] have been [facility] of medication.

## 2017-07-05 NOTE — Telephone Encounter (Signed)
Left message for patient to return call back.  

## 2017-07-05 NOTE — Telephone Encounter (Addendum)
[facility] pharmacist called stating that the patient was prescribed Prozac 20 mg by Arnett and Prozac 10 mg from another provider. They want to know if the patient has stopped taking the Prozac 10 mg. Ref # [id number]

## 2017-07-10 ENCOUNTER — Ambulatory Visit: Payer: BLUE CROSS/BLUE SHIELD | Admitting: Adult Health

## 2017-07-13 NOTE — Telephone Encounter (Signed)
Patient advised  Return to work release has been completed and faxed to April Wyatt @ [phone].  Per patient request forms have been mailed to her also. No charge for forms.

## 2017-08-09 ENCOUNTER — Telehealth: Payer: Self-pay

## 2017-08-09 MED ORDER — CLINDAMYCIN HCL 150 MG PO CAPS: 150.0000 mg | ORAL_CAPSULE | Freq: Three times a day (TID) | ORAL | 0 refills | Status: DC

## 2017-08-09 NOTE — Telephone Encounter (Signed)
Patient was informed by dentist to have PCP send in the medication prior to surgery.  Provider stated it was ok to give medication.

## 2017-08-14 ENCOUNTER — Telehealth: Payer: Self-pay | Admitting: Neurology

## 2017-08-14 NOTE — Telephone Encounter (Signed)
Patient is calling to find out what medication she is taking for a stroke. She is in [place] at a dentist office and they need to know as soon as possible.

## 2017-08-14 NOTE — Telephone Encounter (Signed)
Called and LVM for pt advising on med list, we have that she is taking ASA 81mg  daily. No other meds that I see (blood thinner/cholesterol meds) on her med list as of last visit. Gave [facility] phone number if she has further questions/concerns.

## 2017-08-17 DIAGNOSIS — Z0289 Encounter for other administrative examinations: Secondary | ICD-10-CM

## 2017-08-28 ENCOUNTER — Encounter: Payer: Self-pay | Admitting: Psychology

## 2017-08-28 ENCOUNTER — Telehealth: Payer: Self-pay | Admitting: *Deleted

## 2017-08-28 ENCOUNTER — Ambulatory Visit (INDEPENDENT_AMBULATORY_CARE_PROVIDER_SITE_OTHER): Payer: BLUE CROSS/BLUE SHIELD | Admitting: Psychology

## 2017-08-28 DIAGNOSIS — I609 Nontraumatic subarachnoid hemorrhage, unspecified: Secondary | ICD-10-CM

## 2017-08-28 DIAGNOSIS — I6990 Unspecified sequelae of unspecified cerebrovascular disease: Secondary | ICD-10-CM

## 2017-08-28 DIAGNOSIS — I69011 Memory deficit following nontraumatic subarachnoid hemorrhage: Secondary | ICD-10-CM | POA: Diagnosis not present

## 2017-08-28 DIAGNOSIS — R569 Unspecified convulsions: Secondary | ICD-10-CM

## 2017-08-28 DIAGNOSIS — R413 Other amnesia: Secondary | ICD-10-CM

## 2017-08-28 NOTE — Telephone Encounter (Signed)
Received fax request from [employer] to send all medical records for the period from 04/08/17 to present. Faxed records as requested with signed release form by patient to [phone]. Received fax confirmation.

## 2017-08-28 NOTE — Progress Notes (Signed)
NEUROPSYCHOLOGICAL INTERVIEW (CPT: [id number])  Name: [name] Date of Birth: 21-Oct-1955 Date of Interview: 08/28/2017  Reason for Referral:  [name] is a 61 y.o. right handed female who is referred for neuropsychological evaluation by Dr. Kathrynn Ducking of [facility] due to concerns about cognitive changes s/p subarachnoid hemorrhage and subsequent left frontal stroke and seizures. This patient is accompanied in the office by her husband, Rush Landmark, who supplements the history.  History of Presenting Problem:  [name] was in her usual state of health when she experienced a severe headache on 12/01/2016 and presented to [facility] where brain imaging demonstrated a subarachnoid hemorrhage. She was stabilized and transferred to [facility] neurointensive care unit on 12/02/2016 for further evaluation and treatment. Per records from [place], on 12/02/2016 she underwent a diagnostic cerebral angiogram which revealed a small extradural left cavernous segment aneurysm but no evidence of an intradural aneurysm or arteriovenous malformation or arterio-venous fistual that would explain the subarachnoid hemorrhage. A post-angio CT brain scan was obtained which reportedly demonstrated unchanged subarachnoid hemorrhage with interval increase in hydrocephalus. On 12/05/2016, within 10 minutes of completing a repeat diagnostic cerebral angiogram, [name] was noted to have a change in mental status with loss of mobility of her right upper and lower extremities, right sided clonus and disorientation to time and place. A stat CT brain scan was obtained and reportedly demonstrated stable findings from prior. ReoPro and aspirin were administered which slowly improved her neuro examination. On 12/06/2016 a repeat CT brain was obtained and demonstrated a left frontal infarct with stable ventricular size. She was noted to have waxing and waning speech impairment and continued to  work with PT, OT and ST. On 3/1, she was neurologically stable and deemed safe for transfer out of ICU, but unfortunately the next day she had altered mental status and a CT/CTA demonstrated severe vasospasm involving the left M1, MS, M3 and bilateral A1 and A2 segments. EEG was started on 12/09/2016 given concerns that her neuro exam may be related to seizure activity. It was discontinued the next day as it was negative for seizures. On 12/22/2016, she was transferred to inpatient rehabilitation. She was there for a week and was ultimately discharged home on 12/29/2016. Subsequently she participated in outpatient PT and ST. Her physician advised that she could return to work and she did so. However, she noticed difficulty performing tasks at work and as a result was very frustrated with low self confidence. Around 02/27/2017, the patient's husband observed her have a seizure-type event in her sleep. The seizure ended before EMS arrived, and when they arrived the patient had returned to baseline. They recommended the patient's husband take her to the ED if he wanted. He did, and they were advised there was a long wait and it might be better for them to go to Urgent Care the next day. She had two more seizure-type events later in the summer, within a week of each other. She went to the ED and CT showed no acute changes. She was put on Keppra. She ultimately decided to stop working as she was having difficulty performing tasks due to cognitive changes. She had one more seizure later in the summer when she smoked a cigarette (she was on Keppra at the time). EEG was completed on 04/26/2017 which was read as abnormal secondary to left frontal slowing. It was noted this abnormality may be associated with the area of injury associated with the prior stroke.  There was no definite epileptiform activity seen during the recording.  Dr. Jannifer Franklin' notes indicate the patient demonstrated abulia syndrome status-post stroke, and Ms.  [name]'s husband's report of her post-stroke functioning/behavior certainly supports this. 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 Since she stopped working, her husband noticed she is more subdued, less talkative, more passive. He reported she does not initiate conversation anymore. She states that overall she feels "less astute". Her husband agrees and states she used to be very logical and anticipate things very well, but does not any longer. She does not want to make decisions, she leaves them to her husband. She also continues to have word finding difficulty and word substitutions/paraphasic errors. She has more trouble remembering overlearned information such as common passwords and has to look them up on a daily basis. She used to do a crossword puzzle every day but now it takes her much longer and sometimes she can't even finish them. She has left ingredients out when cooking dishes on at least two occasions. She manages her personal checkbook and notes she does not stay on top of it like she used to.  Upon direct questioning, the patient and her husband reported the following with regard to current cognitive functioning:  Forgetting recent conversations/events: Yes Repeating statements/questions: No, her husband has only noticed this on one occasion Misplacing/losing items: No Forgetting appointments or other obligations: No, she writes everything down and stays on top of them Forgetting to take medications: No, "I'm OCD about that", she is able to put in daily planners every 2 weeks and administer daily Difficulty concentrating: Yes Starting but not finishing tasks: Yes Distracted easily: Yes Processing information more slowly: Yes Writing difficulty: Handwriting is sometimes perfectly fine, sometimes horrible, don't know why. Spelling difficulty:  No Comprehension difficulty: Sometimes, she has to ask people to repeat what they have said Getting lost when driving: No. (She hasn't driven since July, except for on one occasion in October)  There is no evidence of anosognosia; she has awareness in to her deficits. Her husband states, "she knows what she doesn't know".  She has been sleeping much more than she did prior to the stroke. She also gets fatigued some during the day and occasionally has to take a nap. She sleeps soundly and snores per her husband. She has never had a sleep study, never been diagnosed with sleep apnea. Her appetite has been significantly reduced since the stroke. She lost 35 lbs after the stroke. She is eating better now but still only about 1/3 of a meal. Physically, she states she feels "pretty good". She has not had any falls, aside from in the context of seizures. Her husband notices she walks/moves slower than she used to.   Her mood is much more even keel since the stroke. She has not had "up moods" or "down moods". She has not been experiencing any significant anxiety. Her husband perceives her as very calm. He reports some mild changes in interpersonal functioning and mannerisms, noting that she phrases things differently than she used to, perhaps more formally. No inappropriate behaviors were reported. She has not had any visual illusions or hallucinations.  Psychiatric history is reportedly positive for anxiety in the past. She was on Prozac and Wellbutrin in the past; Wellbutrin was discontinued but she continues to take Prozac. She has never seen a mental health provider;  medications were prescribed by her PCP. She denies past or present suicidal ideation or intention.   There is no family history of stroke or cerebral aneurysm.    Social History: Born/Raised: Born in [place], but moved a lot Education: Dietitian from [place] history: Worked for [employer], as Civil engineer, contracting and VP of sales, did a lot of traveling.  5 years ago, after her mother passed away, she wanted to do different type of work, and she started working at [employer]. She is seeking disability now.  Marital history: Married to [employer] since 1998. Rush Landmark has 2 daughters, they live in [place]. She has no biological children.  Alcohol: One drink occasionally Tobacco: Former daily smoker (1/3 ppd), quit in 1996. She has a "very occasional cigarette", for example when brother came for the summer, she had one.   Medical History: Past Medical History:  Diagnosis Date  . Anxiety   . Arthritis    left hip  . GERD (gastroesophageal reflux disease)   . Hepatitis 1972   hep B  . Morbid obesity ([place])   . PONV (postoperative nausea and vomiting)   . Seizures ([place])      Current Medications:  Outpatient Encounter Medications as of 08/28/2017  Medication Sig  . acetaminophen (TYLENOL) 500 MG tablet Take 2 tablets (1,000 mg total) by mouth every 6 (six) hours as needed for mild pain or moderate pain.  Marland Kitchen aspirin EC 81 MG tablet Take 81 mg by mouth daily.  . cephALEXin (KEFLEX) 500 MG capsule Take 1 capsule (500 mg total) by mouth 2 (two) times daily.  . clindamycin (CLEOCIN) 150 MG capsule Take 1 capsule (150 mg total) by mouth 3 (three) times daily.  Marland Kitchen FLUoxetine (PROZAC) 10 MG capsule Take 10 mg by mouth daily.  Marland Kitchen FLUoxetine (PROZAC) 20 MG capsule Take 1 capsule (20 mg total) by mouth daily.  Marland Kitchen levETIRAcetam (KEPPRA) 750 MG tablet Take 1 tablet (750 mg total) by mouth 2 (two) times daily.  . Multiple Vitamin (MULTIVITAMIN WITH MINERALS) TABS Take 1 tablet by mouth daily.   . Omega-3 Fatty Acids (FISH OIL) 1200 MG CAPS Take 1 capsule by mouth 2 (two) times daily.  . pantoprazole (PROTONIX) 40 MG tablet TAKE 1 TABLET DAILY   No facility-administered encounter medications on file as of 08/28/2017.      Behavioral Observations:   Appearance: Appropriately dressed and groomed Gait: Ambulated  independently, no gross abnormalities observed Speech: Fluent; normal rate, rhythm and volume. Mild word finding difficulty observed during conversational speech. Thought process: Generally linear, goal directed Affect: Mildly blunted, mildly anxious Interpersonal: Pleasant, appropriate, engaged   TESTING: There is medical necessity to proceed with neuropsychological assessment as the results will be used to aid in differential diagnosis and clinical decision-making and to inform specific treatment recommendations. Per the patient, her husband and medical records reviewed, there has been a change in cognitive functioning and a reasonable suspicion of neurocognitive disorder due to CVA.   PLAN: The patient will return next week for a full battery of neuropsychological testing with a psychometrician under my supervision. Education regarding testing procedures was provided. Subsequently, the patient will see this provider for a follow-up session at which time her test performances and my impressions and treatment recommendations will be reviewed in detail.  Full neuropsychological evaluation report to follow.

## 2017-09-04 ENCOUNTER — Encounter: Payer: Self-pay | Admitting: Psychology

## 2017-09-04 ENCOUNTER — Ambulatory Visit (INDEPENDENT_AMBULATORY_CARE_PROVIDER_SITE_OTHER): Payer: BLUE CROSS/BLUE SHIELD | Admitting: Psychology

## 2017-09-04 DIAGNOSIS — R569 Unspecified convulsions: Secondary | ICD-10-CM

## 2017-09-04 DIAGNOSIS — I6990 Unspecified sequelae of unspecified cerebrovascular disease: Secondary | ICD-10-CM

## 2017-09-04 DIAGNOSIS — I609 Nontraumatic subarachnoid hemorrhage, unspecified: Secondary | ICD-10-CM

## 2017-09-04 DIAGNOSIS — R413 Other amnesia: Secondary | ICD-10-CM

## 2017-09-04 NOTE — Progress Notes (Signed)
   Neuropsychology Note  [name] returned today for 2 hours of neuropsychological testing with technician, [name], BS, under the supervision of Dr. Macarthur Critchley. The patient did not appear overtly distressed by the testing session, per behavioral observation or via self-report to the technician. Rest breaks were offered. [name] will return within 2 weeks for a feedback session with Dr. Si Raider at which time her test performances, clinical impressions and treatment recommendations will be reviewed in detail. The patient understands she can contact our office should she require our assistance before this time.  Full report to follow.

## 2017-09-10 ENCOUNTER — Telehealth: Payer: Self-pay | Admitting: Family

## 2017-09-10 NOTE — Telephone Encounter (Signed)
Request appointment for UTI. Appt made.

## 2017-09-11 ENCOUNTER — Encounter: Payer: Self-pay | Admitting: Internal Medicine

## 2017-09-11 ENCOUNTER — Ambulatory Visit (INDEPENDENT_AMBULATORY_CARE_PROVIDER_SITE_OTHER): Payer: BLUE CROSS/BLUE SHIELD | Admitting: Internal Medicine

## 2017-09-11 VITALS — BP 122/82 | HR 70 | Temp 98.4°F | Wt 243.0 lb

## 2017-09-11 DIAGNOSIS — R3989 Other symptoms and signs involving the genitourinary system: Secondary | ICD-10-CM | POA: Diagnosis not present

## 2017-09-11 DIAGNOSIS — R300 Dysuria: Secondary | ICD-10-CM

## 2017-09-11 LAB — POC URINALSYSI DIPSTICK (AUTOMATED)
Blood, UA: NEGATIVE
Nitrite, UA: POSITIVE
PH UA: 5 (ref 5.0–8.0)
SPEC GRAV UA: 1.025 (ref 1.010–1.025)
Urobilinogen, UA: 2 E.U./dL — AB

## 2017-09-11 MED ORDER — CEPHALEXIN 500 MG PO CAPS: 500.0000 mg | ORAL_CAPSULE | Freq: Two times a day (BID) | ORAL | 0 refills | Status: DC

## 2017-09-11 NOTE — Patient Instructions (Signed)

## 2017-09-11 NOTE — Progress Notes (Signed)
HPI  Pt presents to the clinic today with c/o bladder pressure and discomfort. She reports this started 5 days ago. She has some associated dysuria, but denies urgency, frequency or blood in her urine. She denies fever, chills, nausea or low back pain. She has tried AZO with minimal relief.   Review of Systems  Past Medical History:  Diagnosis Date  . Anxiety   . Arthritis    left hip  . GERD (gastroesophageal reflux disease)   . Hepatitis 1972   hep B  . Morbid obesity ([place])   . PONV (postoperative nausea and vomiting)   . Seizures ([place])     Family History  Problem Relation Age of Onset  . Hyperlipidemia Mother   . Obesity Mother   . Pulmonary fibrosis Mother   . Cancer Maternal Aunt        ovarian cancer  . Cancer Father   . Colon cancer Paternal Grandmother   . Esophageal cancer Neg Hx   . Stomach cancer Neg Hx   . Rectal cancer Neg Hx   . Breast cancer Neg Hx     Social History   Socioeconomic History  . Marital status: Married    Spouse name: Not on file  . Number of children: 0  . Years of education: BS  . Highest education level: Not on file  Social Needs  . Financial resource strain: Not on file  . Food insecurity - worry: Not on file  . Food insecurity - inability: Not on file  . Transportation needs - medical: Not on file  . Transportation needs - non-medical: Not on file  Occupational History  . Not on file  Tobacco Use  . Smoking status: Former Smoker    Packs/day: 0.25    Years: 5.00    Pack years: 1.25    Types: Cigarettes    Last attempt to quit: 07/25/2001    Years since quitting: 16.1  . Smokeless tobacco: Never Used  Substance and Sexual Activity  . Alcohol use: Yes    Alcohol/week: 0.0 oz    Comment: 2 glasses of wine per week  . Drug use: No  . Sexual activity: Not on file  Other Topics Concern  . Not on file  Social History Narrative   Lives with spouse   Caffeine use: Drinks coffee daily   Right handed    No Known  Allergies   Constitutional: Denies fever, malaise, fatigue, headache or abrupt weight changes.   GU: Pt reports bladder pressure and pain with urination. Denies urgency, frequency, burning sensation, blood in urine, odor or discharge.   No other specific complaints in a complete review of systems (except as listed in HPI above).    Objective:   Physical Exam  BP 122/82   Pulse 70   Temp 98.4 F (36.9 C) (Oral)   Wt 243 lb (110.2 kg)   SpO2 97%   BMI 44.45 kg/m  Wt Readings from Last 3 Encounters:  09/11/17 243 lb (110.2 kg)  04/09/17 236 lb (107 kg)  04/08/17 200 lb (90.7 kg)    General: Appears her stated age, obese in NAD. Abdomen: Soft. Normal bowel sounds. No distention or masses noted.  Tender to palpation over the bladder area. No CVA tenderness.       Assessment & Plan:   Bladder Pressure and Dysuria:  Urinalysis: AZO interfered with urinalysis Will send urine culture eRx sent if for Keflex 500 mg BID x 5 days OK to take AZO  OTC Drink plenty of fluids  RTC as needed or if symptoms persist. Webb Silversmith, NP

## 2017-09-12 NOTE — Progress Notes (Signed)
NEUROPSYCHOLOGICAL EVALUATION   Name:    [name]  Date of Birth:   January 27, 1956 Date of Interview:  08/28/2017 Date of Testing:  09/04/2017   Date of Feedback:  09/13/2017       Background Information:  Reason for Referral:  [name] is a 61 y.o. right handed female referred by Dr. Kathrynn Ducking to assess her current level of cognitive functioning and assist in differential diagnosis. The current evaluation consisted of a review of available medical records, an interview with the patient and her husband, and the completion of a neuropsychological testing battery. Informed consent was obtained.  History of Presenting Problem:  [name] was in her usual state of health when she experienced a severe headache on 12/01/2016 and presented to [name] where brain imaging demonstrated a subarachnoid hemorrhage. She was stabilized and transferred to [name] neurointensive care unit on 12/02/2016 for further evaluation and treatment. Per records from [place], on 12/02/2016 she underwent a diagnostic cerebral angiogram which revealed a small extradural left cavernous segment aneurysm but no evidence of an intradural aneurysm or arteriovenous malformation or arterio-venous fistual that would explain the subarachnoid hemorrhage. A post-angio CT brain scan was obtained which reportedly demonstrated unchanged subarachnoid hemorrhage with interval increase in hydrocephalus. On 12/05/2016, within 10 minutes of completing a repeat diagnostic cerebral angiogram, [name] was noted to have a change in mental status with loss of mobility of her right upper and lower extremities, right sided clonus and disorientation to time and place. A stat CT brain scan was obtained and reportedly demonstrated stable findings from prior. ReoPro and aspirin were administered which slowly improved her neuro examination. On 12/06/2016 a repeat CT brain was obtained and demonstrated a left frontal  infarct with stable ventricular size. She was noted to have waxing and waning speech impairment and continued to work with PT, OT and ST. On 3/1, she was neurologically stable and deemed safe for transfer out of ICU, but unfortunately the next day she had altered mental status and a CT/CTA demonstrated severe vasospasm involving the left M1, MS, M3 and bilateral A1 and A2 segments. EEG was started on 12/09/2016 given concerns that her neuro exam may be related to seizure activity. It was discontinued the next day as it was negative for seizures. On 12/22/2016, she was transferred to inpatient rehabilitation. She was there for a week and was ultimately discharged home on 12/29/2016. Subsequently she participated in outpatient PT and ST. Her physician advised that she could return to work and she did so. However, she noticed difficulty performing tasks at work and as a result was very frustrated with low self confidence. Around 02/27/2017, the patient's husband observed her have a seizure-type event in her sleep. The seizure ended before EMS arrived, and when they arrived the patient had returned to baseline. They recommended the patient's husband take her to the ED if he wanted. He did, and they were advised there was a Janet wait and it might be better for them to go to Urgent Care the next day. She had two more seizure-type events later in the summer, within a week of each other. She went to the ED and CT showed no acute changes. She was put on Keppra. She ultimately decided to stop working as she was having difficulty performing tasks due to cognitive changes. She had one more seizure later in the summer when she smoked a cigarette (she was on Keppra at the time). EEG was  completed on 04/26/2017 which was read as abnormal secondary to left frontal slowing. It was noted this abnormality may be associated with the area of injury associated with the prior stroke. There was no definite epileptiform activity seen during the  recording.  Dr. Jannifer Franklin' notes indicate the patient demonstrated abulia syndrome status-post stroke, and [name]'s husband's report of her post-stroke functioning/behavior certainly supports this. 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 Since she stopped working, her husband notices she is more subdued, less talkative, more passive. He reported she does not initiate conversation anymore. She states that overall she feels "less astute". Her husband agrees and states she used to be very logical and anticipate things very well, but does not any longer. She does not want to make decisions, she leaves them to her husband. She also continues to have word finding difficulty and word substitutions/paraphasic errors. She has more trouble remembering overlearned information such as common passwords and has to look them up on a daily basis. She used to do a crossword puzzle every day but now it takes her much longer and sometimes she can't even finish them. She has left ingredients out when cooking dishes on at least two occasions. She manages her personal checkbook and notes she does not stay on top of it like she used to.  Upon direct questioning, the patient and her husband reported the following with regard to current cognitive functioning:  Forgetting recent conversations/events: Yes Repeating statements/questions: No, her husband has only noticed this on one occasion Misplacing/losing items: No Forgetting appointments or other obligations: No, she writes everything down and stays on top of them Forgetting to take medications: No, "I'm OCD about that", she is able to put in daily planners every 2 weeks and administer daily Difficulty concentrating: Yes Starting but not finishing tasks: Yes Distracted easily: Yes Processing information more slowly: Yes Writing  difficulty: Handwriting is sometimes perfectly fine, sometimes horrible, don't know why. Spelling difficulty: No Comprehension difficulty: Sometimes, she has to ask people to repeat what they have said Getting lost when driving: No. (She hasn't driven since July, except for on one occasion in October)  There is no evidence of anosognosia; she has awareness in to her deficits. Her husband states, "she knows what she doesn't know".  She has been sleeping much more than she did prior to the stroke. She also gets fatigued some during the day and occasionally has to take a nap. She sleeps soundly and snores per her husband. She has never had a sleep study, never been diagnosed with sleep apnea. Her appetite has been significantly reduced since the stroke. She lost 35 lbs after the stroke. She is eating better now but still only about 1/3 of a meal. Physically, she states she feels "pretty good". She has not had any falls, aside from in the context of seizures. Her husband notices she walks/moves slower than she used to.   Her mood is much more even keel since the stroke. She has not had "up moods" or "down moods". She has not been experiencing any significant anxiety. Her husband perceives her as very calm. He reports some mild changes in interpersonal functioning and mannerisms, noting that she phrases things differently than she used to, perhaps more formally. No inappropriate behaviors were reported. She has not had any visual illusions or hallucinations.  Psychiatric history is reportedly positive  for anxiety in the past. She was on Prozac and Wellbutrin in the past; Wellbutrin was discontinued but she continues to take Prozac. She has never seen a mental health provider; medications were prescribed by her PCP. She denies past or present suicidal ideation or intention.   There is no family history of stroke or cerebral aneurysm.    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  5 years ago, after her mother passed away, she wanted to do different type of work, and she started working at [employer]. She is seeking disability now.  Marital history: Married to [employer] since 1998. [name] has 2 daughters, they live in [place]. She has no biological children.  Alcohol: One drink occasionally Tobacco: Former daily smoker (1/3 ppd), quit in 1996. She has a "very occasional cigarette", for example when brother came for the summer, she had one.   Medical History:  Past Medical History:  Diagnosis Date  . Anxiety   . Arthritis    left hip  . GERD (gastroesophageal reflux disease)   . Hepatitis 1972   hep B  . Morbid obesity ([name])   . PONV (postoperative nausea and vomiting)   . Seizures ([name])     Current medications:  Outpatient Encounter Medications as of 09/13/2017  Medication Sig  . acetaminophen (TYLENOL) 500 MG tablet Take 2 tablets (1,000 mg total) by mouth every 6 (six) hours as needed for mild pain or moderate pain.  Marland Kitchen aspirin EC 81 MG tablet Take 81 mg by mouth daily.  . cephALEXin (KEFLEX) 500 MG capsule Take 1 capsule (500 mg total) by mouth 2 (two) times daily.  Marland Kitchen FLUoxetine (PROZAC) 10 MG capsule Take 10 mg by mouth daily.  Marland Kitchen FLUoxetine (PROZAC) 20 MG capsule Take 1 capsule (20 mg total) by mouth daily.  Marland Kitchen levETIRAcetam (KEPPRA) 750 MG tablet Take 1 tablet (750 mg total) by mouth 2 (two) times daily.  . Multiple Vitamin (MULTIVITAMIN WITH MINERALS) TABS Take 1 tablet by mouth daily.   . Omega-3 Fatty Acids (FISH OIL) 1200 MG CAPS Take 1 capsule by mouth 2 (two) times daily.   No facility-administered encounter medications on file as of 09/13/2017.      Current Examination:  Behavioral Observations:  Appearance: Appropriately dressed and groomed Gait: Ambulated independently, no gross  abnormalities observed Speech: Fluent; normal rate, rhythm and volume. Mild word finding difficulty observed during conversational speech. Thought process: Generally linear, goal directed Affect: Mildly blunted, mildly anxious Interpersonal: Pleasant, appropriate, engaged Orientation: Oriented to all spheres. Accurately named the current President and his predecessor.   Tests Administered: . Test of Premorbid Functioning (TOPF) . Wechsler Adult Intelligence Scale-Fourth Edition (WAIS-IV): Similarities, Information, Block Design, Matrix Reasoning, Arithmetic, Symbol Search, Coding and Digit Span subtests . Wechsler Memory Scale-Fourth Edition (WMS-IV) Adult Version (ages 37-69): Logical Memory I, II and Recognition subtests  . [place] Verbal Learning Test - 2nd Edition (CVLT-2) Standard Form . [employer] (WCST) . Repeatable Battery for the Assessment of Neuropsychological Status (RBANS) Form A: Figure Copy and Figure Recall subtests and Semantic Fluency subtest . Neuropsychological Assessment Battery (NAB) Language Module, Form 1:  Bill Payment subtest . Boston Naming Test (BNT) . Controlled Oral Word Association Test (COWAT) . Trail Making Test A and B . [place] Diagnostic Aphasia Examination (BDAE): Complex Ideational Material and Commands  Subtests . Clock Drawing Test . [name] Depression Inventory - Second edition (BDI-II) . Generalized Anxiety Disorder -7 item screener (GAD-7)  Test Results: Note: Standardized scores are presented only for use by appropriately trained professionals and to allow for any future test-retest comparison. These scores should not be interpreted without consideration of all the information that is contained in the rest of the report. The most recent standardization samples from the test publisher or other sources were used whenever possible to derive standard scores; scores were corrected for age, gender, ethnicity and education when available.    Test Scores:  Test Name Raw Score Standardized Score Descriptor  TOPF 61/70 SS= 118 High average  WAIS-IV Subtests     Similarities 27/36 ss= 11 Average  Information 12/26 ss= 9 Average  Block Design 30/66 ss= 8 Low end of average  Matrix Reasoning 18/26 ss= 12 High average  Arithmetic 13/22 ss= 9 Average  Symbol Search 26/60 ss= 9 Average  Coding 53/135 ss= 9 Average  Digit Span 22/48 ss= 8 Low end of average  WAIS-IV Index Scores     Verbal Comprehension  SS= 100 Average  Perceptual Reasoning  SS= 100 Average  Working Memory  SS= 92 Average  Processing Speed  SS= 94 Average  Full Scale IQ (8 subtest)  SS= 96 Average  WMS-IV Subtests     LM I 24/50 ss= 10 Average  LM II 19/50 ss= 9 Average  LM II Recognition 19/30 Cum %: 3-9 Impaired  CVLT-II Scores     Trial 1 6/16 Z= 0 Average  Trial 5 7/16 Z= -2 Impaired  Trials 1-5 total 28/80 T= 32 Borderline  SD Free Recall 3/16 Z= -2 Impaired  SD Cued Recall 6/16 Z= -2 Impaired  LD Free Recall 6/16 Z= -1.5 Borderline  LD Cued Recall 8/16 Z= -1.5 Borderline  Recognition Discriminability 14/16 hits; 7 false positives Z= -1.5 Borderline  Forced Choice Recognition 16/16  WNL  WCST     Total Errors 33 T= 32 Borderline  Perseverative Responses 33 T= 26 Impaired  Perseverative Errors 26 T= 26 Impaired  Conceptual Level Responses 23 T= 31 Borderline  Categories Completed 1 6-10% Impaired  Trials to Complete 1st Category 11 >16% WNL  Failure to Maintain Set 1    RBANS Subtests     Figure Copy 14/20 Z= -2.4 Impaired  Figure Recall 5/20 Z= -2.2 Impaired  Semantic Fluency 15/40 Z= -1.3 Low average  NAB Language subtest     Bill Payment 16/19 T= 19 Severely impaired  BNT 52/60 T= 40 Low average  COWAT-FAS 18 T= 30 Impaired  COWAT-Animals 14 T= 40 Low average  Trail Making Test A  29" 0 errors T= 56 Average  Trail Making Test B  101" 1 error T= 45 Average  BDAE Complex Ideational Material 10/12  Below expectation  Commands 15/15   WNL  Clock Drawing Test   WNL  BDI-II 34/63  Severe  GAD-7 15/21  Severe     Description of Test Results:  Embedded performance validity indicators were within normal limits. As such, the patient's current performance on neurocognitive testing is judged to be a relatively accurate representation of her current level of neurocognitive functioning.   Premorbid verbal intellectual abilities were estimated to have been within the high average range based on a test of word reading. Meanwhile, current verbal intellectual abilities and current full-scale IQ both fell within the average range. There were no statistically significant differences among the four indices that comprise  the full scale IQ.  Psychomotor processing speed was average.   Auditory attention and working memory were average.   Visual-spatial construction was variable. Her ability to manipulate three dimensional blocks to match two dimensional stimuli was low end of average. Her drawn copy of a complex geometric figure was impaired. On the latter task, there were no signs of gross misperception but there were indications of reduced attention to details.   Language abilities were below expectation. Specifically, confrontation naming was low average, and semantic verbal fluency was low average. Auditory comprehension of complex ideational material was below expectation, but auditory comprehension of multiple-step commands was intact. On a simulated bill payment task requiring multiple aspects of receptive and expressive language (and executive functioning), she performed in the severely impaired range.   With regard to verbal memory, encoding and acquisition of non-contextual information (i.e., word list) was borderline impaired across five learning trials with a flat learning curve. After a brief interference task, free recall was impaired (3/16 items). He recalled 3 additional items with semantic cueing, but this still fell within  the impaired range. After a delay, free recall was borderline impaired (6/16 items). Cued recall was borderline impaired (8/16 items). Performance on a yes/no recognition task was impaired due to elevated number of false positive errors. On another verbal memory test, encoding and acquisition of contextual auditory information (i.e., short stories) was average. After a delay, free recall was average. Performance on a yes/no recognition task was below average. With regard to non-verbal memory, delayed free recall of visual information was impaired.   On tests measuring various aspects of executive functioning, performance was variable, ranging from impaired to high average. Mental flexibility and set-shifting were average on Trails B. Verbal fluency with phonemic search restrictions was impaired. Verbal abstract reasoning was average. Non-verbal abstract reasoning was high average. Deductive reasoning was impaired; on a card sorting task requiring trial and error based on immediate feedback, she quickly identified the first rule but was unable to identify and shift to the second category. Performance on a clock drawing task was normal.   On a self-report measure of mood, the patient's responses were indicative of clinically significant depression at the present time. Symptoms endorsed included: sadness, pessimism, feelings of failure, anhedonia, guilty feelings, punishment feelings, self-disappointment, self-criticalness, tearfulness, agitation, indecisiveness, feelings of worthlessness, loss of energy, increased sleep, irritability, reduced appetite, concentration difficulty, fatigue, and reduced libido. She also endorsed passive suicidal ideation but denied intention or plan. (During clinical interview, she denied suicidal ideation, and upon follow-up she again denied any suicidal ideation or intention.) On a self-report measure of anxiety, the patient endorsed clinically significant generalized anxiety  characterized by restlessness, irritability, fear of something awful happening, excessive worry, and nervousness.    Clinical Impressions: Major neurocognitive disorder due to CVA; Adjustment disorder with depressed mood. Results of this evaluation reveal demonstrate significant decline in several areas of cognitive function relative to estimated baseline abilities. The greatest impairment is seen in memory encoding (especially for non-contextual information) and deductive reasoning. Language abilities, including verbal fluency and confrontation naming, area also below expectation. This cognitive profile is consistent with her history of left frontal CVA. Based on these findings and the impact of cognitive deficits on IADLs, she meets criteria for major neurocognitive disorder.  She is also experiencing mood and behavior changes, including lack of motivation/interest, that are common in frontal lobe injury. Additionally, she is adjusting emotionally to the changes in her life post-stroke and her reduced cognitive abilities, which  also affects her mood and self-confidence. Fortunately, I do think her daily functioning and quality of life may improve with implementation of the following recommendations.    Recommendations/Plan: 1. Sleep study is recommended to evaluate for sleep apnea. 2. She may benefit from cognitive rehabilitation at [name]; the results of this report can be used to guide treatment/areas of focus. 3. I would consider Wellbutrin or stimulant medication for decreased initiation and cognitive dysfunction. Of course, I defer all medication decisions to the expertise of her treating physician. I understand that Wellbutrin was discontinued to increased risk of seizures, and I am not sure if there are contraindications for stimulant medication for this patient. 4. Stroke prevention is of course of utmost importance, and to that end she should certainly continue to avoid any tobacco  use/smoking. She is also encouraged to begin cardiovascular exercise and adhere to dietary recommendations of her treating physicians. 5. Routine and structure in daily life will be important for her given reduced initiation/motivation, and will help her maximize cognitive abilities. 6. I have recommended individual psychotherapy/counseling for post-CVA adjustment, particularly around acceptance of changes and increased self compassion. She accepted a referral to [name]. 7. Due to her major neurocognitive disorder and associated cognitive deficits, it is my professional opinion that she is NOT able to be gainfully employed. I am not surprised she had difficulty when she tried to return to work earlier this year. Given that she is almost one year post-CVA, I am doubtful that her cognitive function could improve enough to make working again possible; however, this could be reassessed after cognitive rehabilitation and possible medication changes. I understand she is applying for SSDI and I am in support of her claim.   Feedback to Patient: [name] and her husband returned for a feedback appointment on 09/13/2017 to review the results of her neuropsychological evaluation with this provider. 45 minutes face-to-face time was spent reviewing her test results, my impressions and my recommendations as detailed above. A copy of this report was also mailed to the patient.   Total time spent on this patient's case: [id number] unit for interview with psychologist; [phone] units of testing by psychometrician under psychologist's supervision; [phone] units for medical record review, scoring of neuropsychological tests, interpretation of test results, preparation of this report, and review of results to the patient by psychologist.      Thank you for your referral of [name]. Please feel free to contact me if you have any questions or concerns regarding this  report.

## 2017-09-13 ENCOUNTER — Telehealth: Payer: Self-pay | Admitting: Neurology

## 2017-09-13 ENCOUNTER — Ambulatory Visit (INDEPENDENT_AMBULATORY_CARE_PROVIDER_SITE_OTHER): Payer: BLUE CROSS/BLUE SHIELD | Admitting: Psychology

## 2017-09-13 ENCOUNTER — Encounter: Payer: Self-pay | Admitting: Psychology

## 2017-09-13 DIAGNOSIS — I6990 Unspecified sequelae of unspecified cerebrovascular disease: Secondary | ICD-10-CM | POA: Diagnosis not present

## 2017-09-13 DIAGNOSIS — I609 Nontraumatic subarachnoid hemorrhage, unspecified: Secondary | ICD-10-CM | POA: Diagnosis not present

## 2017-09-13 DIAGNOSIS — F0280 Dementia in other diseases classified elsewhere without behavioral disturbance: Secondary | ICD-10-CM

## 2017-09-13 DIAGNOSIS — F4321 Adjustment disorder with depressed mood: Secondary | ICD-10-CM

## 2017-09-13 LAB — URINE CULTURE
MICRO NUMBER:: 81362096
SPECIMEN QUALITY: ADEQUATE

## 2017-09-13 NOTE — Telephone Encounter (Signed)
Patient and husband arrived in lobby this afternoon stating she has an apt in February but her benefits run out next week. She is requesting a sooner apt. Best call back is [phone]

## 2017-09-13 NOTE — Telephone Encounter (Signed)
Okay for work and if possible, schedule may be tight however.

## 2017-09-13 NOTE — Telephone Encounter (Signed)
Neuropsychological testing does show some frontal lobe deficits.  The patient will follow-up in our office in the near future.   Neuropsychological testing 09/13/17:  Clinical Impressions: Major neurocognitive disorder due to CVA; Adjustment disorder with depressed mood. Results of this evaluation reveal demonstrate significant decline in several areas of cognitive function relative to estimated baseline abilities. The greatest impairment is seen in memory encoding (especially for non-contextual information) and deductive reasoning. Language abilities, including verbal fluency and confrontation naming, area also below expectation. This cognitive profile is consistent with her history of left frontal CVA. Based on these findings and the impact of cognitive deficits on IADLs, she meets criteria for major neurocognitive disorder.  She is also experiencing mood and behavior changes, including lack of motivation/interest, that are common in frontal lobe injury. Additionally, she is adjusting emotionally to the changes in her life post-stroke and her reduced cognitive abilities, which also affects her mood and self-confidence. Fortunately, I do think her daily functioning and quality of life may improve with implementation of the following recommendations.    Recommendations/Plan: 1. Sleep study is recommended to evaluate for sleep apnea. 2. She may benefit from cognitive rehabilitation at [facility]; the results of this report can be used to guide treatment/areas of focus. 3. I would consider Wellbutrin or stimulant medication for decreased initiation and cognitive dysfunction. Of course, I defer all medication decisions to the expertise of her treating physician. I understand that Wellbutrin was discontinued to increased risk of seizures, and I am not sure if there are contraindications for stimulant medication for this patient. 4. Stroke prevention is of course of utmost importance, and to that  end she should certainly continue to avoid any tobacco use/smoking. She is also encouraged to begin cardiovascular exercise and adhere to dietary recommendations of her treating physicians. 5. Routine and structure in daily life will be important for her given reduced initiation/motivation, and will help her maximize cognitive abilities. 6. I have recommended individual psychotherapy/counseling for post-CVA adjustment, particularly around acceptance of changes and increased self compassion. She accepted a referral to [facility]. 7. Due to her major neurocognitive disorder and associated cognitive deficits, it is my professional opinion that she is NOT able to be gainfully employed. I am not surprised she had difficulty when she tried to return to work earlier this year. Given that she is almost one year post-CVA, I am doubtful that her cognitive function could improve enough to make working again possible; however, this could be reassessed after cognitive rehabilitation and possible medication changes. I understand she is applying for SSDI and I am in support of her claim.

## 2017-09-19 ENCOUNTER — Telehealth: Payer: Self-pay | Admitting: Neurology

## 2017-09-19 NOTE — Telephone Encounter (Signed)
I will need to see her before filling out any disability forms.  The office visit does need to be documented in the records.

## 2017-09-19 NOTE — Telephone Encounter (Signed)
Dr. Jannifer Franklin- please advise Called pt again. Scheduled appt for 09/21/17 at 12pm, check in 1130am.  She is going to lose her benefits if they do not receive paperwork by Friday from MD. Advised since her last visit 04/09/17 she may need to come in and be evaluated again. Advised I have not gotten paperwork and asked her to re-fax to [phone].  She is wondering if she has to come in, or if Dr. Jannifer Franklin can write her a letter extending her time out of work and reason why.

## 2017-09-19 NOTE — Telephone Encounter (Signed)
See other phone note from 09/19/17

## 2017-09-19 NOTE — Telephone Encounter (Signed)
Pt called she faxed 12 pages to Terrence Dupont [phone]) about 8pm last night. She is wanting to ensure it was rec'd, she said it needs to be faxed back to Jarrett Ables by this Friday. Please call to advise at [phone]

## 2017-09-19 NOTE — Telephone Encounter (Signed)
Called pt back. Advised she does need to be seen prior to CW,MD filling out any more paperwork. She verbalized understanding and will keep appt made for this Friday at 12pm, check in 1130am. She will call to let people know requesting paperwork that she is coming for appt/update them on what is going on.

## 2017-09-19 NOTE — Telephone Encounter (Signed)
Tried calling pt regarding message from last week re: benefits running out this week and requesting f/u. Got busy signal. If she calls, can offer this Friday at 12pm, check in 1130am.   Regarding paperwork, since our office just reopened d/t inclement weather, we have not received paperwork yet. We have 7-14 business days to complete that paperwork and there is a 50 dollar fee. Please let her know if she calls

## 2017-09-21 ENCOUNTER — Encounter: Payer: Self-pay | Admitting: Neurology

## 2017-09-21 ENCOUNTER — Ambulatory Visit (INDEPENDENT_AMBULATORY_CARE_PROVIDER_SITE_OTHER): Payer: BLUE CROSS/BLUE SHIELD | Admitting: Neurology

## 2017-09-21 VITALS — BP 122/87 | HR 77 | Ht 64.0 in | Wt 244.0 lb

## 2017-09-21 DIAGNOSIS — F3342 Major depressive disorder, recurrent, in full remission: Secondary | ICD-10-CM

## 2017-09-21 DIAGNOSIS — R569 Unspecified convulsions: Secondary | ICD-10-CM | POA: Diagnosis not present

## 2017-09-21 DIAGNOSIS — R413 Other amnesia: Secondary | ICD-10-CM | POA: Insufficient documentation

## 2017-09-21 DIAGNOSIS — G479 Sleep disorder, unspecified: Secondary | ICD-10-CM

## 2017-09-21 DIAGNOSIS — I609 Nontraumatic subarachnoid hemorrhage, unspecified: Secondary | ICD-10-CM

## 2017-09-21 HISTORY — DX: Other amnesia: R41.3

## 2017-09-21 MED ORDER — FLUOXETINE HCL 20 MG PO CAPS: 20.0000 mg | ORAL_CAPSULE | Freq: Every day | ORAL | 3 refills | Status: DC

## 2017-09-21 MED ORDER — FLUOXETINE HCL 10 MG PO CAPS
10.0000 mg | ORAL_CAPSULE | Freq: Every day | ORAL | 3 refills | Status: DC
Start: 2017-09-21 — End: 2018-03-25

## 2017-09-21 NOTE — Progress Notes (Signed)
Reason for visit: Seizures  [name] is an 61 y.o. female  History of present illness:  [name] is a 59 year old right-handed white female with a history of a subarachnoid hemorrhage that occurred in February 2018, the patient sustained a left frontal stroke following this event, and she began having seizures several months later.  Her last seizure was in early July 2018.  The patient has been placed on Keppra, she has not had any seizures on her current dosing.  She is not yet back to driving.  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  The patient has improved to some degree, but she continues to have cognitive issues.  She will underwent formal neuropsychological evaluation, this was done around 13 September 2017.  The patient was felt to have some impairment in memory and deductive reasoning with difficulties also in verbal fluency and confrontational naming.  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  The patient is reporting some difficulty with excessive daytime drowsiness, she snores quite a bit at night.  A sleep study was recommended, and neurocognitive rehab was recommended as well.  The patient returns to this office for an evaluation.  Past Medical History:  Diagnosis Date  . Anxiety   . Arthritis    left hip  . GERD (gastroesophageal reflux disease)   . Hepatitis 1972   hep B  . Memory disorder 09/21/2017  . Morbid obesity ([place])   . PONV (postoperative nausea and vomiting)   . Seizures ([place])     Past Surgical History:  Procedure Laterality Date  . ABDOMINAL HYSTERECTOMY  04/1999  . ARTHROSCOPIC REPAIR ACL     bilateral knees  . BREAST EXCISIONAL BIOPSY Right 2008   NEG  . BREAST EXCISIONAL BIOPSY Left YRS AGO   X2 - NEG  . BREAST LUMPECTOMY  06/1979; 03/1995   left  . CHOLECYSTECTOMY  09/1987  .  LAPAROSCOPIC GASTRIC SLEEVE RESECTION  09/30/2012   Procedure: LAPAROSCOPIC GASTRIC SLEEVE RESECTION;  Surgeon: Madilyn Hook, DO;  Location: [facility];  Service: General;  Laterality: N/A;  . TOTAL HIP ARTHROPLASTY  11/13/2012   left  . UPPER GI ENDOSCOPY  09/30/2012   Procedure: UPPER GI ENDOSCOPY;  Surgeon: Madilyn Hook, DO;  Location: [facility];  Service: General;;    Family History  Problem Relation Age of Onset  . Hyperlipidemia Mother   . Obesity Mother   . Pulmonary fibrosis Mother   . Cancer Maternal Aunt        ovarian cancer  . Cancer Father   . Colon cancer Paternal Grandmother   . Esophageal cancer Neg Hx   . Stomach cancer Neg Hx   . Rectal cancer Neg Hx   . Breast cancer Neg Hx     Social history:  reports that she quit smoking about 16 years ago. Her smoking use included cigarettes. She has a 1.25 pack-year smoking history. she has never used smokeless tobacco. She reports that she drinks alcohol. She reports that she does not use drugs.   No Known Allergies  Medications:  Prior to Admission medications   Medication Sig Start Date End Date Taking? Authorizing Provider  acetaminophen (TYLENOL) 500 MG tablet Take 2 tablets (1,000 mg total) by mouth every 6 (six) hours as needed for mild pain or moderate pain. 04/21/15  Yes Betancourt, Aura Fey,  NP  aspirin EC 81 MG tablet Take 81 mg by mouth daily.   Yes [provider]  FLUoxetine (PROZAC) 10 MG capsule Take 10 mg by mouth daily.   Yes [provider]  FLUoxetine (PROZAC) 20 MG capsule Take 1 capsule (20 mg total) by mouth daily. 06/29/17  Yes Burnard Hawthorne, FNP  levETIRAcetam (KEPPRA) 750 MG tablet Take 1 tablet (750 mg total) by mouth 2 (two) times daily. 04/27/17  Yes Kathrynn Ducking, MD  Multiple Vitamin (MULTIVITAMIN WITH MINERALS) TABS Take 1 tablet by mouth daily.    Yes [provider]  Omega-3 Fatty Acids (FISH OIL) 1200 MG CAPS Take 1 capsule by mouth 2 (two) times daily.   Yes  [provider]    ROS:  Out of a complete 14 system review of symptoms, the patient complains only of the following symptoms, and all other reviewed systems are negative.  Daytime sleepiness, snoring Incontinence of the bladder, frequency of urination Memory loss, dizziness, headache Agitation, decreased concentration, anxiety  Blood pressure 122/87, pulse 77, height 5\' 4"  (1.626 m), weight 244 lb (110.7 kg).  Physical Exam  General: The patient is alert and cooperative at the time of the examination.  The patient is moderately to markedly obese.  Skin: No significant peripheral edema is noted.   Neurologic Exam  Mental status: The patient is alert and oriented x 3 at the time of the examination. The patient has apparent normal recent and remote memory, with an apparently normal attention span and concentration ability.   Cranial nerves: Facial symmetry is present. Speech is normal, no aphasia or dysarthria is noted. Extraocular movements are full. Visual fields are full.  Motor: The patient has good strength in all 4 extremities.  Sensory examination: Soft touch sensation is symmetric on the face, arms, and legs.  Coordination: The patient has good finger-nose-finger and heel-to-shin bilaterally.  Gait and station: The patient has a normal gait. Tandem gait is slightly unsteady. Romberg is negative. No drift is seen.  Reflexes: Deep tendon reflexes are symmetric.   Assessment/Plan:  1.  Prior subarachnoid hemorrhage, left frontal stroke  2.  Seizures secondary to #1  3.  Cognitive impairment  The formal neuropsychological evaluation suggests that the patient does have significant cognitive impairments, the conclusion was the patient would not be able to return to her prior job.  She was given a form to continue her leave of absence.  The patient will be sent for a sleep evaluation to exclude sleep apnea as this could also impair cognitive functioning.  The  patient will be sent for a speech therapy evaluation for neurocognitive training.  The patient was given a prescription for Prozac, she will continue on Keppra, she will follow-up in 6 months.  She may return to driving a motor vehicle in early January 2019.  Jill Alexanders MD 09/21/2017 12:33 PM  [facility] [address] [place], [place] [address]  Phone [phone] Fax [phone]

## 2017-10-03 ENCOUNTER — Ambulatory Visit: Payer: Self-pay | Admitting: Neurology

## 2017-10-04 ENCOUNTER — Ambulatory Visit: Payer: BLUE CROSS/BLUE SHIELD | Attending: Neurology | Admitting: Speech Pathology

## 2017-10-04 ENCOUNTER — Other Ambulatory Visit: Payer: Self-pay

## 2017-10-04 DIAGNOSIS — R4701 Aphasia: Secondary | ICD-10-CM | POA: Diagnosis present

## 2017-10-04 DIAGNOSIS — R41841 Cognitive communication deficit: Secondary | ICD-10-CM | POA: Insufficient documentation

## 2017-10-04 NOTE — Patient Instructions (Signed)
Tips for Talking with People who have Aphasia  . Say one thing at a time . Don't  rush - slow down, be patient . Talk face to face . Reduce background noise . Relax - be natural . Use pen and paper . Write down key words . Draw diagrams or pictures . Don't pretend you understand . Ask what helps . Recap - check you both understand . Be a partner, not a therapist   Aphasia does not affect intelligence, only language. The person with aphasia can still: make decisions, have opinions, and socialize.   Describing words  What group does it belong to?  What do I use it for?  Where can I find it?  What does it LOOK like?  What other words go with it?  What is the 1st sound of the word?  Many Ways to Communicate  Describe it Write it Draw it Gesture it Use related words  There's an App for that: Family Feud, Heads up, Stop-fun categories, What if, Conversation [address], [place], Actor

## 2017-10-04 NOTE — Therapy (Signed)
[facility] [address] [place], [place], [address] Phone: [phone]   Fax:  [phone]  Speech Language Pathology Evaluation  Patient Details  Name: [name] MRN: [id number] Date of Birth: [birth year] Referring Provider: Dr. Jannifer Franklin   Encounter Date: 10/04/2017  End of Session - 10/04/17 1919    Visit Number  1    Number of Visits  9    Date for SLP Re-Evaluation  11/09/17    SLP Start Time  4742    SLP Stop Time   1530    SLP Time Calculation (min)  43 min    Activity Tolerance  Patient tolerated treatment well       Past Medical History:  Diagnosis Date  . Anxiety   . Arthritis    left hip  . GERD (gastroesophageal reflux disease)   . Hepatitis 1972   hep B  . Memory disorder 09/21/2017  . Morbid obesity ([place])   . PONV (postoperative nausea and vomiting)   . Seizures ([place])     Past Surgical History:  Procedure Laterality Date  . ABDOMINAL HYSTERECTOMY  04/1999  . ARTHROSCOPIC REPAIR ACL     bilateral knees  . BREAST EXCISIONAL BIOPSY Right 2008   NEG  . BREAST EXCISIONAL BIOPSY Left YRS AGO   X2 - NEG  . BREAST LUMPECTOMY  06/1979; 03/1995   left  . CHOLECYSTECTOMY  09/1987  . LAPAROSCOPIC GASTRIC SLEEVE RESECTION  09/30/2012   Procedure: LAPAROSCOPIC GASTRIC SLEEVE RESECTION;  Surgeon: Madilyn Hook, DO;  Location: [facility];  Service: General;  Laterality: N/A;  . TOTAL HIP ARTHROPLASTY  11/13/2012   left  . UPPER GI ENDOSCOPY  09/30/2012   Procedure: UPPER GI ENDOSCOPY;  Surgeon: Madilyn Hook, DO;  Location[facility];  Service: General;;    There were no vitals filed for this visit.  Subjective Assessment - 10/04/17 1452    Subjective  "If there's anything that I can improve I'd like to improve it."    Currently in Pain?  No/denies         SLP Evaluation [facility] - 10/04/17 1452      SLP Visit Information   SLP Received On  10/04/17    Referring Provider  Dr. Jannifer Franklin    Onset  Date  09/21/17 SAH, left frontal CVA Feb. 2018      General Information   HPI  Ms.[name] a 61 year old right-handed white female with a history of a subarachnoid hemorrhage that occurred in February 2018, and subsequent left frontal stroke. She participated in outpatient ST at [facility]. She began having seizures several months later. Her last seizure was in early July 2018. 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 Recently underwent formal neuropsychological evaluation 09/13/17, findings: greatest impairment is seen in memory encoding (especially for non-contextual information) and deductive reasoning. Language abilities, including verbal fluency and confrontation naming, area also below expectation.    Behavioral/Cognition  alert, cooperative    Mobility Status  ambulated without assistance      Prior Functional Status   Cognitive/Linguistic Baseline  Within functional limits    Type of Home  House     Lives With  Spouse    Vocation  -- applying for disability      Cognition   Overall Cognitive Status  Impaired/Different from baseline    Area of Impairment  Memory;Problem solving;Following commands    Memory  Decreased  short-term memory    Problem Solving  Decreased initiation    Attention  Focused;Sustained    Focused Attention  Appears intact    Sustained Attention  Appears intact    Memory  Impaired    Memory Impairment  Decreased recall of new information;Storage deficit    Awareness  Appears intact    Executive Function  Organizing;Initiating;Reasoning    Reasoning  Impaired    Reasoning Impairment  Functional complex;Verbal complex    Organizing  Impaired    Organizing Impairment  Verbal complex;Functional complex    Initiating  Impaired    Initiating Impairment  Verbal complex;Functional complex      Auditory Comprehension   Overall Auditory Comprehension  Appears within functional limits for tasks  assessed    Yes/No Questions  Within Functional Limits    Conversation  Moderately complex      Visual Recognition/Discrimination   Discrimination  Within Function Limits      Reading Comprehension   Reading Status  Not tested      Expression   Primary Mode of Expression  Verbal      Verbal Expression   Overall Verbal Expression  Impaired    Initiation  No impairment    Automatic Speech  Name;Social Response    Level of Generative/Spontaneous Verbalization  Conversation    Repetition  No impairment    Naming  Impairment    Confrontation  75-100% accurate BNT 51/60    Divergent  -- 10 /s/ words in 60 seconds    Other Naming Comments  hesitations, word-finding difficulties in conversation    Verbal Errors  Aware of errors    Pragmatics  Impairment    Impairments  Abnormal affect    Effective Techniques  Open ended questions;Semantic cues;Sentence completion;Phonemic cues    Non-Verbal Means of Communication  Not applicable      Written Expression   Dominant Hand  Right    Written Expression  Not tested    Overall Writen Expression  will assess further in therapy      Oral Motor/Sensory Function   Overall Oral Motor/Sensory Function  Appears within functional limits for tasks assessed      Motor Speech   Overall Motor Speech  Appears within functional limits for tasks assessed      Standardized Assessments   Standardized Assessments   Montreal Cognitive Assessment (MOCA);Other Assessment    Montreal Cognitive Assessment ([place])   27/30                      SLP Education - 10/04/17 1919    Education provided  Yes    Education Details  recovery after CVA, proposed therapy goals    Person(s) Educated  Patient;Spouse    Methods  Explanation    Comprehension  Verbalized understanding         SLP Long Term Goals - 10/04/17 1921      SLP LONG TERM GOAL #1   Title  Pt will perform simple-mod complex organization and reasoning tasks with 85% accuracy.     Time  4    Period  Weeks    Status  New      SLP LONG TERM GOAL #2   Title  Pt will utilize external aids to manage schedule, meds, and finances with rare min A over 2 sessions.     Time  4    Period  Weeks    Status  New  SLP LONG TERM GOAL #3   Title  Pt will demo compensations for word finding, functionally, in 10 minutes simple-mod complex conversation.     Time  4    Period  Weeks    Status  New       Plan - 10/04/17 1940    Clinical Impression Statement  Patient presents today with deficits in verbal expression, fluency, memory and executive function. She reports wordfinding issues in conversations, difficulties balancing her checkbook (husband manages bill-paying). Her husband reports pt forgetting details of her appointments, forgetting entire conversations, and initiating conversations less often. Pt does not plan to return to work, however she reports decreased quality of life due to her impairments. Pt would benefit from skilled ST to address verbal expression and cognitive deficits, with emphasis on compensations for impairments given time post-onset.     Speech Therapy Frequency  2x / week    Duration  4 weeks    Treatment/Interventions  Cognitive reorganization;Multimodal communcation approach;Compensatory strategies;Cueing hierarchy;Internal/external aids;Functional tasks;SLP instruction and feedback;Patient/family education    Potential to Achieve Goals  Good    Consulted and Agree with Plan of Care  Patient       Patient will benefit from skilled therapeutic intervention in order to improve the following deficits and impairments:   Aphasia  Cognitive communication deficit    Problem List Patient Active Problem List   Diagnosis Date Noted  . Memory disorder 09/21/2017  . Seizures ([place]) 04/09/2017  . SAH (subarachnoid hemorrhage) ([place]) 01/08/2017  . Aneurysm, carotid artery, internal 01/08/2017  . AKI (acute kidney injury) ([place]) 01/08/2017  . Screening  for breast cancer 05/21/2015  . Routine general medical examination at a health care facility 05/21/2015  . Special screening for malignant neoplasms, colon 01/13/2013  . Other and unspecified hyperlipidemia 12/19/2012  . Morbid obesity with BMI of 45.0-49.9, adult ([place]) 09/23/2012  . Depression 07/26/2011   Deneise Lever, [place], [place] Speech-Language Pathologist  Aliene Altes 10/04/2017, 7:41 PM  [facility] [address] [place], [place], [address] Phone: [phone]   Fax:  [phone]  Name: Lorna Strother MRN: [id number] Date of Birth: 12-07-1955

## 2017-10-08 ENCOUNTER — Ambulatory Visit: Payer: BLUE CROSS/BLUE SHIELD | Admitting: Speech Pathology

## 2017-10-08 ENCOUNTER — Other Ambulatory Visit: Payer: Self-pay

## 2017-10-08 DIAGNOSIS — R41841 Cognitive communication deficit: Secondary | ICD-10-CM

## 2017-10-08 DIAGNOSIS — R4701 Aphasia: Secondary | ICD-10-CM

## 2017-10-08 NOTE — Therapy (Signed)
[facility] [address] [place], [place], [address] Phone: [phone]   Fax:  [phone]  Speech Language Pathology Treatment  Patient Details  Name: [name] MRN: [id number] Date of Birth: September 01, 1956 Referring Provider: Dr. Jannifer Franklin   Encounter Date: 10/08/2017  End of Session - 10/08/17 1012    Visit Number  2    Number of Visits  9    Date for SLP Re-Evaluation  11/09/17    SLP Start Time  0801    SLP Stop Time   8527    SLP Time Calculation (min)  43 min    Activity Tolerance  Patient tolerated treatment well       Past Medical History:  Diagnosis Date  . Anxiety   . Arthritis    left hip  . GERD (gastroesophageal reflux disease)   . Hepatitis 1972   hep B  . Memory disorder 09/21/2017  . Morbid obesity ([place])   . PONV (postoperative nausea and vomiting)   . Seizures ([place])     Past Surgical History:  Procedure Laterality Date  . ABDOMINAL HYSTERECTOMY  04/1999  . ARTHROSCOPIC REPAIR ACL     bilateral knees  . BREAST EXCISIONAL BIOPSY Right 2008   NEG  . BREAST EXCISIONAL BIOPSY Left YRS AGO   X2 - NEG  . BREAST LUMPECTOMY  06/1979; 03/1995   left  . CHOLECYSTECTOMY  09/1987  . LAPAROSCOPIC GASTRIC SLEEVE RESECTION  09/30/2012   Procedure: LAPAROSCOPIC GASTRIC SLEEVE RESECTION;  Surgeon: Madilyn Hook, DO;  Location: [facility];  Service: General;  Laterality: N/A;  . TOTAL HIP ARTHROPLASTY  11/13/2012   left  . UPPER GI ENDOSCOPY  09/30/2012   Procedure: UPPER GI ENDOSCOPY;  Surgeon: Madilyn Hook, DO;  Location: [facility];  Service: General;;    There were no vitals filed for this visit.  Subjective Assessment - 10/08/17 0804    Subjective  "I've hardly done anything really."    Currently in Pain?  No/denies            ADULT SLP TREATMENT - 10/08/17 0801      General Information   Behavior/Cognition  Alert;Cooperative;Pleasant mood    Patient Positioning  Upright in chair       Treatment Provided   Treatment provided  Cognitive-Linquistic      Pain Assessment   Pain Assessment  No/denies pain      Cognitive-Linquistic Treatment   Treatment focused on  Aphasia;Cognition    Skilled Treatment  Pt arrives with husband, telling SLP about an instance over the weekend where pt had wordfinding trouble in a conversation with her husband. They report she was ultimately able to convey her message. SLP provided skilled feedback re: other descriptions pt might have used and cuing strategies to facilitate wordfinding to pt's husband. Provided written handout re: communication strategies and questioning cues pt's husband can use at home. Husband expressing concerns over pt's reduced initiation of conversations at home. SLP educated re: impacts of stroke on initiation and strategies to elicit more conversation, including asking open ended questions. Pt showed SLP her planner; SLP provided written and verbal education calendar/to-do list use for recall. Pt required verbal cues initially fading to gesture cues to record reminders and information in her planner. In simple reasoning puzzle pt required occasional min A for initiation, organizing information. To be completed at home.       Assessment / Recommendations / Plan   Plan  Continue with current plan  of care      Progression Toward Goals   Progression toward goals  Progressing toward goals       SLP Education - 10/08/17 1012    Education provided  Yes    Education Details  communication strategies, use of a planner/memory system    Person(s) Educated  Patient;Spouse    Methods  Explanation;Verbal cues;Handout    Comprehension  Verbalized understanding;Need further instruction         SLP Long Term Goals - 10/08/17 1014      SLP LONG TERM GOAL #1   Title  Pt will perform simple-mod complex organization and reasoning tasks with 85% accuracy.    Time  4    Period  Weeks    Status  On-going      SLP LONG TERM GOAL #2    Title  Pt will utilize external aids to manage schedule, meds, and finances with rare min A over 2 sessions.     Time  4    Period  Weeks    Status  On-going      SLP LONG TERM GOAL #3   Title  Pt will demo compensations for word finding, functionally, in 10 minutes simple-mod complex conversation.     Time  4    Period  Weeks    Status  On-going       Plan - 10/08/17 1013    Clinical Impression Statement  Patient presents today with deficits in verbal expression, fluency, memory and executive function. Decreased task initiation seen today. Continue skilled ST to address verbal expression and cognitive deficits, with emphasis on compensations for impairments given time post-onset.     Speech Therapy Frequency  2x / week    Duration  4 weeks    Treatment/Interventions  Cognitive reorganization;Multimodal communcation approach;Compensatory strategies;Cueing hierarchy;Internal/external aids;Functional tasks;SLP instruction and feedback;Patient/family education    Potential to Achieve Goals  Good    SLP Home Exercise Plan  reviewed/provided    Consulted and Agree with Plan of Care  Patient;Family member/caregiver    Family Member Consulted  husband       Patient will benefit from skilled therapeutic intervention in order to improve the following deficits and impairments:   Aphasia  Cognitive communication deficit    Problem List Patient Active Problem List   Diagnosis Date Noted  . Memory disorder 09/21/2017  . Seizures ([place]) 04/09/2017  . SAH (subarachnoid hemorrhage) ([place]) 01/08/2017  . Aneurysm, carotid artery, internal 01/08/2017  . AKI (acute kidney injury) ([place]) 01/08/2017  . Screening for breast cancer 05/21/2015  . Routine general medical examination at a health care facility 05/21/2015  . Special screening for malignant neoplasms, colon 01/13/2013  . Other and unspecified hyperlipidemia 12/19/2012  . Morbid obesity with BMI of 45.0-49.9, adult ([place]) 09/23/2012  .  Depression 07/26/2011   Deneise Lever, [place], [place] Speech-Language Pathologist  Aliene Altes 10/08/2017, 10:15 AM  [facility] [address] [place], [place], [address] Phone: [phone]   Fax:  [phone]   Name: [name] MRN: [id number] Date of Birth: 1955-10-24

## 2017-10-11 ENCOUNTER — Other Ambulatory Visit: Payer: Self-pay

## 2017-10-11 ENCOUNTER — Ambulatory Visit: Payer: BLUE CROSS/BLUE SHIELD | Attending: Neurology | Admitting: Speech Pathology

## 2017-10-11 DIAGNOSIS — R4701 Aphasia: Secondary | ICD-10-CM | POA: Diagnosis present

## 2017-10-11 DIAGNOSIS — R41841 Cognitive communication deficit: Secondary | ICD-10-CM | POA: Diagnosis not present

## 2017-10-11 NOTE — Patient Instructions (Signed)
  Cognitive Activities you can do at home:   - [place] (easy level)  - [place] (Taboo)  - [place] (kind of like Hot Potato with words)  On your computer, tablet or phone: Constant Therapy - has a free trial period [employer] Neuronation App [employer] Game App [employer] IQ Logic [place] Sort it out (easy) Photo Quiz  - what's the word Mix 2 Words Spot the difference games  Other things: spend time reading every day, journaling

## 2017-10-11 NOTE — Therapy (Signed)
[facility] [address] [place], [place], [address] Phone: [phone]   Fax:  [phone]  Speech Language Pathology Treatment  Patient Details  Name: [name] MRN: [id number] Date of Birth: 11/10/1955 Referring Provider: Dr. Jannifer Franklin   Encounter Date: 10/11/2017  End of Session - 10/11/17 1229    Visit Number  3    Number of Visits  9    Date for SLP Re-Evaluation  11/09/17    SLP Start Time  0804    SLP Stop Time   0848    SLP Time Calculation (min)  44 min    Activity Tolerance  Patient tolerated treatment well       Past Medical History:  Diagnosis Date  . Anxiety   . Arthritis    left hip  . GERD (gastroesophageal reflux disease)   . Hepatitis 1972   hep B  . Memory disorder 09/21/2017  . Morbid obesity ([place])   . PONV (postoperative nausea and vomiting)   . Seizures ([place])     Past Surgical History:  Procedure Laterality Date  . ABDOMINAL HYSTERECTOMY  04/1999  . ARTHROSCOPIC REPAIR ACL     bilateral knees  . BREAST EXCISIONAL BIOPSY Right 2008   NEG  . BREAST EXCISIONAL BIOPSY Left YRS AGO   X2 - NEG  . BREAST LUMPECTOMY  06/1979; 03/1995   left  . CHOLECYSTECTOMY  09/1987  . LAPAROSCOPIC GASTRIC SLEEVE RESECTION  09/30/2012   Procedure: LAPAROSCOPIC GASTRIC SLEEVE RESECTION;  Surgeon: Madilyn Hook, DO;  Location: [facility];  Service: General;  Laterality: N/A;  . TOTAL HIP ARTHROPLASTY  11/13/2012   left  . UPPER GI ENDOSCOPY  09/30/2012   Procedure: UPPER GI ENDOSCOPY;  Surgeon: Madilyn Hook, DO;  [facility];  Service: General;;    There were no vitals filed for this visit.  Subjective Assessment - 10/11/17 0806    Subjective  "Fine. I got it all done." re: HW    Currently in Pain?  No/denies            ADULT SLP TREATMENT - 10/11/17 0806      General Information   Behavior/Cognition  Alert;Cooperative;Pleasant mood    Patient Positioning  Upright in chair      Treatment Provided   Treatment provided  Cognitive-Linquistic      Pain Assessment   Pain Assessment  No/denies pain      Cognitive-Linquistic Treatment   Treatment focused on  Aphasia;Cognition    Skilled Treatment  Pt arrived with planner and homework completed. Min A to identify one incomplete section on deduction puzzle 1; min A and extended time required to identify correct information. Pt showed SLP her planner and items she added to her to do list. Husband states pt independently requested his work schedule and recorded pertinent details in her planner. SLP worked with pt to identify steps to break down a multi-step task she is planning, to repair her credit due to missed payments on 3 accounts due to hospitalization. Pt required min A for details during this task. SLP educated pt and husband re: cognitive activities for home and strategies to promote conversation. Pt identified a topic (international news) she can research and initiate conversations about. SLP targeted naming, description during word game played between pt and husband. Pt required occasional min-mod A for description. Anomia more evident during this task than during mod complex conversation.       Assessment / Recommendations / Plan  Plan  Continue with current plan of care      Progression Toward Goals   Progression toward goals  Progressing toward goals       SLP Education - 10/11/17 1228    Education provided  Yes    Education Details  cognitive/language activities for home    Person(s) Educated  Patient;Spouse    Methods  Explanation;Handout    Comprehension  Verbalized understanding         SLP Long Term Goals - 10/11/17 1231      SLP LONG TERM GOAL #1   Title  Pt will perform simple-mod complex organization and reasoning tasks with 85% accuracy.    Time  4    Period  Weeks    Status  On-going      SLP LONG TERM GOAL #2   Title  Pt will utilize external aids to manage schedule, meds, and finances  with rare min A over 2 sessions.     Time  4    Period  Weeks    Status  On-going      SLP LONG TERM GOAL #3   Title  Pt will demo compensations for word finding, functionally, in 10 minutes simple-mod complex conversation.     Time  4    Period  Weeks    Status  On-going       Plan - 10/11/17 1229    Clinical Impression Statement  Patient presents today with deficits in verbal expression, fluency, memory and executive function. Spouse reports pt taking initiative to request information for her planner at home. Continue skilled ST to address verbal expression and cognitive deficits, with emphasis on compensations for impairments given time post-onset.     Speech Therapy Frequency  2x / week    Duration  4 weeks    Treatment/Interventions  Cognitive reorganization;Multimodal communcation approach;Compensatory strategies;Cueing hierarchy;Internal/external aids;Functional tasks;SLP instruction and feedback;Patient/family education    Potential to Achieve Goals  Good    SLP Home Exercise Plan  reviewed/provided    Consulted and Agree with Plan of Care  Patient;Family member/caregiver    Family Member Consulted  husband       Patient will benefit from skilled therapeutic intervention in order to improve the following deficits and impairments:   Cognitive communication deficit  Aphasia    Problem List Patient Active Problem List   Diagnosis Date Noted  . Memory disorder 09/21/2017  . Seizures ([place]) 04/09/2017  . SAH (subarachnoid hemorrhage) ([place]) 01/08/2017  . Aneurysm, carotid artery, internal 01/08/2017  . AKI (acute kidney injury) ([place]) 01/08/2017  . Screening for breast cancer 05/21/2015  . Routine general medical examination at a health care facility 05/21/2015  . Special screening for malignant neoplasms, colon 01/13/2013  . Other and unspecified hyperlipidemia 12/19/2012  . Morbid obesity with BMI of 45.0-49.9, adult ([place]) 09/23/2012  . Depression 07/26/2011    Deneise Lever, [place], [place] Speech-Language Pathologist  Aliene Altes 10/11/2017, 12:32 PM  [facility] [address] [place], [place], [address] Phone: [phone]   Fax:  [phone]   Name: [name] MRN: [id number] Date of Birth: 16-Feb-1956

## 2017-10-18 ENCOUNTER — Ambulatory Visit: Payer: BLUE CROSS/BLUE SHIELD | Admitting: Speech Pathology

## 2017-10-22 ENCOUNTER — Ambulatory Visit: Payer: BLUE CROSS/BLUE SHIELD | Admitting: Speech Pathology

## 2017-10-22 DIAGNOSIS — R4701 Aphasia: Secondary | ICD-10-CM

## 2017-10-22 DIAGNOSIS — R41841 Cognitive communication deficit: Secondary | ICD-10-CM | POA: Diagnosis not present

## 2017-10-22 NOTE — Therapy (Signed)
[facility] [address] [place], [place], [address] Phone: [phone]   Fax:  [phone]  Speech Language Pathology Treatment and Discharge Summary  Patient Details  Name: [name] MRN: [id number] Date of Birth: 06/28/1956 Referring Provider: Dr. Jannifer Franklin   Encounter Date: 10/22/2017  End of Session - 10/22/17 1332    Visit Number  4    Number of Visits  9    Date for SLP Re-Evaluation  11/09/17    SLP Start Time  0845    SLP Stop Time   0928    SLP Time Calculation (min)  43 min    Activity Tolerance  Patient tolerated treatment well       Past Medical History:  Diagnosis Date  . Anxiety   . Arthritis    left hip  . GERD (gastroesophageal reflux disease)   . Hepatitis 1972   hep B  . Memory disorder 09/21/2017  . Morbid obesity ([place])   . PONV (postoperative nausea and vomiting)   . Seizures ([place])     Past Surgical History:  Procedure Laterality Date  . ABDOMINAL HYSTERECTOMY  04/1999  . ARTHROSCOPIC REPAIR ACL     bilateral knees  . BREAST EXCISIONAL BIOPSY Right 2008   NEG  . BREAST EXCISIONAL BIOPSY Left YRS AGO   X2 - NEG  . BREAST LUMPECTOMY  06/1979; 03/1995   left  . CHOLECYSTECTOMY  09/1987  . LAPAROSCOPIC GASTRIC SLEEVE RESECTION  09/30/2012   Procedure: LAPAROSCOPIC GASTRIC SLEEVE RESECTION;  Surgeon: Madilyn Hook, DO;  [facility];  Service: General;  Laterality: N/A;  . TOTAL HIP ARTHROPLASTY  11/13/2012   left  . UPPER GI ENDOSCOPY  09/30/2012   Procedure: UPPER GI ENDOSCOPY;  Surgeon: Madilyn Hook, DO;  [facility];  Service: General;;    There were no vitals filed for this visit.  Subjective Assessment - 10/22/17 0848    Subjective  Pt reports she got a new dog over the weekend, has been working care in to her routine.    Currently in Pain?  No/denies            ADULT SLP TREATMENT - 10/22/17 0846      General Information   Behavior/Cognition   Alert;Cooperative;Pleasant mood    Patient Positioning  Upright in chair      Treatment Provided   Treatment provided  Cognitive-Linquistic      Pain Assessment   Pain Assessment  No/denies pain      Cognitive-Linquistic Treatment   Treatment focused on  Aphasia;Cognition    Skilled Treatment  SLP facilitated 10 minutes simple-mod complex conversation; pt's conversation WFL. Pt and husband report pt using compensations for wordfinding more frequently at home and husband reports allowing pt to find her words vs "helping her." Simple to mod-complex reasoning puzzles 85% accuracy initial min cues, fading to supervision with pt using recommended strategies and 100% accuracy. They report pt is doing well managing her schedule and feel she has made great progress over the past 2 weeks, verbalize understanding of cognitive activities for home. SLP worked with pt and husband to identify activities to increase communication opportunities including scheduling phone conversations with friends and family on her planner, suggestions for volunteering and community activities.       Assessment / Recommendations / Plan   Plan  Discharge SLP treatment due to (comment);All goals met      Progression Toward Goals   Progression toward goals  Goals  met, education completed, patient discharged from [facility] - 10/22/17 1332    Education provided  Yes    Education Details  Social and volunteer opportunities    Person(s) Educated  Patient;Spouse    Methods  Explanation    Comprehension  Verbalized understanding         SLP Long Term Goals - 10/22/17 0855      SLP LONG TERM GOAL #1   Title  Pt will perform simple-mod complex organization and reasoning tasks with 85% accuracy.    Time  3    Period  Weeks    Status  Achieved      SLP LONG TERM GOAL #2   Title  Pt will utilize external aids to manage schedule, meds, and finances with rare min A over 2 sessions.     Time  3    Period   Weeks    Status  Achieved      SLP LONG TERM GOAL #3   Title  Pt will demo compensations for word finding, functionally, in 10 minutes simple-mod complex conversation.     Time  3    Period  Weeks    Status  Achieved       Plan - 10/22/17 1333    Clinical Impression Statement  Patient presents today with functional speech and language in 10 minutes conversation. Spouse, patient report using supportive conversation strategies at home. They report functional improvements in memory and organization with pt's use of planner at home. Deficits persist in executive function, memory, and language, however pt is compensating well with trained strategies and compensatory aids. At this time pt and husband report they are pleased with her progress and current functioning. Long term goals have been met and pt agreeable to discharge. Education provided re: cognitive activities for home as well as maintenance of routines and external aids.     Speech Therapy Frequency  -- d/c    Duration  -- d/c    Treatment/Interventions  Cognitive reorganization;Multimodal communcation approach;Compensatory strategies;Cueing hierarchy;Internal/external aids;Functional tasks;SLP instruction and feedback;Patient/family education    Potential to Achieve Goals  Good    SLP Home Exercise Plan  reviewed/provided    Consulted and Agree with Plan of Care  Patient;Family member/caregiver    Family Member Consulted  husband       Patient will benefit from skilled therapeutic intervention in order to improve the following deficits and impairments:   Cognitive communication deficit  Aphasia    Problem List Patient Active Problem List   Diagnosis Date Noted  . Memory disorder 09/21/2017  . Seizures ([place]) 04/09/2017  . SAH (subarachnoid hemorrhage) ([place]) 01/08/2017  . Aneurysm, carotid artery, internal 01/08/2017  . AKI (acute kidney injury) ([place]) 01/08/2017  . Screening for breast cancer 05/21/2015  . Routine general  medical examination at a health care facility 05/21/2015  . Special screening for malignant neoplasms, colon 01/13/2013  . Other and unspecified hyperlipidemia 12/19/2012  . Morbid obesity with BMI of 45.0-49.9, adult ([place]) 09/23/2012  . Depression 07/26/2011   SPEECH THERAPY DISCHARGE SUMMARY  Visits from Start of Care: 4  Current functional level related to goals / functional outcomes: See goals above. Pt using compensatory aids and strategies for deficits with supervision.    Remaining deficits: Deficits persist in reasoning, organization, memory and expressive language.   Education / Equipment: Recommendations for cognitive activities for home and community resources.   Plan: Patient agrees to discharge.  Patient  goals were met. Patient is being discharged due to meeting the stated rehab goals.  ?????       Deneise Lever, [place], CCC-SLP Speech-Language Pathologist  Aliene Altes 10/22/2017, 1:38 PM  [facility] [address] [place], [place], [address] Phone: [phone]   Fax:  [phone]   Name: [name] MRN: [id number] Date of Birth: [birth year]

## 2017-10-22 NOTE — Patient Instructions (Signed)
Keep socializing to keep your mind active  -Consider scheduling phone calls with friends or family -Make dates for outings with friends -Consider classes, book clubs, volunteer activities  -Do cognitive activities everyday; that can be reading and journaling, playing a game, playing cards  (see cognitive activities for home list given a few weeks ago, including tablet and application recommendations) - continue using your planner and routines

## 2017-10-25 ENCOUNTER — Encounter: Payer: Self-pay | Admitting: Neurology

## 2017-10-25 ENCOUNTER — Ambulatory Visit (INDEPENDENT_AMBULATORY_CARE_PROVIDER_SITE_OTHER): Payer: BLUE CROSS/BLUE SHIELD | Admitting: Neurology

## 2017-10-25 ENCOUNTER — Encounter: Payer: Self-pay | Admitting: Speech Pathology

## 2017-10-25 VITALS — BP 137/91 | HR 98 | Ht 64.0 in | Wt 242.0 lb

## 2017-10-25 DIAGNOSIS — G4710 Hypersomnia, unspecified: Secondary | ICD-10-CM | POA: Diagnosis not present

## 2017-10-25 DIAGNOSIS — R569 Unspecified convulsions: Secondary | ICD-10-CM | POA: Diagnosis not present

## 2017-10-25 DIAGNOSIS — I69020 Aphasia following nontraumatic subarachnoid hemorrhage: Secondary | ICD-10-CM | POA: Insufficient documentation

## 2017-10-25 DIAGNOSIS — G4730 Sleep apnea, unspecified: Secondary | ICD-10-CM

## 2017-10-25 DIAGNOSIS — I609 Nontraumatic subarachnoid hemorrhage, unspecified: Secondary | ICD-10-CM | POA: Diagnosis not present

## 2017-10-25 DIAGNOSIS — G4719 Other hypersomnia: Secondary | ICD-10-CM

## 2017-10-25 DIAGNOSIS — R0683 Snoring: Secondary | ICD-10-CM

## 2017-10-25 NOTE — Progress Notes (Signed)
SLEEP MEDICINE CLINIC   Provider:  Larey Seat, M D  Primary Care Physician:  Burnard Hawthorne, FNP   Referring Provider: Dr. Lenor Coffin, MD    Chief Complaint  Patient presents with  . New Patient (Initial Visit)    pt alone, rm 10. pt states since the stroke she snores more then she used to, sleeps more then she used to wakes up intermittently throught the night.     HPI:  [name] is a 62 y.o. female , seen here in a referral from Dr. Jannifer Franklin and has seen Dr. Si Raider Myrle Sheng for cognitive testing.  This is Dr. Jannifer Franklin introduction to his patient ; [name] is a 43 year old right-handed white female with a history of a subarachnoid hemorrhage that occurred in February 2018, the patient sustained a left frontal stroke following this event, and she began having seizures several months later.  Her last seizure was in early July 2018.  The patient has been placed on Keppra, she has not had any seizures on her current dosing.  She is not yet back to driving.  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  The patient has improved to some degree, but she continues to have cognitive issues.  She will underwent formal neuropsychological evaluation, this was done around 13 September 2017.  The patient was felt to have some impairment in memory and deductive reasoning with difficulties also in verbal fluency and confrontational naming.  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  The patient is reporting some difficulty with excessive daytime drowsiness, she snores quite a bit at night.  A sleep study was recommended, and neurocognitive rehab was recommended as well.  The patient returns to this office for an evaluation.  Sleep consult from 25 October 2017.  This is my first meeting with [name] who is an established patient of Dr.  Tobey Grim.  [name] history is quoted above, she has seizures she had a left frontal stroke in February 2018 and began having seizures after that cognitive issues which have been addressed with Dr. Bonita Quin. Her husband has noted that her daytime sleepiness is high, he has noted that she sleeps much longer and seems to also fall asleep more often, now snoring.  This hypersomnia seems to have been exacerbated since either the stroke occurred or related to the seizure medication.  Sleep habits are as follows: The patient's bedtime is usually around 9 PM, falls asleep rather promptly, waking up at about midnight due to nocturia, returning to sleep and then waking up again at about 1 AM for another bathroom break. She is not a restless sleeper at baseline, refers to sleep on her side on one pillow.  She sleeps on a flat mattress.  She knows that she is dreaming but she does not remember her dreams and she has not enacted or sleep-walked. The marital bedroom is described as cool, dark and quiet.  She has a bedroom with her husband.  She is only aware 2 bathroom breaks that wake her most nights- From there on she can sleep through until the morning hours and she rises around 8 AM. She got a new dog which is waking her now up a little earlier.  She estimates about 10 hours of sleep. She feels OK, not in discomfort and rested.  By about 2:00  just after lunch she begins feeling tired and sleepy year again.  He does take daytime naps, these last at least an hour.   Sleep medical history and family sleep history:  Mother had pulmonary disease, Fibrosis.  Her father is still alive and sleeps well, one brother alive reporting poor sleep quality. The patient did not undergo tonsillectomy in childhood, she did not have a history of traumatic brain injury; her arachnoid hemorrhage appears to have been spontaneous, not related to trauma or an aneurysm.  Social history: [name] is married, her husband  brought children into the marriage, he does not have biological children.  She has never worked night shifts.  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 She drinks 1-2 cups of coffee in the morning, 2 glasses of iced tea at afternoon and evening. She rarely drinks sodas. She has 3-4 alcoholic drinks a week.  The patient was a smoker and smoked until 2014.  Review of Systems:Out of a complete 14 system review, the patient complains of only the following symptoms, and all other reviewed systems are negative. Snoring,  Apneas have been witnessed by spouse. Hypersomnia   How likely are you to doze in the following situations: 0 = not likely, 1 = slight chance, 2 = moderate chance, 3 = high chance  Sitting and Reading? 1  Watching Television?1 Sitting inactive in a public place (theater or meeting)?0 As a passenger in a car for an hour without a break?2 Lying down in the afternoon when circumstances permit?3 Sitting and talking to someone?0 Sitting quietly after lunch without alcohol?1-2 In a car, while stopped for a few minutes in traffic?0   Total =9   Fatigue severity score17  , depression score 4/ 15 , since stroke.    Social History   Socioeconomic History  . Marital status: Married    Spouse name: Not on file  . Number of children: 0  . Years of education: BS  . Highest education level: Not on file  Social Needs  . Financial resource strain: Not on file  . Food insecurity - worry: Not on file  . Food insecurity - inability: Not on file  . Transportation needs - medical: Not on file  . Transportation needs - non-medical: Not on file  Occupational History  . Not on file  Tobacco Use  . Smoking status: Former Smoker    Packs/day: 0.25    Years: 5.00    Pack years: 1.25    Types: Cigarettes    Last attempt to quit: 07/25/2001    Years since quitting: 16.2  . Smokeless tobacco: Never Used  Substance and Sexual Activity  . Alcohol use:  Yes    Alcohol/week: 0.0 oz    Comment: 2 glasses of wine per week  . Drug use: No  . Sexual activity: Not on file  Other Topics Concern  . Not on file  Social History Narrative   Lives with spouse   Caffeine use: Drinks coffee daily   Right handed    Family History  Problem Relation Age of Onset  . Hyperlipidemia Mother   . Obesity Mother   . Pulmonary fibrosis Mother   . Cancer Maternal Aunt        ovarian cancer  . Cancer Father   . Colon cancer Paternal Grandmother   . Esophageal cancer Neg Hx   . Stomach cancer Neg Hx   . Rectal cancer Neg Hx   .  Breast cancer Neg Hx     Past Medical History:  Diagnosis Date  . Anxiety   . Arthritis    left hip  . GERD (gastroesophageal reflux disease)   . Hepatitis 1972   hep B  . Memory disorder 09/21/2017  . Morbid obesity ([place])   . PONV (postoperative nausea and vomiting)   . Seizures ([place])     Past Surgical History:  Procedure Laterality Date  . ABDOMINAL HYSTERECTOMY  04/1999  . ARTHROSCOPIC REPAIR ACL     bilateral knees  . BREAST EXCISIONAL BIOPSY Right 2008   NEG  . BREAST EXCISIONAL BIOPSY Left YRS AGO   X2 - NEG  . BREAST LUMPECTOMY  06/1979; 03/1995   left  . CHOLECYSTECTOMY  09/1987  . LAPAROSCOPIC GASTRIC SLEEVE RESECTION  09/30/2012   Procedure: LAPAROSCOPIC GASTRIC SLEEVE RESECTION;  Surgeon: Madilyn Hook, DO;  Location: [facility];  Service: General;  Laterality: N/A;  . TOTAL HIP ARTHROPLASTY  11/13/2012   left  . UPPER GI ENDOSCOPY  09/30/2012   Procedure: UPPER GI ENDOSCOPY;  Surgeon: Madilyn Hook, DO;  Location: [facility];  Service: General;;    Current Outpatient Medications  Medication Sig Dispense Refill  . acetaminophen (TYLENOL) 500 MG tablet Take 2 tablets (1,000 mg total) by mouth every 6 (six) hours as needed for mild pain or moderate pain. 30 tablet 0  . aspirin EC 81 MG tablet Take 81 mg by mouth daily.    Marland Kitchen FLUoxetine (PROZAC) 10 MG capsule Take 1 capsule (10 mg total) by mouth daily. 90  capsule 3  . FLUoxetine (PROZAC) 20 MG capsule Take 1 capsule (20 mg total) by mouth daily. 90 capsule 3  . levETIRAcetam (KEPPRA) 750 MG tablet Take 1 tablet (750 mg total) by mouth 2 (two) times daily. 180 tablet 3  . Multiple Vitamin (MULTIVITAMIN WITH MINERALS) TABS Take 1 tablet by mouth daily.     . Omega-3 Fatty Acids (FISH OIL) 1200 MG CAPS Take 1 capsule by mouth 2 (two) times daily.     No current facility-administered medications for this visit.     Allergies as of 10/25/2017  . (No Known Allergies)    Vitals: BP (!) 137/91   Pulse 98   Ht 5\' 4"  (1.626 m)   Wt 242 lb (109.8 kg)   BMI 41.54 kg/m  Last Weight:  Wt Readings from Last 1 Encounters:  10/25/17 242 lb (109.8 kg)   LPF:XTKW mass index is 41.54 kg/m.     Last Height:   Ht Readings from Last 1 Encounters:  10/25/17 5\' 4"  (1.626 m)    Physical exam:  General: The patient is awake, alert and appears not in acute distress. The patient is well groomed. Head: Normocephalic, atraumatic. Neck is supple. Mallampati 3  neck circumference:15 Nasal airflow patent . No retrognathia is seen.  Cardiovascular:  Regular rate and rhythm , without  murmurs or carotid bruit, and without distended neck veins. Respiratory: Lungs are clear to auscultation. Skin:  Without evidence of edema, or rash Trunk: BMI is 42. The patient's posture is erect   Neurologic exam : The patient is awake and alert, oriented to place and time.   Memory subjective  described as Impaired  Memory testing :  See Dr. Jannifer Franklin Note.  MOCA:No flowsheet data found. MMSE:No flowsheet data found. Attention span & concentration ability appears normal.  Speech is fluent,  without  dysarthria, dysphonia and with mild Aphasia - difficulties to find noun, often midsentence .  Mood and affect are appropriate.  Cranial nerves: Pupils are equal and briskly reactive to light.  Extraocular movements  in vertical and horizontal planes intact and without  nystagmus. Visual fields by finger perimetry are intact. Hearing to finger rub intact. Facial sensation intact to fine touch. Facial motor strength is symmetric and tongue and uvula move midline. Shoulder shrug was symmetrical.   Motor exam:   Normal tone, muscle bulk and symmetric strength in all extremities. Intact grip strength and pinch.  Sensory:  Fine touch, pinprick and vibration were tested in all extremities. Proprioception tested in the upper extremities was normal. Coordination:  Her handwriting has deteriorated . No problems with buttons, touch screens. Rapid alternating movements in the fingers/hands was normal. Finger-to-nose maneuver  without evidence of ataxia, dysmetria ,only  mild action tremor. Gait and station: Patient walks without assistive device -  Deep tendon reflexes: in the  upper and lower extremities are symmetric and intact.    Reviewed neuropsychological testing:  [facility]roke notes.    Assessment:  After physical and neurologic examination, review of laboratory studies,  Personal review of imaging studies, reports of other /same  Imaging studies, results of polysomnography and / or neurophysiology testing and pre-existing records as far as provided in visit., my assessment is   1) Mrs. Postel husband has observed her to snore and have apnea, but she has not woken up air hungry, choking and even a dry mouth is not present every morning.  Her strokes have not affected her pharyngeal motility, her airway has not changed.  Does have a significant elevation in body mass index, a larger neck circumference and a high-grade Mallampati which are risk factors. 2) hypersomnia, the patient's desire to sleep is higher than it used to be before stroke and before being treated with antiepileptic medication.  She also has noted that she seems a little more depressed at this could be a side effect of Keppra as well.  She is not on a very high dose, but may be sensitive to the side effects  such as sedation and  depression.  I see it is necessary to evaluate the patient for sleep apnea, and this study could be performed as either an attended sleep study in lab or as a home sleep test.  If apnea and hypoxemia are not present it would be necessary to evaluate for side effects of Keppra off sleepiness to some degree mild depression.  Her stroke affected the left brain hemisphere in this region is usually not responsible for hypersomnia.  The patient was advised of the nature of the diagnosed disorder , the treatment options and the  risks for general health and wellness arising from not treating the condition.   I spent more than 40 minutes of face to face time with the patient.  Greater than 50% of time was spent in counseling and coordination of care. We have discussed the diagnosis and differential and I answered the patient's questions.    Plan:  Treatment plan and additional workup :  1) SPLIT night PSG, and AHI 20, watch for hypoxemia, with seizure montage.    2) RV  If positive for sleep disorder.   Larey Seat, MD 04/05/3661, 9:47 PM  Certified in Neurology by [employer]BPN Certified in [place] by [facility] [address], [place], [place] [address]

## 2017-10-29 ENCOUNTER — Encounter: Payer: Self-pay | Admitting: Speech Pathology

## 2017-11-01 ENCOUNTER — Encounter: Payer: Self-pay | Admitting: Speech Pathology

## 2017-11-05 ENCOUNTER — Ambulatory Visit: Payer: PRIVATE HEALTH INSURANCE | Admitting: Psychology

## 2017-11-08 ENCOUNTER — Encounter: Payer: Self-pay | Admitting: Speech Pathology

## 2017-11-23 ENCOUNTER — Ambulatory Visit: Payer: BLUE CROSS/BLUE SHIELD | Admitting: Neurology

## 2017-11-28 ENCOUNTER — Telehealth: Payer: Self-pay | Admitting: Neurology

## 2017-11-28 ENCOUNTER — Other Ambulatory Visit: Payer: Self-pay | Admitting: Neurology

## 2017-11-28 DIAGNOSIS — R0683 Snoring: Secondary | ICD-10-CM

## 2017-11-28 DIAGNOSIS — G4719 Other hypersomnia: Secondary | ICD-10-CM

## 2017-11-28 NOTE — Telephone Encounter (Signed)
Please do. Use my diagnosis code form visit. CD

## 2017-11-28 NOTE — Telephone Encounter (Signed)
Order placed

## 2017-11-28 NOTE — Telephone Encounter (Signed)
Insurance denied in lab. Please place order for hst.

## 2018-01-10 ENCOUNTER — Other Ambulatory Visit: Payer: Self-pay | Admitting: Neurology

## 2018-01-22 ENCOUNTER — Encounter: Payer: Self-pay | Admitting: Family

## 2018-01-22 NOTE — Telephone Encounter (Signed)
I called and spoke with patient.  Advised that providers typically do not call in antibiotics for patients that they aren't actively treating for a condition requiring an antibiotic and that dental procedures that require antibiotics are usually handled by the dentist/oral surgeon performing the procedure.  Her situation is unusual and I advised I would speak with the provider and notify her tomorrow.

## 2018-01-23 ENCOUNTER — Other Ambulatory Visit: Payer: Self-pay | Admitting: Family

## 2018-01-23 DIAGNOSIS — Z96649 Presence of unspecified artificial hip joint: Secondary | ICD-10-CM

## 2018-01-23 MED ORDER — AMOXICILLIN 500 MG PO TABS: 2000.0000 mg | ORAL_TABLET | Freq: Once | ORAL | 2 refills | Status: AC

## 2018-01-23 NOTE — Progress Notes (Signed)
close

## 2018-03-13 ENCOUNTER — Telehealth: Payer: Self-pay | Admitting: Neurology

## 2018-03-13 NOTE — Telephone Encounter (Signed)
I am not sure that any labs will need to be drawn prior to her next revisit.

## 2018-03-13 NOTE — Telephone Encounter (Signed)
The last labs ordered by our office were completed in May of 2018. I will ask Dr. Jannifer Franklin which labs he would like ordered prior to pt's appt with Jinny Blossom, NP on 03/25/18.

## 2018-03-13 NOTE — Telephone Encounter (Signed)
Pt would like to know if re: her upcoming 6 mo f/u will blood work need to be done.  If so pt states she wants to know if she can come in a head of time and get that done.  Please call

## 2018-03-13 NOTE — Telephone Encounter (Signed)
I called pt, advised her that Dr. Jannifer Franklin has not ordered any labs prior to her appt on 03/25/18 with Jinny Blossom, NP. However, Jinny Blossom, NP may order labs at that visit. Pt verbalized understanding.

## 2018-03-25 ENCOUNTER — Encounter: Payer: Self-pay | Admitting: Adult Health

## 2018-03-25 ENCOUNTER — Ambulatory Visit: Payer: BLUE CROSS/BLUE SHIELD | Admitting: Adult Health

## 2018-03-25 VITALS — BP 128/82 | HR 69 | Ht 64.0 in | Wt 237.0 lb

## 2018-03-25 DIAGNOSIS — G4719 Other hypersomnia: Secondary | ICD-10-CM | POA: Diagnosis not present

## 2018-03-25 DIAGNOSIS — F329 Major depressive disorder, single episode, unspecified: Secondary | ICD-10-CM

## 2018-03-25 DIAGNOSIS — R569 Unspecified convulsions: Secondary | ICD-10-CM

## 2018-03-25 DIAGNOSIS — F32A Depression, unspecified: Secondary | ICD-10-CM

## 2018-03-25 MED ORDER — LEVETIRACETAM 750 MG PO TABS
750.0000 mg | ORAL_TABLET | Freq: Two times a day (BID) | ORAL | 3 refills | Status: DC
Start: 2018-03-25 — End: 2018-04-22

## 2018-03-25 MED ORDER — FLUOXETINE HCL 10 MG PO CAPS
10.0000 mg | ORAL_CAPSULE | Freq: Every day | ORAL | 3 refills | Status: DC
Start: 2018-03-25 — End: 2018-04-22

## 2018-03-25 MED ORDER — FLUOXETINE HCL 20 MG PO CAPS: 20.0000 mg | ORAL_CAPSULE | Freq: Every day | ORAL | 3 refills | Status: DC

## 2018-03-25 NOTE — Progress Notes (Signed)
Fax confirmation received [facility] [phone]. sy

## 2018-03-25 NOTE — Progress Notes (Signed)
PATIENT: [name] DOB: [birth year]  REASON FOR VISIT: follow up HISTORY FROM: patient  HISTORY OF PRESENT ILLNESS: Today 03/25/18 [name] is a 62 year old female with a history of subarachnoid hemorrhage with subsequent seizures.  She returns today for follow-up.  She is currently on Keppra 750 mg twice a day.  She denies any seizure events.  She lives at home with her husband.  She is able to complete all ADLs independently.  She reports that she primarily stays home most of the time.  She remains on Prozac.  She was evaluated for sleep apnea.  A sleep study was ordered but the patient has not scheduled this.  She returns today for evaluation.  HISTORY [name] is a 8 year old right-handed white female with a history of a subarachnoid hemorrhage that occurred in February 2018, the patient sustained a left frontal stroke following this event, and she began having seizures several months later.  Her last seizure was in early July 2018.  The patient has been placed on Keppra, she has not had any seizures on her current dosing.  She is not yet back to driving.  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  The patient has improved to some degree, but she continues to have cognitive issues.  She will underwent formal neuropsychological evaluation, this was done around 13 September 2017.  The patient was felt to have some impairment in memory and deductive reasoning with difficulties also in verbal fluency and confrontational naming.  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  The patient is reporting some difficulty with excessive daytime drowsiness, she snores quite a bit at night.  A sleep study was recommended, and neurocognitive rehab was recommended as well.  The patient returns to this office for an evaluation.    REVIEW OF SYSTEMS: Out  of a complete 14 system review of symptoms, the patient complains only of the following symptoms, and all other reviewed systems are negative.  See HPI ALLERGIES: No Known Allergies  HOME MEDICATIONS: Outpatient Medications Prior to Visit  Medication Sig Dispense Refill  . acetaminophen (TYLENOL) 500 MG tablet Take 2 tablets (1,000 mg total) by mouth every 6 (six) hours as needed for mild pain or moderate pain. 30 tablet 0  . aspirin EC 81 MG tablet Take 81 mg by mouth daily.    . B Complex Vitamins (VITAMIN-B COMPLEX PO) Take by mouth daily.    Marland Kitchen FLUoxetine (PROZAC) 10 MG capsule Take 1 capsule (10 mg total) by mouth daily. 90 capsule 3  . FLUoxetine (PROZAC) 20 MG capsule Take 1 capsule (20 mg total) by mouth daily. 90 capsule 3  . levETIRAcetam (KEPPRA) 750 MG tablet Take 1 tablet (750 mg total) by mouth 2 (two) times daily. 180 tablet 3  . Multiple Vitamin (MULTIVITAMIN WITH MINERALS) TABS Take 1 tablet by mouth daily.     . Omega-3 Fatty Acids (FISH OIL) 1200 MG CAPS Take 1 capsule by mouth 2 (two) times daily.    Marland Kitchen VITAMIN D, CHOLECALCIFEROL, PO Take by mouth daily.     No facility-administered medications prior to visit.     PAST MEDICAL HISTORY: Past Medical History:  Diagnosis Date  . Anxiety   . Arthritis    left hip  . GERD (gastroesophageal reflux disease)   . Hepatitis 1972   hep B  .  Memory disorder 09/21/2017  . Morbid obesity ([place])   . PONV (postoperative nausea and vomiting)   . Seizures ([place])     PAST SURGICAL HISTORY: Past Surgical History:  Procedure Laterality Date  . ABDOMINAL HYSTERECTOMY  04/1999  . ARTHROSCOPIC REPAIR ACL     bilateral knees  . BREAST EXCISIONAL BIOPSY Right 2008   NEG  . BREAST EXCISIONAL BIOPSY Left YRS AGO   X2 - NEG  . BREAST LUMPECTOMY  06/1979; 03/1995   left  . CHOLECYSTECTOMY  09/1987  . LAPAROSCOPIC GASTRIC SLEEVE RESECTION  09/30/2012   Procedure: LAPAROSCOPIC GASTRIC SLEEVE RESECTION;  Surgeon: Madilyn Hook, DO;   Location: [facility];  Service: General;  Laterality: N/A;  . TOTAL HIP ARTHROPLASTY  11/13/2012   left  . UPPER GI ENDOSCOPY  09/30/2012   Procedure: UPPER GI ENDOSCOPY;  Surgeon: Madilyn Hook, DO;  Location: [facility];  Service: General;;    FAMILY HISTORY: Family History  Problem Relation Age of Onset  . Hyperlipidemia Mother   . Obesity Mother   . Pulmonary fibrosis Mother   . Cancer Maternal Aunt        ovarian cancer  . Cancer Father   . Colon cancer Paternal Grandmother   . Esophageal cancer Neg Hx   . Stomach cancer Neg Hx   . Rectal cancer Neg Hx   . Breast cancer Neg Hx     SOCIAL HISTORY: Social History   Socioeconomic History  . Marital status: Married    Spouse name: Not on file  . Number of children: 0  . Years of education: BS  . Highest education level: Not on file  Occupational History  . Not on file  Social Needs  . Financial resource strain: Not on file  . Food insecurity:    Worry: Not on file    Inability: Not on file  . Transportation needs:    Medical: Not on file    Non-medical: Not on file  Tobacco Use  . Smoking status: Former Smoker    Packs/day: 0.25    Years: 5.00    Pack years: 1.25    Types: Cigarettes    Last attempt to quit: 07/25/2001    Years since quitting: 16.6  . Smokeless tobacco: Never Used  Substance and Sexual Activity  . Alcohol use: Yes    Alcohol/week: 0.0 oz    Comment: 2 glasses of wine per week  . Drug use: No  . Sexual activity: Not on file  Lifestyle  . Physical activity:    Days per week: Not on file    Minutes per session: Not on file  . Stress: Not on file  Relationships  . Social connections:    Talks on phone: Not on file    Gets together: Not on file    Attends religious service: Not on file    Active member of club or organization: Not on file    Attends meetings of clubs or organizations: Not on file    Relationship status: Not on file  . Intimate partner violence:    Fear of current or ex partner:  Not on file    Emotionally abused: Not on file    Physically abused: Not on file    Forced sexual activity: Not on file  Other Topics Concern  . Not on file  Social History Narrative   Lives with spouse   Caffeine use: Drinks coffee daily   Right handed      PHYSICAL EXAM  Vitals:   03/25/18 1246  BP: 128/82  Pulse: 69  Weight: 237 lb (107.5 kg)  Height: 5\' 4"  (1.626 m)   Body mass index is 40.68 kg/m.  Generalized: Well developed, in no acute distress   Neurological examination  Mentation: Alert oriented to time, place, history taking. Follows all commands speech and language fluent Cranial nerve II-XII: Pupils were equal round reactive to light. Extraocular movements were full, visual field were full on confrontational test. Facial sensation and strength were normal. Uvula tongue midline. Head turning and shoulder shrug  were normal and symmetric. Motor: The motor testing reveals 5 over 5 strength of all 4 extremities. Good symmetric motor tone is noted throughout.  Sensory: Sensory testing is intact to soft touch on all 4 extremities. No evidence of extinction is noted.  Coordination: Cerebellar testing reveals good finger-nose-finger and heel-to-shin bilaterally.  Gait and station: Gait is normal.  Reflexes: Deep tendon reflexes are symmetric and normal bilaterally.   DIAGNOSTIC DATA (LABS, IMAGING, TESTING) - I reviewed patient records, labs, notes, testing and imaging myself where available.  Lab Results  Component Value Date   WBC 7.8 04/08/2017   HGB 12.4 04/08/2017   HCT 35.9 04/08/2017   MCV 91.2 04/08/2017   PLT 312 04/08/2017      Component Value Date/Time   NA 140 04/08/2017 1334   NA 140 11/18/2012 0530   K 3.9 04/08/2017 1334   K 4.0 11/18/2012 0530   CL 108 04/08/2017 1334   CL 108 (H) 11/18/2012 0530   CO2 27 04/08/2017 1334   CO2 27 11/18/2012 0530   GLUCOSE 96 04/08/2017 1334   GLUCOSE 95 11/18/2012 0530   BUN 16 04/08/2017 1334   BUN 8  11/18/2012 0530   CREATININE 1.43 (H) 04/08/2017 1334   CREATININE 0.62 11/18/2012 0530   CREATININE 1.01 08/02/2012 1144   CALCIUM 9.0 04/08/2017 1334   CALCIUM 7.9 (L) 11/18/2012 0530   PROT 6.8 01/08/2017 1147   ALBUMIN 3.7 01/08/2017 1147   AST 19 01/08/2017 1147   ALT 29 01/08/2017 1147   ALKPHOS 88 01/08/2017 1147   BILITOT 0.4 01/08/2017 1147   GFRNONAA 39 (L) 04/08/2017 1334   GFRNONAA >60 11/18/2012 0530   GFRAA 45 (L) 04/08/2017 1334   GFRAA >60 11/18/2012 0530   Lab Results  Component Value Date   CHOL 182 03/31/2014   HDL 53.60 03/31/2014   LDLCALC 112 (H) 03/31/2014   LDLDIRECT 146.6 12/19/2012   TRIG 80.0 03/31/2014   CHOLHDL 3 03/31/2014   No results found for: HGBA1C Lab Results  Component Value Date   VITAMINB12 >1500 (H) 03/31/2014   Lab Results  Component Value Date   TSH 2.13 03/31/2014      ASSESSMENT AND PLAN 62 y.o. year old female  has a past medical history of Anxiety, Arthritis, GERD (gastroesophageal reflux disease), Hepatitis (1972), Memory disorder (09/21/2017), Morbid obesity ([place]), PONV (postoperative nausea and vomiting), and Seizures ([place]). here with :  1.  Seizures 2.  Excessive daytime sleepiness 3. Depression    Overall the patient has done well.  She will continue on Keppra 750 mg twice a day.  I have encouraged the patient to schedule her home sleep test.  The patient voiced understanding.  She will stop our sleep lab today to make that appointment.prozac refilled. She is advised that if her symptoms worsen or she develops new symptoms she should let us know.  She will follow-up in 6 months or sooner if needed.  I spent 15 minutes with the patient. 50% of this time was spent discussing sleep study   Ward Givens, MSN, NP-C 03/25/2018, 1:34 PM [facility] [address], [place], [place] [address] [phone]

## 2018-03-25 NOTE — Patient Instructions (Signed)
Your Plan:  Continue Keppra 750 mg twice a day Schedule sleep study If your symptoms worsen or you develop new symptoms please let us know.   Thank you for coming to see [place] at [facility]. I hope we have been able to provide you high quality care today.  You may receive a patient satisfaction survey over the next few weeks. We would appreciate your feedback and comments so that we may continue to improve ourselves and the health of our patients.

## 2018-04-21 ENCOUNTER — Encounter: Payer: Self-pay | Admitting: Adult Health

## 2018-04-22 ENCOUNTER — Other Ambulatory Visit: Payer: Self-pay | Admitting: *Deleted

## 2018-04-22 DIAGNOSIS — F329 Major depressive disorder, single episode, unspecified: Secondary | ICD-10-CM

## 2018-04-22 DIAGNOSIS — F32A Depression, unspecified: Secondary | ICD-10-CM

## 2018-04-22 MED ORDER — LEVETIRACETAM 750 MG PO TABS: 750.0000 mg | ORAL_TABLET | Freq: Two times a day (BID) | ORAL | 3 refills | Status: DC

## 2018-04-22 MED ORDER — FLUOXETINE HCL 20 MG PO CAPS: 20.0000 mg | ORAL_CAPSULE | Freq: Every day | ORAL | 3 refills | Status: DC

## 2018-04-22 MED ORDER — FLUOXETINE HCL 10 MG PO CAPS: 10.0000 mg | ORAL_CAPSULE | Freq: Every day | ORAL | 3 refills | Status: DC

## 2018-04-24 ENCOUNTER — Ambulatory Visit: Payer: BLUE CROSS/BLUE SHIELD | Admitting: Neurology

## 2018-04-24 DIAGNOSIS — R0683 Snoring: Secondary | ICD-10-CM

## 2018-04-24 DIAGNOSIS — G4710 Hypersomnia, unspecified: Secondary | ICD-10-CM

## 2018-04-24 DIAGNOSIS — G4719 Other hypersomnia: Secondary | ICD-10-CM

## 2018-05-06 ENCOUNTER — Other Ambulatory Visit: Payer: Self-pay | Admitting: Neurology

## 2018-05-06 ENCOUNTER — Telehealth: Payer: Self-pay | Admitting: Neurology

## 2018-05-06 ENCOUNTER — Telehealth: Payer: Self-pay

## 2018-05-06 DIAGNOSIS — G4719 Other hypersomnia: Secondary | ICD-10-CM

## 2018-05-06 DIAGNOSIS — I609 Nontraumatic subarachnoid hemorrhage, unspecified: Secondary | ICD-10-CM

## 2018-05-06 DIAGNOSIS — R413 Other amnesia: Secondary | ICD-10-CM

## 2018-05-06 DIAGNOSIS — G4730 Sleep apnea, unspecified: Secondary | ICD-10-CM

## 2018-05-06 DIAGNOSIS — R569 Unspecified convulsions: Secondary | ICD-10-CM

## 2018-05-06 DIAGNOSIS — Z6841 Body Mass Index (BMI) 40.0 and over, adult: Principal | ICD-10-CM

## 2018-05-06 DIAGNOSIS — G4710 Hypersomnia, unspecified: Secondary | ICD-10-CM

## 2018-05-06 DIAGNOSIS — R0683 Snoring: Secondary | ICD-10-CM

## 2018-05-06 NOTE — Addendum Note (Signed)
Addended by: Larey Seat on: 05/06/2018 04:32 PM   Modules accepted: Orders

## 2018-05-06 NOTE — Telephone Encounter (Signed)
Ordered auto PAP- this patient has memory difficulty, has had brain bleed. Is that not a reason for attended study ? Marland Kitchen

## 2018-05-06 NOTE — Telephone Encounter (Signed)
I called pt. I advised pt that Dr. Brett Fairy reviewed their sleep study results and found that has mild to moderate sleep apnea and recommends that pt be treated with a cpap. Dr. Brett Fairy recommends that pt return for a repeat sleep study in order to properly titrate the cpap and ensure a good mask fit. Pt is agreeable to returning for a titration study. I advised pt that our sleep lab will file with pt's insurance and call pt to schedule the sleep study when we hear back from the pt's insurance regarding coverage of this sleep study. Pt verbalized understanding of results. Pt had no questions at this time but was encouraged to call back if questions arise.

## 2018-05-06 NOTE — Telephone Encounter (Signed)
I called pt. Advised the patient that the titration test was not approved. Pt will need to start a auto CPAP. I reviewed PAP compliance expectations with the pt. Pt is agreeable to starting a CPAP. I advised pt that an order will be sent to a DME, [facility], and [facility] will call the pt within about one week after they file with the pt's insurance. [facility] will show the pt how to use the machine, fit for masks, and troubleshoot the CPAP if needed. A follow up appt was made for insurance purposes with Dr. Brett Fairy on Oct 31,2019 at 9:30 am. Pt verbalized understanding to arrive 15 minutes early and bring their CPAP. A letter with all of this information in it will be mailed to the pt as a reminder. I verified with the pt that the address we have on file is correct. Pt verbalized understanding of results. Pt had no questions at this time but was encouraged to call back if questions arise.

## 2018-05-06 NOTE — Telephone Encounter (Signed)
Insurance will not pay for CPAP titration study after a positive HST.  You will need to order auto PAP at home first.  Then if patient is having difficulty tolerating machine, you can order an in lab titration sleep study.

## 2018-05-06 NOTE — Procedures (Signed)
[place], [place] [address] NAME:   [name]                                                               DOB: January 12, 1956 MEDICAL RECORD No:  [id number]                                             DOS:  04/24/2018 REFERRING PHYSICIAN: Lenor Coffin, M.D. STUDY PERFORMED: Home Sleep Study on apnea link  HISTORY: [name] is a 62 y.o. female seizure patient of Dr. Loreta Ave and has seen Dr. Si Raider Myrle Sheng for cognitive testing.  Dr. Jannifer Franklin introduced his patient; "[name] is a 58 year old right-handed white female with a history of a subarachnoid hemorrhage that occurred in February 2018, the patient sustained a left frontal stroke following this event, and she began having seizures several months later.  Her last seizure was in early July 2018.  The patient has been placed on Keppra, she has not had any seizures on her current dosing.  She is not yet back to driving.  The patient has reported some problems with cognitive processing, initially she presented with Abulia following the frontal stroke.  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  The patient is reporting difficulty with excessive daytime drowsiness, she snores quite a bit at night.  A sleep study was recommended, and neurocognitive rehab was recommended as well."    Epworth sleepiness score endorsed at 17/24 points, her BMI is morbidly obese at 41.5.  STUDY RESULTS:  Total Recording Time: 9 hours 1 minute, valid test time; 8 h 49 min Total Apnea/Hypopnea Index (AHI): 16.8/h; RDI: 18.5 /h. Average Oxygen Saturation:  93 %; Nadir Oxygen Saturation: 83 %.  Total Time Oxygen Saturation below 89 %:  11.0 minutes.  Average Heart Rate:  63 bpm (between 47 and 150 bpm). IMPRESSION: HST revealed mild-moderate sleep apnea of  obstructive type, associated with loud snoring. The HST did not show prolonged oxygen desaturation, but tachy -bradycardia.  RECOMMENDATION: CPAP would be the best therapy if cognitively able to use CPAP compliantly. Given her memory/cognitive disorder, I would like for this patient to have an attended CPAP titration, mask fitting and introduction to CPAP therapy and maintenance.   Apnea and snoring can be improved by weight loss.  I certify that I have reviewed the raw data recording prior to the issuance of this report in accordance with the standards of the American Academy of Sleep Medicine (AASM). Larey Seat, M.D.    05-02-2018    Medical Director of [facility], accredited by the AASM. Diplomat of the ABPN and ABSM.

## 2018-05-07 ENCOUNTER — Encounter: Payer: Self-pay | Admitting: Neurology

## 2018-05-08 ENCOUNTER — Encounter: Payer: Self-pay | Admitting: Family

## 2018-05-08 DIAGNOSIS — G4733 Obstructive sleep apnea (adult) (pediatric): Secondary | ICD-10-CM

## 2018-05-08 HISTORY — DX: Obstructive sleep apnea (adult) (pediatric): G47.33

## 2018-05-22 ENCOUNTER — Ambulatory Visit: Payer: Self-pay | Admitting: Family Medicine

## 2018-05-22 ENCOUNTER — Encounter: Payer: Self-pay | Admitting: Family Medicine

## 2018-05-22 VITALS — BP 104/68 | HR 78 | Temp 98.4°F | Resp 16 | Ht 64.0 in | Wt 227.0 lb

## 2018-05-22 DIAGNOSIS — M545 Low back pain, unspecified: Secondary | ICD-10-CM

## 2018-05-22 DIAGNOSIS — R569 Unspecified convulsions: Secondary | ICD-10-CM

## 2018-05-22 DIAGNOSIS — R7989 Other specified abnormal findings of blood chemistry: Secondary | ICD-10-CM

## 2018-05-22 DIAGNOSIS — E669 Obesity, unspecified: Secondary | ICD-10-CM

## 2018-05-22 DIAGNOSIS — Z7689 Persons encountering health services in other specified circumstances: Secondary | ICD-10-CM

## 2018-05-22 DIAGNOSIS — G8929 Other chronic pain: Secondary | ICD-10-CM

## 2018-05-22 DIAGNOSIS — I6930 Unspecified sequelae of cerebral infarction: Secondary | ICD-10-CM

## 2018-05-22 DIAGNOSIS — F3341 Major depressive disorder, recurrent, in partial remission: Secondary | ICD-10-CM

## 2018-05-22 MED ORDER — BACLOFEN 10 MG PO TABS: 5.0000 mg | ORAL_TABLET | Freq: Every evening | ORAL | 0 refills | Status: DC | PRN

## 2018-05-22 NOTE — Patient Instructions (Addendum)
Thank you for coming to the office today.  I am concerned about Kidney function as discussed. Elevated Creatinine number, we will re-check this again in future to determine if this is related to your symptoms in back.  Start taking Baclofen (Lioresal) 10mg  (muscle relaxant) - start with half (cut) to one whole pill at night as needed for next 1-3 nights (may make you drowsy, caution with driving) see how it affects you, then if tolerated increase to one pill 2 to 3 times a day or (every 8 hours as needed)  DUE for FASTING BLOOD WORK (no food or drink after midnight before the lab appointment, only water or coffee without cream/sugar on the morning of)  SCHEDULE "Lab Only" visit in the morning at the clinic for lab draw in 4-6 weeks  - Make sure Lab Only appointment is at about 1 week before your next appointment, so that results will be available  For Lab Results, once available within 2-3 days of blood draw, you can can log in to MyChart online to view your results and a brief explanation. Also, we can discuss results at next follow-up visit.   Please schedule a Follow-up Appointment to: Return in about 6 weeks (around 07/03/2018) for Annual Physical.  If you have any other questions or concerns, please feel free to call the office or send a message through [facility]. You may also schedule an earlier appointment if necessary.  Additionally, you may be receiving a survey about your experience at our office within a few days to 1 week by e-mail or mail. We value your feedback.  Nobie Putnam, DO [place]

## 2018-05-22 NOTE — Progress Notes (Signed)
Subjective:    Patient ID: [name], female    DOB: Mar 02, 1956, 62 y.o.   MRN: [id number]  [name] is a 62 y.o. female presenting on 05/22/2018 for Establish Care (lower back pain couple of months)  Patient is here to establish care with new PCP. Previously transferred from [facility] primary care. She is accompanied with husband, [name] (also my patient) he provides additional history   HPI   History of SAH / CVA with residual deficit / Memory Loss / OSA / Seizure Disorder Followed by [facility] also with sleep medicine - Background on onset problem 12/01/16, had acute episode, taken to [facility], dx [place] then taken to [facility]. Had CVA history of aneurysm. - Overall has normal motor symptoms, coordination and strength and ambulation intact - She had seizure as well and developed seizures x 3 after 1 month later, has been maintained on Keppra. Now takes 750mg  BID, makes her sleepy.  OSA - CPAP follow-up different mask, with sleep med thru [facility]  Back Pain, Chronic L sided Reports concern today with persistent LBP for past few weeks to months. Describes only at night, feels moderate sore achy pain localized, improve with rubbing and massage. Without radiation of symptoms, skin changes or rash. Prior OA/DJD of spine but has known arthritis other joints. - history of recurrent UTI, none recently - No nephrolithiasis - no hip or back pain  - Left hip replacement, and both knee arthritis  Elevated Creatinine Prior lab review showed history of AKI with hospitalized in 2018 and persistent elevated Cr, not normalized after. Last checked 04/2017. She was not aware of this problem. New discussion today. No longer taking NSAIDs, used to be on these years ago for long time. Limited water but significantly improved now - History of Hepatitis B in 1970s   Health Maintenance: Will review past records.  Depression screen [facility] 2/9 05/22/2018 01/08/2017  Decreased Interest 1 0  Down,  Depressed, Hopeless 0 0  PHQ - 2 Score 1 0  Altered sleeping 3 -  Tired, decreased energy 1 -  Change in appetite 1 -  Feeling bad or failure about yourself  1 -  Trouble concentrating 1 -  Moving slowly or fidgety/restless 1 -  Suicidal thoughts 0 -  PHQ-9 Score 9 -  Difficult doing work/chores Not difficult at all -   GAD 7 : Generalized Anxiety Score 05/22/2018  Nervous, Anxious, on Edge 1  Control/stop worrying 0  Worry too much - different things 0  Trouble relaxing 0  Restless 0  Easily annoyed or irritable 0  Afraid - awful might happen 1  Total GAD 7 Score 2  Anxiety Difficulty Not difficult at all    Past Medical History:  Diagnosis Date  . Anxiety   . Arthritis    left hip  . GERD (gastroesophageal reflux disease)   . Hepatitis 1972   hep B  . Memory disorder 09/21/2017  . OSA (obstructive sleep apnea) 05/08/2018  . PONV (postoperative nausea and vomiting)   . Seizures ([place])    Past Surgical History:  Procedure Laterality Date  . ABDOMINAL HYSTERECTOMY  04/1999  . ARTHROSCOPIC REPAIR ACL     bilateral knees  . BREAST EXCISIONAL BIOPSY Right 2008   NEG  . BREAST EXCISIONAL BIOPSY Left YRS AGO   X2 - NEG  . BREAST LUMPECTOMY  06/1979; 03/1995   left  . CHOLECYSTECTOMY  09/1987  . LAPAROSCOPIC GASTRIC SLEEVE RESECTION  09/30/2012  Procedure: LAPAROSCOPIC GASTRIC SLEEVE RESECTION;  Surgeon: Madilyn Hook, DO;  Location: [facility];  Service: General;  Laterality: N/A;  . TOTAL HIP ARTHROPLASTY  11/13/2012   left  . UPPER GI ENDOSCOPY  09/30/2012   Procedure: UPPER GI ENDOSCOPY;  Surgeon: Madilyn Hook, DO;  Location: [facility];  Service: General;;   Social History   Socioeconomic History  . Marital status: Married    Spouse name: Not on file  . Number of children: 0  . Years of education: BS  . Highest education level: Not on file  Occupational History  . Not on file  Social Needs  . Financial resource strain: Not on file  . Food insecurity:    Worry: Not  on file    Inability: Not on file  . Transportation needs:    Medical: Not on file    Non-medical: Not on file  Tobacco Use  . Smoking status: Former Smoker    Packs/day: 0.25    Years: 5.00    Pack years: 1.25    Types: Cigarettes    Last attempt to quit: 07/25/2001    Years since quitting: 16.8  . Smokeless tobacco: Former Network engineer and Sexual Activity  . Alcohol use: Yes    Alcohol/week: 0.0 standard drinks    Comment: 2 glasses of wine per week  . Drug use: No  . Sexual activity: Not on file  Lifestyle  . Physical activity:    Days per week: Not on file    Minutes per session: Not on file  . Stress: Not on file  Relationships  . Social connections:    Talks on phone: Not on file    Gets together: Not on file    Attends religious service: Not on file    Active member of club or organization: Not on file    Attends meetings of clubs or organizations: Not on file    Relationship status: Not on file  . Intimate partner violence:    Fear of current or ex partner: Not on file    Emotionally abused: Not on file    Physically abused: Not on file    Forced sexual activity: Not on file  Other Topics Concern  . Not on file  Social History Narrative   Lives with spouse   Caffeine use: Drinks coffee daily   Right handed   Family History  Problem Relation Age of Onset  . Hyperlipidemia Mother   . Obesity Mother   . Pulmonary fibrosis Mother   . Cancer Maternal Aunt        ovarian cancer  . Cancer Father   . Colon cancer Paternal Grandmother   . Esophageal cancer Neg Hx   . Stomach cancer Neg Hx   . Rectal cancer Neg Hx   . Breast cancer Neg Hx    Current Outpatient Medications on File Prior to Visit  Medication Sig  . acetaminophen (TYLENOL) 500 MG tablet Take 2 tablets (1,000 mg total) by mouth every 6 (six) hours as needed for mild pain or moderate pain.  Marland Kitchen aspirin EC 81 MG tablet Take 81 mg by mouth daily.  . B Complex Vitamins (VITAMIN-B COMPLEX PO) Take  by mouth daily.  Marland Kitchen FLUoxetine (PROZAC) 10 MG capsule Take 1 capsule (10 mg total) by mouth daily.  Marland Kitchen FLUoxetine (PROZAC) 20 MG capsule Take 1 capsule (20 mg total) by mouth daily.  Marland Kitchen levETIRAcetam (KEPPRA) 750 MG tablet Take 1 tablet (750 mg total) by mouth  2 (two) times daily.  . Multiple Vitamin (MULTIVITAMIN WITH MINERALS) TABS Take 1 tablet by mouth daily.   . Omega-3 Fatty Acids (FISH OIL) 1200 MG CAPS Take 1 capsule by mouth 2 (two) times daily.  Marland Kitchen VITAMIN D, CHOLECALCIFEROL, PO Take by mouth daily.   No current facility-administered medications on file prior to visit.     Review of Systems Per HPI unless specifically indicated above     Objective:    BP 104/68   Pulse 78   Temp 98.4 F (36.9 C) (Oral)   Resp 16   Ht 5\' 4"  (1.626 m)   Wt 227 lb (103 kg)   BMI 38.96 kg/m   Wt Readings from Last 3 Encounters:  05/22/18 227 lb (103 kg)  03/25/18 237 lb (107.5 kg)  10/25/17 242 lb (109.8 kg)    Physical Exam  Constitutional: She is oriented to person, place, and time. She appears well-developed and well-nourished. No distress.  Well-appearing, comfortable, cooperative  HENT:  Head: Normocephalic and atraumatic.  Mouth/Throat: Oropharynx is clear and moist.  Eyes: Conjunctivae are normal. Right eye exhibits no discharge. Left eye exhibits no discharge.  Neck: Normal range of motion. Neck supple. No thyromegaly present.  Cardiovascular: Normal rate, regular rhythm, normal heart sounds and intact distal pulses.  No murmur heard. Pulmonary/Chest: Effort normal and breath sounds normal. No respiratory distress. She has no wheezes. She has no rales.  Musculoskeletal: Normal range of motion. She exhibits no edema.  Low Back Inspection: Normal appearance, Large body habitus, no spinal deformity, symmetrical. No skin lesion Palpation: No tenderness over spinous processes. Bilateral lumbar paraspinal muscles non-tender and without hypertonicity/spasm. ROM: Full active ROM  forward flex / back extension, rotation L/R without discomfort Special Testing: Seated SLR negative for radicular pain bilaterally  Seated facet load test positive R with mild reproduced back pain Strength: Bilateral hip flex/ext 5/5, knee flex/ext 5/5, ankle dorsiflex/plantarflex 5/5 Neurovascular: intact distal sensation to light touch   Lymphadenopathy:    She has no cervical adenopathy.  Neurological: She is alert and oriented to person, place, and time.  Skin: Skin is warm and dry. No rash noted. She is not diaphoretic. No erythema.  Psychiatric: She has a normal mood and affect. Her behavior is normal.  Well groomed, good eye contact, normal speech and thoughts  Nursing note and vitals reviewed.  Results for orders placed or performed in visit on 09/11/17  Urine Culture  Result Value Ref Range   MICRO NUMBER: [id number]    SPECIMEN QUALITY: ADEQUATE    Sample Source NOT GIVEN    STATUS: FINAL    Result:      Single organism less than 10,000 CFU/mL isolated. These organisms, commonly found on external and internal genitalia, are considered colonizers. No further testing performed.  POCT Urinalysis Dipstick (Automated)  Result Value Ref Range   Color, UA orange    Clarity, UA hazy    Glucose, UA +    Bilirubin, UA +++    Ketones, UA +    Spec Grav, UA 1.025 1.010 - 1.025   Blood, UA neg    pH, UA 5.0 5.0 - 8.0   Protein, UA trace    Urobilinogen, UA 2.0 (A) 0.2 or 1.0 E.U./dL   Nitrite, UA positive    Leukocytes, UA Large (3+) (A) Negative      Assessment & Plan:   Problem List Items Addressed This Visit    History of hemorrhagic cerebrovascular accident (CVA) with residual deficit  Complicated history of prior [facility] and CVA. Now followed by [facility]rology, on ASA 81 Chronic deficit with memory loss and history of seizures after, on keppra    Obesity (BMI 35.0-39.9 without comorbidity) Encourage lifestyle improvement    Recurrent major depression in partial remission  ([place]) Controlled Stable on Fluoxetine 30mg  daily (20mg  + 10mg )    Seizures (HCC) Secondary to [facility] / CVA On Keppra 750mg  BID per Neuro - concern affecting her with more sedation, will return to Neuro to discuss dosing in future     Other Visit Diagnoses    Chronic left-sided low back pain without sciatica    -  Primary Unclear exact etiology of LBP, seems localized and triggered only at night seems MSK most likely, possibly related to likely OA/DJD of spine, risk factor with obesity - Less likely kidney related pain - Concern with elevated Cr on prior labs, now overdue for repeat  Plan Trial baclofen PRN muscle relaxant Follow-up    Relevant Medications   baclofen (LIORESAL) 10 MG tablet   Encounter to establish care with new doctor     Review outside [facility] in [facility]    Elevated serum creatinine     Check labs in 1 month - including Cr trend If elevated and abnormal still we can consider urine testing and referral to Nephrology for 2nd opinion and further management       Meds ordered this encounter  Medications  . baclofen (LIORESAL) 10 MG tablet    Sig: Take 0.5-1 tablets (5-10 mg total) by mouth at bedtime as needed for muscle spasms.    Dispense:  30 each    Refill:  0    Follow up plan: Return in about 6 weeks (around 07/03/2018) for Annual Physical.  Future labs ordered for 06/26/18  Nobie Putnam, [facility] 05/22/2018, 9:26 PM

## 2018-05-23 ENCOUNTER — Other Ambulatory Visit: Payer: Self-pay | Admitting: Family Medicine

## 2018-05-23 ENCOUNTER — Encounter: Payer: Self-pay | Admitting: Family Medicine

## 2018-05-23 DIAGNOSIS — E669 Obesity, unspecified: Secondary | ICD-10-CM

## 2018-05-23 DIAGNOSIS — R7309 Other abnormal glucose: Secondary | ICD-10-CM

## 2018-05-23 DIAGNOSIS — I6930 Unspecified sequelae of cerebral infarction: Secondary | ICD-10-CM

## 2018-05-23 DIAGNOSIS — F3341 Major depressive disorder, recurrent, in partial remission: Secondary | ICD-10-CM

## 2018-05-23 DIAGNOSIS — Z114 Encounter for screening for human immunodeficiency virus [HIV]: Secondary | ICD-10-CM

## 2018-05-23 DIAGNOSIS — Z1159 Encounter for screening for other viral diseases: Secondary | ICD-10-CM

## 2018-05-23 DIAGNOSIS — Z0000 Encounter for general adult medical examination without abnormal findings: Secondary | ICD-10-CM

## 2018-05-23 DIAGNOSIS — R413 Other amnesia: Secondary | ICD-10-CM

## 2018-05-23 DIAGNOSIS — E7800 Pure hypercholesterolemia, unspecified: Secondary | ICD-10-CM

## 2018-05-23 DIAGNOSIS — R569 Unspecified convulsions: Secondary | ICD-10-CM

## 2018-06-26 ENCOUNTER — Other Ambulatory Visit: Payer: Self-pay

## 2018-07-01 ENCOUNTER — Encounter: Payer: Self-pay | Admitting: Family Medicine

## 2018-08-08 ENCOUNTER — Ambulatory Visit: Payer: Self-pay | Admitting: Neurology

## 2018-09-25 ENCOUNTER — Encounter: Payer: Self-pay | Admitting: Adult Health

## 2018-09-25 ENCOUNTER — Ambulatory Visit: Payer: BLUE CROSS/BLUE SHIELD | Admitting: Adult Health

## 2018-09-25 VITALS — BP 113/77 | HR 87 | Ht 64.0 in | Wt 225.0 lb

## 2018-09-25 DIAGNOSIS — R569 Unspecified convulsions: Secondary | ICD-10-CM | POA: Diagnosis not present

## 2018-09-25 DIAGNOSIS — F4321 Adjustment disorder with depressed mood: Secondary | ICD-10-CM | POA: Diagnosis not present

## 2018-09-25 NOTE — Progress Notes (Signed)
PATIENT: [name] DOB: [birth year]  REASON FOR VISIT: follow up HISTORY FROM: patient  HISTORY OF PRESENT ILLNESS: Today 09/25/18:  [name] is a 62 year old female with a history of subarachnoid hemorrhage with subsequent seizures.  She returns today for follow-up.  She continues to do well on Keppra.  She is currently taking Keppra 750 mg twice a day.  She denies any seizures.  Denies any changes in her mood or behavior.  She did have a sleep study and AutoPap was recommended however she could not tolerate the CPAP so she turned her machine back in.  She reports that she continues to have fatigue during the day but it is manageable.  Reports that she typically takes a nap around 2 PM.  She returns today for evaluation.  HISTORY 03/25/18 [name] is a 62 year old female with a history of subarachnoid hemorrhage with subsequent seizures.  She returns today for follow-up.  She is currently on Keppra 750 mg twice a day.  She denies any seizure events.  She lives at home with her husband.  She is able to complete all ADLs independently.  She reports that she primarily stays home most of the time.  She remains on Prozac.  She was evaluated for sleep apnea.  A sleep study was ordered but the patient has not scheduled this.  She returns today for evaluation.  REVIEW OF SYSTEMS: Out of a complete 14 system review of symptoms, the patient complains only of the following symptoms, and all other reviewed systems are negative.  Memory loss  ALLERGIES: No Known Allergies  HOME MEDICATIONS: Outpatient Medications Prior to Visit  Medication Sig Dispense Refill  . acetaminophen (TYLENOL) 500 MG tablet Take 2 tablets (1,000 mg total) by mouth every 6 (six) hours as needed for mild pain or moderate pain. 30 tablet 0  . aspirin EC 81 MG tablet Take 81 mg by mouth daily.    . B Complex Vitamins (VITAMIN-B COMPLEX PO) Take by mouth daily.    Marland Kitchen FLUoxetine (PROZAC) 10 MG capsule Take 1  capsule (10 mg total) by mouth daily. 90 capsule 3  . FLUoxetine (PROZAC) 20 MG capsule Take 1 capsule (20 mg total) by mouth daily. 90 capsule 3  . levETIRAcetam (KEPPRA) 750 MG tablet Take 1 tablet (750 mg total) by mouth 2 (two) times daily. 180 tablet 3  . Multiple Vitamin (MULTIVITAMIN WITH MINERALS) TABS Take 1 tablet by mouth daily.     . Omega-3 Fatty Acids (FISH OIL) 1200 MG CAPS Take 1 capsule by mouth 2 (two) times daily.    Marland Kitchen VITAMIN D, CHOLECALCIFEROL, PO Take by mouth daily.    . baclofen (LIORESAL) 10 MG tablet Take 0.5-1 tablets (5-10 mg total) by mouth at bedtime as needed for muscle spasms. (Patient not taking: Reported on 09/25/2018) 30 each 0   No facility-administered medications prior to visit.     PAST MEDICAL HISTORY: Past Medical History:  Diagnosis Date  . Anxiety   . Arthritis    left hip  . GERD (gastroesophageal reflux disease)   . Hepatitis 1972   hep B  . Memory disorder 09/21/2017  . OSA (obstructive sleep apnea) 05/08/2018  . PONV (postoperative nausea and vomiting)   . Seizures ([place])     PAST SURGICAL HISTORY: Past Surgical History:  Procedure Laterality Date  . ABDOMINAL HYSTERECTOMY  04/1999  . ARTHROSCOPIC REPAIR ACL     bilateral knees  . BREAST EXCISIONAL BIOPSY Right 2008  NEG  . BREAST EXCISIONAL BIOPSY Left YRS AGO   X2 - NEG  . BREAST LUMPECTOMY  06/1979; 03/1995   left  . CHOLECYSTECTOMY  09/1987  . LAPAROSCOPIC GASTRIC SLEEVE RESECTION  09/30/2012   Procedure: LAPAROSCOPIC GASTRIC SLEEVE RESECTION;  Surgeon: Madilyn Hook, DO;  Location[facility];  Service: General;  Laterality: N/A;  . TOTAL HIP ARTHROPLASTY  11/13/2012   left  . UPPER GI ENDOSCOPY  09/30/2012   Procedure: UPPER GI ENDOSCOPY;  Surgeon: Madilyn Hook, DO;  Location[facility];  Service: General;;    FAMILY HISTORY: Family History  Problem Relation Age of Onset  . Hyperlipidemia Mother   . Obesity Mother   . Pulmonary fibrosis Mother   . Cancer Maternal Aunt         ovarian cancer  . Cancer Father   . Colon cancer Paternal Grandmother   . Esophageal cancer Neg Hx   . Stomach cancer Neg Hx   . Rectal cancer Neg Hx   . Breast cancer Neg Hx     SOCIAL HISTORY: Social History   Socioeconomic History  . Marital status: Married    Spouse name: Not on file  . Number of children: 0  . Years of education: BS  . Highest education level: Not on file  Occupational History  . Not on file  Social Needs  . Financial resource strain: Not on file  . Food insecurity:    Worry: Not on file    Inability: Not on file  . Transportation needs:    Medical: Not on file    Non-medical: Not on file  Tobacco Use  . Smoking status: Former Smoker    Packs/day: 0.25    Years: 5.00    Pack years: 1.25    Types: Cigarettes    Last attempt to quit: 07/25/2001    Years since quitting: 17.1  . Smokeless tobacco: Former Network engineer and Sexual Activity  . Alcohol use: Yes    Alcohol/week: 0.0 standard drinks    Comment: 2 glasses of wine per week  . Drug use: No  . Sexual activity: Not on file  Lifestyle  . Physical activity:    Days per week: Not on file    Minutes per session: Not on file  . Stress: Not on file  Relationships  . Social connections:    Talks on phone: Not on file    Gets together: Not on file    Attends religious service: Not on file    Active member of club or organization: Not on file    Attends meetings of clubs or organizations: Not on file    Relationship status: Not on file  . Intimate partner violence:    Fear of current or ex partner: Not on file    Emotionally abused: Not on file    Physically abused: Not on file    Forced sexual activity: Not on file  Other Topics Concern  . Not on file  Social History Narrative   Lives with spouse   Caffeine use: Drinks coffee daily   Right handed      PHYSICAL EXAM  Vitals:   09/25/18 0747  BP: 113/77  Pulse: 87  Weight: 225 lb (102.1 kg)  Height: 5\' 4"  (1.626 m)   Body  mass index is 38.62 kg/m.  Generalized: Well developed, in no acute distress   Neurological examination  Mentation: Alert oriented to time, place, history taking. Follows all commands speech and language fluent  Cranial nerve II-XII: Pupils were equal round reactive to light. Extraocular movements were full, visual field were full on confrontational test. Facial sensation and strength were normal. Uvula tongue midline. Head turning and shoulder shrug  were normal and symmetric. Motor: The motor testing reveals 5 over 5 strength of all 4 extremities. Good symmetric motor tone is noted throughout.  Sensory: Sensory testing is intact to soft touch on all 4 extremities. No evidence of extinction is noted.  Coordination: Cerebellar testing reveals good finger-nose-finger and heel-to-shin bilaterally.  Gait and station: Gait is normal. Reflexes: Deep tendon reflexes are symmetric and normal bilaterally.   DIAGNOSTIC DATA (LABS, IMAGING, TESTING) - I reviewed patient records, labs, notes, testing and imaging myself where available.  Lab Results  Component Value Date   WBC 7.8 04/08/2017   HGB 12.4 04/08/2017   HCT 35.9 04/08/2017   MCV 91.2 04/08/2017   PLT 312 04/08/2017      Component Value Date/Time   NA 140 04/08/2017 1334   NA 140 11/18/2012 0530   K 3.9 04/08/2017 1334   K 4.0 11/18/2012 0530   CL 108 04/08/2017 1334   CL 108 (H) 11/18/2012 0530   CO2 27 04/08/2017 1334   CO2 27 11/18/2012 0530   GLUCOSE 96 04/08/2017 1334   GLUCOSE 95 11/18/2012 0530   BUN 16 04/08/2017 1334   BUN 8 11/18/2012 0530   CREATININE 1.43 (H) 04/08/2017 1334   CREATININE 0.62 11/18/2012 0530   CREATININE 1.01 08/02/2012 1144   CALCIUM 9.0 04/08/2017 1334   CALCIUM 7.9 (L) 11/18/2012 0530   PROT 6.8 01/08/2017 1147   ALBUMIN 3.7 01/08/2017 1147   AST 19 01/08/2017 1147   ALT 29 01/08/2017 1147   ALKPHOS 88 01/08/2017 1147   BILITOT 0.4 01/08/2017 1147   GFRNONAA 39 (L) 04/08/2017 1334    GFRNONAA >60 11/18/2012 0530   GFRAA 45 (L) 04/08/2017 1334   GFRAA >60 11/18/2012 0530   Lab Results  Component Value Date   CHOL 182 03/31/2014   HDL 53.60 03/31/2014   LDLCALC 112 (H) 03/31/2014   LDLDIRECT 146.6 12/19/2012   TRIG 80.0 03/31/2014   CHOLHDL 3 03/31/2014   No results found for: HGBA1C Lab Results  Component Value Date   VITAMINB12 >1500 (H) 03/31/2014   Lab Results  Component Value Date   TSH 2.13 03/31/2014      ASSESSMENT AND PLAN 62 y.o. year old female  has a past medical history of Anxiety, Arthritis, GERD (gastroesophageal reflux disease), Hepatitis (1972), Memory disorder (09/21/2017), OSA (obstructive sleep apnea) (05/08/2018), PONV (postoperative nausea and vomiting), and Seizures ([place]). here with:  1.  Seizures 2. Depression/anxiety   Overall the patient has done well.  She will continue on Keppra 750 mg twice a day.  She will continue on Prozac.  She is advised that if her symptoms worsen or she develops new symptoms she should let us know.  She will follow-up in 1 year or sooner if needed.   Ward Givens, MSN, NP-C 09/25/2018, 8:18 AM [facility] [address], [place], [place] [address] [phone]

## 2018-09-25 NOTE — Progress Notes (Signed)
I have read the note, and I agree with the clinical assessment and plan.  Zohal Reny K Amanda Steuart   

## 2018-09-25 NOTE — Patient Instructions (Addendum)
Your Plan:  Continue Keppra Continue Prozac If your symptoms worsen or you develop new symptoms please let us know.    Thank you for coming to see [place] at [facility]. I hope we have been able to provide you high quality care today.  You may receive a patient satisfaction survey over the next few weeks. We would appreciate your feedback and comments so that we may continue to improve ourselves and the health of our patients.

## 2018-09-26 DIAGNOSIS — Z0289 Encounter for other administrative examinations: Secondary | ICD-10-CM

## 2018-10-21 ENCOUNTER — Telehealth: Payer: Self-pay | Admitting: *Deleted

## 2018-10-21 NOTE — Telephone Encounter (Signed)
Form completed and signed.  To MR.

## 2018-10-24 ENCOUNTER — Telehealth: Payer: Self-pay | Admitting: *Deleted

## 2018-10-24 NOTE — Telephone Encounter (Signed)
I faxed pt form to [facility].

## 2019-01-20 ENCOUNTER — Other Ambulatory Visit: Payer: Self-pay | Admitting: Adult Health

## 2019-01-20 DIAGNOSIS — F329 Major depressive disorder, single episode, unspecified: Secondary | ICD-10-CM

## 2019-01-20 DIAGNOSIS — F32A Depression, unspecified: Secondary | ICD-10-CM

## 2019-04-22 ENCOUNTER — Other Ambulatory Visit: Payer: Self-pay | Admitting: Adult Health

## 2019-06-17 ENCOUNTER — Other Ambulatory Visit: Payer: Self-pay | Admitting: Family Medicine

## 2019-06-17 DIAGNOSIS — Z114 Encounter for screening for human immunodeficiency virus [HIV]: Secondary | ICD-10-CM

## 2019-06-17 DIAGNOSIS — R7309 Other abnormal glucose: Secondary | ICD-10-CM

## 2019-06-17 DIAGNOSIS — E669 Obesity, unspecified: Secondary | ICD-10-CM

## 2019-06-17 DIAGNOSIS — R569 Unspecified convulsions: Secondary | ICD-10-CM

## 2019-06-17 DIAGNOSIS — I6930 Unspecified sequelae of cerebral infarction: Secondary | ICD-10-CM

## 2019-06-17 DIAGNOSIS — Z1159 Encounter for screening for other viral diseases: Secondary | ICD-10-CM

## 2019-06-17 DIAGNOSIS — E7800 Pure hypercholesterolemia, unspecified: Secondary | ICD-10-CM

## 2019-06-17 DIAGNOSIS — Z0000 Encounter for general adult medical examination without abnormal findings: Secondary | ICD-10-CM

## 2019-06-18 ENCOUNTER — Other Ambulatory Visit: Payer: BLUE CROSS/BLUE SHIELD

## 2019-06-18 ENCOUNTER — Other Ambulatory Visit: Payer: Self-pay

## 2019-06-18 DIAGNOSIS — Z0000 Encounter for general adult medical examination without abnormal findings: Secondary | ICD-10-CM | POA: Diagnosis not present

## 2019-06-18 DIAGNOSIS — E669 Obesity, unspecified: Secondary | ICD-10-CM | POA: Diagnosis not present

## 2019-06-18 DIAGNOSIS — Z114 Encounter for screening for human immunodeficiency virus [HIV]: Secondary | ICD-10-CM | POA: Diagnosis not present

## 2019-06-18 DIAGNOSIS — E7800 Pure hypercholesterolemia, unspecified: Secondary | ICD-10-CM | POA: Diagnosis not present

## 2019-06-18 DIAGNOSIS — R7309 Other abnormal glucose: Secondary | ICD-10-CM | POA: Diagnosis not present

## 2019-06-18 DIAGNOSIS — Z1159 Encounter for screening for other viral diseases: Secondary | ICD-10-CM | POA: Diagnosis not present

## 2019-06-19 LAB — LIPID PANEL
Cholesterol: 276 mg/dL — ABNORMAL HIGH (ref <200)
HDL: 63 mg/dL (ref >=50)
LDL Cholesterol (Calc): 187 mg/dL (calc) — ABNORMAL HIGH
Non-HDL Cholesterol (Calc): 213 mg/dL (calc) — ABNORMAL HIGH (ref <130)
Total CHOL/HDL Ratio: 4.4 (calc) (ref <5.0)
Triglycerides: 127 mg/dL (ref <150)

## 2019-06-19 LAB — COMPREHENSIVE METABOLIC PANEL
AG Ratio: 1.4 (calc) (ref 1.0–2.5)
ALT: 11 U/L (ref 6–29)
AST: 14 U/L (ref 10–35)
Albumin: 3.6 g/dL (ref 3.6–5.1)
Alkaline phosphatase (APISO): 67 U/L (ref 37–153)
BUN/Creatinine Ratio: 13 (calc) (ref 6–22)
BUN: 15 mg/dL (ref 7–25)
CO2: 28 mmol/L (ref 20–32)
Calcium: 9.4 mg/dL (ref 8.6–10.4)
Chloride: 109 mmol/L (ref 98–110)
Creat: 1.14 mg/dL — ABNORMAL HIGH (ref 0.50–0.99)
Globulin: 2.6 g/dL (calc) (ref 1.9–3.7)
Glucose, Bld: 88 mg/dL (ref 65–99)
Potassium: 5.2 mmol/L (ref 3.5–5.3)
Sodium: 144 mmol/L (ref 135–146)
Total Bilirubin: 0.5 mg/dL (ref 0.2–1.2)
Total Protein: 6.2 g/dL (ref 6.1–8.1)

## 2019-06-19 LAB — CBC WITH DIFFERENTIAL/PLATELET
Absolute Monocytes: 442 cells/uL (ref 200–950)
Basophils Absolute: 103 cells/uL (ref 0–200)
Basophils Relative: 1.3 %
Eosinophils Absolute: 529 cells/uL — ABNORMAL HIGH (ref 15–500)
Eosinophils Relative: 6.7 %
HCT: 39 % (ref 35.0–45.0)
Hemoglobin: 12.9 g/dL (ref 11.7–15.5)
Lymphs Abs: 3286 cells/uL (ref 850–3900)
MCH: 31.6 pg (ref 27.0–33.0)
MCHC: 33.1 g/dL (ref 32.0–36.0)
MCV: 95.6 fL (ref 80.0–100.0)
MPV: 9.3 fL (ref 7.5–12.5)
Monocytes Relative: 5.6 %
Neutro Abs: 3539 cells/uL (ref 1500–7800)
Neutrophils Relative %: 44.8 %
Platelets: 325 10*3/uL (ref 140–400)
RBC: 4.08 10*6/uL (ref 3.80–5.10)
RDW: 11.8 % (ref 11.0–15.0)
Total Lymphocyte: 41.6 %
WBC: 7.9 10*3/uL (ref 3.8–10.8)

## 2019-06-19 LAB — T4, FREE: Free T4: 1 ng/dL (ref 0.8–1.8)

## 2019-06-19 LAB — HEPATITIS C ANTIBODY
Hepatitis C Ab: NONREACTIVE
SIGNAL TO CUT-OFF: 0.01 (ref <1.00)

## 2019-06-19 LAB — TSH: TSH: 2.14 mIU/L (ref 0.40–4.50)

## 2019-06-19 LAB — HEMOGLOBIN A1C
Hgb A1c MFr Bld: 5 % of total Hgb (ref <5.7)
Mean Plasma Glucose: 97 (calc)
eAG (mmol/L): 5.4 (calc)

## 2019-06-19 LAB — HIV ANTIBODY (ROUTINE TESTING W REFLEX): HIV 1&2 Ab, 4th Generation: NONREACTIVE

## 2019-06-20 ENCOUNTER — Encounter: Payer: Self-pay | Admitting: Family Medicine

## 2019-06-20 ENCOUNTER — Other Ambulatory Visit: Payer: Self-pay

## 2019-06-20 ENCOUNTER — Other Ambulatory Visit: Payer: Self-pay | Admitting: Family Medicine

## 2019-06-20 ENCOUNTER — Ambulatory Visit (INDEPENDENT_AMBULATORY_CARE_PROVIDER_SITE_OTHER): Payer: Medicare HMO | Admitting: Family Medicine

## 2019-06-20 VITALS — BP 101/87 | HR 86 | Temp 98.5°F | Resp 16 | Ht 64.0 in | Wt 229.0 lb

## 2019-06-20 DIAGNOSIS — E7800 Pure hypercholesterolemia, unspecified: Secondary | ICD-10-CM | POA: Diagnosis not present

## 2019-06-20 DIAGNOSIS — G4733 Obstructive sleep apnea (adult) (pediatric): Secondary | ICD-10-CM

## 2019-06-20 DIAGNOSIS — N182 Chronic kidney disease, stage 2 (mild): Secondary | ICD-10-CM | POA: Diagnosis not present

## 2019-06-20 DIAGNOSIS — E669 Obesity, unspecified: Secondary | ICD-10-CM | POA: Diagnosis not present

## 2019-06-20 DIAGNOSIS — R569 Unspecified convulsions: Secondary | ICD-10-CM

## 2019-06-20 DIAGNOSIS — Z0000 Encounter for general adult medical examination without abnormal findings: Secondary | ICD-10-CM

## 2019-06-20 DIAGNOSIS — Z23 Encounter for immunization: Secondary | ICD-10-CM | POA: Diagnosis not present

## 2019-06-20 DIAGNOSIS — F3341 Major depressive disorder, recurrent, in partial remission: Secondary | ICD-10-CM

## 2019-06-20 DIAGNOSIS — Z1239 Encounter for other screening for malignant neoplasm of breast: Secondary | ICD-10-CM | POA: Diagnosis not present

## 2019-06-20 DIAGNOSIS — Z0100 Encounter for examination of eyes and vision without abnormal findings: Secondary | ICD-10-CM | POA: Diagnosis not present

## 2019-06-20 MED ORDER — ROSUVASTATIN CALCIUM 10 MG PO TABS: 10.0000 mg | ORAL_TABLET | Freq: Every day | ORAL | 3 refills | Status: DC

## 2019-06-20 NOTE — Assessment & Plan Note (Signed)
No breakthrough seizures Followed by [facility] Complication from [facility] CVA in past On Keppra May switch Neuro locally to [facility] Neuro in future, notify office

## 2019-06-20 NOTE — Progress Notes (Signed)
Subjective:    Patient ID: [name], female    DOB: 07/15/1956, 63 y.o.   MRN: [id number]  [name] is a 63 y.o. female presenting on 06/20/2019 for Annual Exam   HPI   Here for Annual Physical and Lab Review.  Elevated Creatinine / CKD-II No history of hypertension. Prior review had mild AKI in past due to hospitalization. Now repeat lab shows improved Cr to 1.14, near baseline presumed. No new concerns. Not on NSAID. Tries to improve hydration  HYPERLIPIDEMIA: Last lipid panel 06/2019, elevated LDL 187, prior results 140-180 as well. Normal HDL TG - Never on Statin cholesterol med. - She has history of hemorrhagic stroke, not ischemic stroke and no MI - She takes ASA 81mg  daily with minor bruise symptoms but no bleeding complication by report  History of SAH / CVA with residual deficit / Memory Loss / OSA / Seizure Disorder Followed by [facility] Neurology also with sleep medicine - Background on onset problem 12/01/16, had acute episode, taken to [facility], dx Power then taken to [facility] Had CVA history of aneurysm. Had complication with seizure. - Overall has normal motor symptoms, coordination and strength and ambulation intact - Continues on Keppra 750 BID, no further seizures - She would like to switch to local Neurology for her follow-up in future, not quite ready yet  Major Depression, recurrent in partial remission Insomnia or sleep disturbance - occasional difficulty falling back asleep if wake up overnight, sometimes uses phone or device or email then will try to go back to bed. Admits some anxiety at times. But seems well controlled on current med, Fluoxetine.  OSA no longer on CPAP, ineffective.  Additional complaint  GERD / Indigestion Admits rare episodic problem, worse with trigger foods tomatoes etc. Not taking OTC antacid or H2 blocker PPI based on history. Rare nausea vomiting.   Health Maintenance:  Colon CA Screening: Last Colonoscopy (done by  [facility]), results with polyps benign, repeat in 5 years. Currently asymptomatic. Known fam history of colon CA paternal grandmother. Due in 2021 - consider cologuard vs colonoscopy  Cervical CA Screening - s/p total hysterectomy. Not due for further pap smears.  Due for Flu Shot, will receive today   Breast CA Screening: Due for mammogram screening. Last mammogram result 02/22/27 (negative) done at [facility]. No prior history abnormal mammogram. No known family history of breast cancer. Currently asymptomatic. Request order.   Depression screen [facility] 2/9 06/20/2019 05/22/2018 01/08/2017  Decreased Interest 0 1 0  Down, Depressed, Hopeless 0 0 0  PHQ - 2 Score 0 1 0  Altered sleeping 1 3 -  Tired, decreased energy 1 1 -  Change in appetite 0 1 -  Feeling bad or failure about yourself  0 1 -  Trouble concentrating 0 1 -  Moving slowly or fidgety/restless 0 1 -  Suicidal thoughts 0 0 -  PHQ-9 Score 2 9 -  Difficult doing work/chores Not difficult at all Not difficult at all -   GAD 7 : Generalized Anxiety Score 05/22/2018  Nervous, Anxious, on Edge 1  Control/stop worrying 0  Worry too much - different things 0  Trouble relaxing 0  Restless 0  Easily annoyed or irritable 0  Afraid - awful might happen 1  Total GAD 7 Score 2  Anxiety Difficulty Not difficult at all     Past Medical History:  Diagnosis Date  . Anxiety   . Arthritis    left hip  .  GERD (gastroesophageal reflux disease)   . Hepatitis 1972   hep B  . Memory disorder 09/21/2017  . OSA (obstructive sleep apnea) 05/08/2018  . PONV (postoperative nausea and vomiting)   . Seizures ([place])    Past Surgical History:  Procedure Laterality Date  . ABDOMINAL HYSTERECTOMY  04/1999  . ARTHROSCOPIC REPAIR ACL     bilateral knees  . BREAST EXCISIONAL BIOPSY Right 2008   NEG  . BREAST EXCISIONAL BIOPSY Left YRS AGO   X2 - NEG  . BREAST LUMPECTOMY  06/1979; 03/1995   left  . CHOLECYSTECTOMY  09/1987  .  LAPAROSCOPIC GASTRIC SLEEVE RESECTION  09/30/2012   Procedure: LAPAROSCOPIC GASTRIC SLEEVE RESECTION;  Surgeon: Madilyn Hook, DO;  Location: [facility];  Service: General;  Laterality: N/A;  . TOTAL HIP ARTHROPLASTY  11/13/2012   left  . UPPER GI ENDOSCOPY  09/30/2012   Procedure: UPPER GI ENDOSCOPY;  Surgeon: Madilyn Hook, DO;  Location: [facility];  Service: General;;   Social History   Socioeconomic History  . Marital status: Married    Spouse name: Not on file  . Number of children: 0  . Years of education: BS  . Highest education level: Not on file  Occupational History  . Not on file  Social Needs  . Financial resource strain: Not on file  . Food insecurity    Worry: Not on file    Inability: Not on file  . Transportation needs    Medical: Not on file    Non-medical: Not on file  Tobacco Use  . Smoking status: Former Smoker    Packs/day: 0.25    Years: 5.00    Pack years: 1.25    Types: Cigarettes    Quit date: 07/25/2001    Years since quitting: 17.9  . Smokeless tobacco: Former Network engineer and Sexual Activity  . Alcohol use: Yes    Alcohol/week: 0.0 standard drinks    Comment: 2 glasses of wine per week  . Drug use: No  . Sexual activity: Not on file  Lifestyle  . Physical activity    Days per week: Not on file    Minutes per session: Not on file  . Stress: Not on file  Relationships  . Social Herbalist on phone: Not on file    Gets together: Not on file    Attends religious service: Not on file    Active member of club or organization: Not on file    Attends meetings of clubs or organizations: Not on file    Relationship status: Not on file  . Intimate partner violence    Fear of current or ex partner: Not on file    Emotionally abused: Not on file    Physically abused: Not on file    Forced sexual activity: Not on file  Other Topics Concern  . Not on file  Social History Narrative   Lives with spouse   Caffeine use: Drinks coffee daily    Right handed   Family History  Problem Relation Age of Onset  . Hyperlipidemia Mother   . Obesity Mother   . Pulmonary fibrosis Mother   . Cancer Maternal Aunt        ovarian cancer  . Cancer Father   . Colon cancer Paternal Grandmother   . Esophageal cancer Neg Hx   . Stomach cancer Neg Hx   . Rectal cancer Neg Hx   . Breast cancer Neg Hx  Current Outpatient Medications on File Prior to Visit  Medication Sig  . acetaminophen (TYLENOL) 500 MG tablet Take 2 tablets (1,000 mg total) by mouth every 6 (six) hours as needed for mild pain or moderate pain.  Marland Kitchen aspirin EC 81 MG tablet Take 81 mg by mouth daily.  . B Complex Vitamins (VITAMIN-B COMPLEX PO) Take by mouth daily.  Marland Kitchen FLUoxetine (PROZAC) 10 MG capsule TAKE 1 CAPSULE BY MOUTH EVERY DAY  . FLUoxetine (PROZAC) 20 MG capsule TAKE 1 CAPSULE BY MOUTH EVERY DAY  . levETIRAcetam (KEPPRA) 750 MG tablet TAKE ONE TABLET BY MOUTH TWICE DAILY  . Melatonin 3 MG TABS Take 6 mg by mouth at bedtime.  . Multiple Vitamin (MULTIVITAMIN WITH MINERALS) TABS Take 1 tablet by mouth daily.   . Omega-3 Fatty Acids (FISH OIL) 1200 MG CAPS Take 1 capsule by mouth 2 (two) times daily.  Marland Kitchen VITAMIN D, CHOLECALCIFEROL, PO Take by mouth daily.   No current facility-administered medications on file prior to visit.     Review of Systems  Constitutional: Negative for activity change, appetite change, chills, diaphoresis, fatigue and fever.  HENT: Negative for congestion and hearing loss.   Eyes: Negative for visual disturbance.  Respiratory: Negative for apnea, cough, chest tightness, shortness of breath and wheezing.   Cardiovascular: Negative for chest pain, palpitations and leg swelling.  Gastrointestinal: Negative for abdominal pain, anal bleeding, blood in stool, constipation, diarrhea, nausea and vomiting.  Endocrine: Negative for cold intolerance.  Genitourinary: Negative for difficulty urinating, dysuria, frequency, hematuria and urgency.   Musculoskeletal: Negative for arthralgias and neck pain.  Skin: Negative for rash.  Allergic/Immunologic: Negative for environmental allergies.  Neurological: Negative for dizziness, weakness, light-headedness, numbness and headaches.  Hematological: Negative for adenopathy.  Psychiatric/Behavioral: Positive for sleep disturbance. Negative for behavioral problems and dysphoric mood.   Per HPI unless specifically indicated above      Objective:    BP 101/87   Pulse 86   Temp 98.5 F (36.9 C) (Oral)   Resp 16   Ht 5\' 4"  (1.626 m)   Wt 229 lb (103.9 kg)   BMI 39.31 kg/m   Wt Readings from Last 3 Encounters:  06/20/19 229 lb (103.9 kg)  09/25/18 225 lb (102.1 kg)  05/22/18 227 lb (103 kg)    Physical Exam Vitals signs and nursing note reviewed.  Constitutional:      General: She is not in acute distress.    Appearance: She is well-developed. She is not diaphoretic.     Comments: Well-appearing, comfortable, cooperative, obese  HENT:     Head: Normocephalic and atraumatic.     Right Ear: Tympanic membrane, ear canal and external ear normal.     Left Ear: Tympanic membrane, ear canal and external ear normal.  Eyes:     General:        Right eye: No discharge.        Left eye: No discharge.     Conjunctiva/sclera: Conjunctivae normal.     Pupils: Pupils are equal, round, and reactive to light.  Neck:     Musculoskeletal: Normal range of motion and neck supple.     Thyroid: No thyromegaly.     Comments: No carotid bruit Cardiovascular:     Rate and Rhythm: Normal rate and regular rhythm.     Heart sounds: Normal heart sounds. No murmur.  Pulmonary:     Effort: Pulmonary effort is normal. No respiratory distress.     Breath sounds: Normal breath  sounds. No wheezing or rales.  Abdominal:     General: Bowel sounds are normal. There is no distension.     Palpations: Abdomen is soft. There is no mass.     Tenderness: There is no abdominal tenderness.  Musculoskeletal:  Normal range of motion.        General: No tenderness.     Comments: Upper / Lower Extremities: - Normal muscle tone, strength bilateral upper extremities 5/5, lower extremities 5/5  Lymphadenopathy:     Cervical: No cervical adenopathy.  Skin:    General: Skin is warm and dry.     Findings: No erythema or rash.  Neurological:     Mental Status: She is alert and oriented to person, place, and time.     Comments: Distal sensation intact to light touch all extremities  Psychiatric:        Behavior: Behavior normal.     Comments: Well groomed, good eye contact, normal speech and thoughts       Results for orders placed or performed in visit on 06/17/19  CBC with Differential  Result Value Ref Range   WBC 7.9 3.8 - 10.8 Thousand/uL   RBC 4.08 3.80 - 5.10 Million/uL   Hemoglobin 12.9 11.7 - 15.5 g/dL   HCT 39.0 35.0 - 45.0 %   MCV 95.6 80.0 - 100.0 fL   MCH 31.6 27.0 - 33.0 pg   MCHC 33.1 32.0 - 36.0 g/dL   RDW 11.8 11.0 - 15.0 %   Platelets 325 140 - 400 Thousand/uL   MPV 9.3 7.5 - 12.5 fL   Neutro Abs 3,539 1,500 - 7,800 cells/uL   Lymphs Abs 3,286 850 - 3,900 cells/uL   Absolute Monocytes 442 200 - 950 cells/uL   Eosinophils Absolute 529 (H) 15 - 500 cells/uL   Basophils Absolute 103 0 - 200 cells/uL   Neutrophils Relative % 44.8 %   Total Lymphocyte 41.6 %   Monocytes Relative 5.6 %   Eosinophils Relative 6.7 %   Basophils Relative 1.3 %  Lipid Profile  Result Value Ref Range   Cholesterol 276 (H) <200 mg/dL   HDL 63 > OR = 50 mg/dL   Triglycerides 127 <150 mg/dL   LDL Cholesterol (Calc) 187 (H) mg/dL (calc)   Total CHOL/HDL Ratio 4.4 <5.0 (calc)   Non-HDL Cholesterol (Calc) 213 (H) <130 mg/dL (calc)  Comprehensive Metabolic Panel (CMET)  Result Value Ref Range   Glucose, Bld 88 65 - 99 mg/dL   BUN 15 7 - 25 mg/dL   Creat 1.14 (H) 0.50 - 0.99 mg/dL   BUN/Creatinine Ratio 13 6 - 22 (calc)   Sodium 144 135 - 146 mmol/L   Potassium 5.2 3.5 - 5.3 mmol/L   Chloride  109 98 - 110 mmol/L   CO2 28 20 - 32 mmol/L   Calcium 9.4 8.6 - 10.4 mg/dL   Total Protein 6.2 6.1 - 8.1 g/dL   Albumin 3.6 3.6 - 5.1 g/dL   Globulin 2.6 1.9 - 3.7 g/dL (calc)   AG Ratio 1.4 1.0 - 2.5 (calc)   Total Bilirubin 0.5 0.2 - 1.2 mg/dL   Alkaline phosphatase (APISO) 67 37 - 153 U/L   AST 14 10 - 35 U/L   ALT 11 6 - 29 U/L  Hemoglobin A1c  Result Value Ref Range   Hgb A1c MFr Bld 5.0 <5.7 % of total Hgb   Mean Plasma Glucose 97 (calc)   eAG (mmol/L) 5.4 (calc)  TSH  Result Value  Ref Range   TSH 2.14 0.40 - 4.50 mIU/L  T4, free  Result Value Ref Range   Free T4 1.0 0.8 - 1.8 ng/dL  Hepatitis C antibody  Result Value Ref Range   Hepatitis C Ab NON-REACTIVE NON-REACTI   SIGNAL TO CUT-OFF 0.01 <1.00  HIV Antibody (routine testing w rflx)  Result Value Ref Range   HIV 1&2 Ab, 4th Generation NON-REACTIVE NON-REACTI      Assessment & Plan:   Problem List Items Addressed This Visit    CKD (chronic kidney disease), stage II    Improved Cr on last lab 1.14, likely baseline Improved from 2 yr ago likely was AKI Improve hydration No HTN or DM      Hyperlipidemia    Uncontrolled cholesterol LDL Last lipid panel 06/2019 Calculated ASCVD 10 yr risk score 3.3% low risk  Plan: 1. START Rosuvastatin 10mg  nightly - counseling on medication benefit risk prevention of ASCVD potential side effect 2. Continue ASA 81mg  for primary ASCVD risk reduction - future can reduce med if on statin 3. Encourage improved lifestyle - low carb/cholesterol, reduce portion size, continue improving regular exercise 4. Follow-up 3 months lab CMET Lipid adjust med       Relevant Medications   rosuvastatin (CRESTOR) 10 MG tablet   Obesity (BMI 35.0-39.9 without comorbidity)    Encourage weight loss lifestyle diet exercise      OSA (obstructive sleep apnea)    Previous followed Pulm No longer on CPAP ineffective      Recurrent major depression in partial remission (HCC)    Controlled,  stable major depression in partial remission See PHQ On fluoxetine 30mg  daily, no change Some insomnia, discussed per AVS Sleep Hygiene, adjust melatonin      Screening for breast cancer   Relevant Orders   MM DIGITAL SCREENING BILATERAL   Seizures ([place])    No breakthrough seizures Followed by [facility] from [facility] CVA in past On Keppra May switch Neuro locally to [facility] in future, notify office       Other Visit Diagnoses    Annual physical exam    -  Primary   Needs flu shot       Relevant Orders   Flu Vaccine QUAD 36+ mos IM (Completed)      Updated Health Maintenance information - Due flu vaccine get today - ordered mamm[facility] for [facility], she can contact them to schedule. Reviewed recent lab results with patient Encouraged improvement to lifestyle with diet and exercise - Goal of weight loss    Meds ordered this encounter  Medications  . rosuvastatin (CRESTOR) 10 MG tablet    Sig: Take 1 tablet (10 mg total) by mouth at bedtime.    Dispense:  90 tablet    Refill:  3    Follow up plan: Return in about 3 months (around 09/19/2019) for lab / virtual visit results.  Future add CMET, Lipid 09/2019 for f/u statin medication  Nobie Putnam, DO [facility] 06/20/2019, 11:01 AM

## 2019-06-20 NOTE — Assessment & Plan Note (Signed)
Controlled, stable major depression in partial remission See PHQ On fluoxetine 30mg  daily, no change Some insomnia, discussed per AVS Sleep Hygiene, adjust melatonin

## 2019-06-20 NOTE — Patient Instructions (Addendum)
Thank you for coming to the office today.  1. Chemistry - Improved creatinine to 1.14 (still mild elevated), previously elevated 2 years ago to 1.4 to 1.6. Otherwise, normal results, including electrolytes and liver function. Normal fasting blood sugar   2. Hemoglobin A1c (Diabetes screening) - 5.0, normal not in range of Pre-Diabetes (>5.7 to 6.4)   3. Routine screening HIV and Hepatitis C - Both Negative.   4. TSH Thyroid Function Tests - Normal.   5. Cholesterol - Significantly elevated LDL bad cholesterol 187, otherwise normal HDL and TG. On Fish Oil, can consider statin therapy.   6. CBC Blood Counts - Normal, no anemia, other abnormality   -------------------------------------------------  Let me know - message or call - when ready to change Neurologist - to local [facility] - Dr Manuella Ghazi or Dr Melrose Nakayama or other Nurse Practitioner available there.   Sleep Hygiene Recommendations to promote healthy sleep in all patients, especially if symptoms of insomnia are worsening. Due to the nature of sleep rhythms, if your body gets "out of rhythm", it may take some time before your sleep cycle can be "reset".  Please try to follow as many of the following tips as you can, usually there are only a few of these are the primary cause of the problem.  ?To reset your sleep rhythm, go to bed and get up at the same time every day ?Sleep only long enough to feel rested and then get out of bed ?Do not try to force yourself to sleep. If you can't sleep, get out of bed and try again later. ?Avoid naps during the day, unless excessively tired. The more sleeping during the day, then the less sleep your body needs at night.  ?Have coffee, tea, and other foods that have caffeine only in the morning ?Exercise several days a week, but not right before bed ?If you drink alcohol, prefer to have appropriate drink with one meal, but prefer to avoid alcohol in the evening, and bedtime ?If you smoke,  avoid smoking, especially in the evening  ?Avoid watching TV or looking at phones, computers, or reading devices ("e-books") that give off light at least 30 minutes before bed. This artificial light sends "awake signals" to your brain and can make it harder to fall asleep. ?Make your bedroom a comfortable place where it is easy to fall asleep: ? Put up shades or special blackout curtains to block light from outside. ? Use a white noise machine to block noise. ? Keep the temperature cool. ?Try your best to solve or at least address your problems before you go to bed ?Use relaxation techniques to manage stress. Ask your health care provider to suggest some techniques that may work well for you. These may include: ? Breathing exercises. ? Routines to release muscle tension. ? Visualizing peaceful scenes.  DUE for FASTING BLOOD WORK (no food or drink after midnight before the lab appointment, only water or coffee without cream/sugar on the morning of)  SCHEDULE "Lab Only" visit in the morning at the clinic for lab draw in 3 MONTHS   - Make sure Lab Only appointment is at about 1 week before your next appointment, so that results will be available  For Lab Results, once available within 2-3 days of blood draw, you can can log in to MyChart online to view your results and a brief explanation. Also, we can discuss results at next follow-up visit.   Please schedule a Follow-up Appointment to: Return in about  3 months (around 09/19/2019) for lab / virtual visit results.  If you have any other questions or concerns, please feel free to call the office or send a message through [place]. You may also schedule an earlier appointment if necessary.  Additionally, you may be receiving a survey about your experience at our office within a few days to 1 week by e-mail or mail. We value your feedback.  Nobie Putnam, DO [place]

## 2019-06-20 NOTE — Assessment & Plan Note (Signed)
Improved Cr on last lab 1.14, likely baseline Improved from 2 yr ago likely was AKI Improve hydration No HTN or DM

## 2019-06-20 NOTE — Assessment & Plan Note (Signed)
Previous followed Pulm No longer on CPAP ineffective

## 2019-06-20 NOTE — Assessment & Plan Note (Signed)
Uncontrolled cholesterol LDL Last lipid panel 06/2019 Calculated ASCVD 10 yr risk score 3.3% low risk  Plan: 1. START Rosuvastatin 10mg  nightly - counseling on medication benefit risk prevention of ASCVD potential side effect 2. Continue ASA 81mg  for primary ASCVD risk reduction - future can reduce med if on statin 3. Encourage improved lifestyle - low carb/cholesterol, reduce portion size, continue improving regular exercise 4. Follow-up 3 months lab CMET Lipid adjust med

## 2019-06-20 NOTE — Assessment & Plan Note (Signed)
Encourage weight loss lifestyle diet exercise

## 2019-07-14 ENCOUNTER — Other Ambulatory Visit: Payer: Self-pay

## 2019-07-14 DIAGNOSIS — Z20828 Contact with and (suspected) exposure to other viral communicable diseases: Secondary | ICD-10-CM | POA: Diagnosis not present

## 2019-07-14 DIAGNOSIS — Z20822 Contact with and (suspected) exposure to covid-19: Secondary | ICD-10-CM

## 2019-07-16 LAB — NOVEL CORONAVIRUS, NAA: SARS-CoV-2, NAA: NOT DETECTED

## 2019-07-19 ENCOUNTER — Other Ambulatory Visit: Payer: Self-pay | Admitting: Neurology

## 2019-07-23 ENCOUNTER — Telehealth: Payer: Self-pay | Admitting: Neurology

## 2019-07-23 NOTE — Telephone Encounter (Signed)
LVM for pt to call back and r/s 12/21 due to NP being out

## 2019-07-25 ENCOUNTER — Other Ambulatory Visit: Payer: Self-pay | Admitting: Neurology

## 2019-08-04 ENCOUNTER — Other Ambulatory Visit: Payer: Self-pay | Admitting: Family Medicine

## 2019-08-04 NOTE — Telephone Encounter (Signed)
Pt called said that she have questions about new medication that was given to her

## 2019-08-04 NOTE — Telephone Encounter (Signed)
Attempted to contact the pt, no answer. LMOM to return my call.  

## 2019-08-04 NOTE — Telephone Encounter (Signed)
The pt came by the office today and directed to go back to her car, because she was complaining of diarrhea. She states that she had a loose bm once every 2-3 days for about 1 mth. She also complains of intermittent vomiting, loss of taste, smell and mild coughing. She had a negative COVID 19 test recently. The pt think her symptoms could possibly be related to the Crestor. Please advise

## 2019-08-04 NOTE — Telephone Encounter (Signed)
These symptoms are not common side effects of crestor, which was recently started for cardiovascular prevention.  However, nausea is on the side effect list.  I am concerned that she has a viral syndrome, even though COVID test was negative.  She should still practice social quarantine practices for now and continue conservative care with hydration and OTC meds as needed.  If she prefers - she can stop Crestor at any time to see if her symptoms improve, and monitor off of it for now.  It was only a preventative medicine for her, so if she cannot tolerate it - that is okay we can discuss in future again  Nobie Putnam, [facility] 08/04/2019, 4:06 PM

## 2019-08-05 ENCOUNTER — Other Ambulatory Visit: Payer: Self-pay | Admitting: *Deleted

## 2019-08-05 DIAGNOSIS — Z20828 Contact with and (suspected) exposure to other viral communicable diseases: Secondary | ICD-10-CM | POA: Diagnosis not present

## 2019-08-05 DIAGNOSIS — Z20822 Contact with and (suspected) exposure to covid-19: Secondary | ICD-10-CM

## 2019-08-05 NOTE — Telephone Encounter (Signed)
The pt was notified of Dr. K recommendation. She verbalize understanding, no questions or concerns.  

## 2019-08-07 LAB — NOVEL CORONAVIRUS, NAA: SARS-CoV-2, NAA: NOT DETECTED

## 2019-08-11 ENCOUNTER — Encounter: Payer: Self-pay | Admitting: Neurology

## 2019-08-11 NOTE — Telephone Encounter (Signed)
lvm x2 c/a letter sent

## 2019-09-09 ENCOUNTER — Other Ambulatory Visit: Payer: Self-pay

## 2019-09-09 DIAGNOSIS — E7800 Pure hypercholesterolemia, unspecified: Secondary | ICD-10-CM

## 2019-09-09 MED ORDER — ROSUVASTATIN CALCIUM 10 MG PO TABS: 10.0000 mg | ORAL_TABLET | Freq: Every day | ORAL | 3 refills | Status: DC

## 2019-09-10 ENCOUNTER — Other Ambulatory Visit: Payer: Self-pay

## 2019-09-11 ENCOUNTER — Other Ambulatory Visit: Payer: Self-pay

## 2019-09-11 DIAGNOSIS — F329 Major depressive disorder, single episode, unspecified: Secondary | ICD-10-CM

## 2019-09-11 DIAGNOSIS — F32A Depression, unspecified: Secondary | ICD-10-CM

## 2019-09-11 MED ORDER — LEVETIRACETAM 750 MG PO TABS: 750.0000 mg | ORAL_TABLET | Freq: Two times a day (BID) | ORAL | 0 refills | Status: DC

## 2019-09-11 MED ORDER — FLUOXETINE HCL 10 MG PO CAPS: ORAL_CAPSULE | ORAL | 0 refills | Status: DC

## 2019-09-11 MED ORDER — FLUOXETINE HCL 20 MG PO CAPS: ORAL_CAPSULE | ORAL | 0 refills | Status: DC

## 2019-09-29 ENCOUNTER — Ambulatory Visit: Payer: BLUE CROSS/BLUE SHIELD | Admitting: Neurology

## 2019-10-06 NOTE — Progress Notes (Signed)
PATIENT: [name] DOB: [birth year]  REASON FOR VISIT: follow up HISTORY FROM: patient  HISTORY OF PRESENT ILLNESS: Today 10/07/19  [name] is a 63 year old female with history of subarachnoid hemorrhage and subsequent seizures.  She remains on Keppra 750 mg twice a day.  She indicates she has tolerated medication well, and she does notice that it makes her sleep well.  She has not had recurrent seizure.  She says she may have an occasional mild headache, last week she had a moderate headache, that was relieved with Advil, no other symptoms.  She does report some mild word finding difficulties as result of the subarachnoid hemorrhage.  She has been diagnosed with sleep apnea, but was unable to tolerate CPAP.  She remains very active, drives a car without difficulty.  She has not had any changes to her balance, or had any falls.  She is taking Prozac.  She says her overall health has been well, she is now taking Crestor.  She lives with her husband, and her father.  She presents today for evaluation unaccompanied.  HISTORY 09/25/2018 MM: [name] is a 63 year old female with a history of subarachnoid hemorrhage with subsequent seizures.  She returns today for follow-up.  She continues to do well on Keppra.  She is currently taking Keppra 750 mg twice a day.  She denies any seizures.  Denies any changes in her mood or behavior.  She did have a sleep study and AutoPap was recommended however she could not tolerate the CPAP so she turned her machine back in.  She reports that she continues to have fatigue during the day but it is manageable.  Reports that she typically takes a nap around 2 PM.  She returns today for evaluation.   REVIEW OF SYSTEMS: Out of a complete 14 system review of symptoms, the patient complains only of the following symptoms, and all other reviewed systems are negative.  Memory loss, headache  ALLERGIES: No Known Allergies  HOME MEDICATIONS: Outpatient  Medications Prior to Visit  Medication Sig Dispense Refill  . acetaminophen (TYLENOL) 500 MG tablet Take 2 tablets (1,000 mg total) by mouth every 6 (six) hours as needed for mild pain or moderate pain. 30 tablet 0  . aspirin EC 81 MG tablet Take 81 mg by mouth daily.    . B Complex Vitamins (VITAMIN-B COMPLEX PO) Take by mouth daily.    Marland Kitchen FLUoxetine (PROZAC) 10 MG capsule TAKE 1 CAPSULE BY MOUTH EVERY DAY 90 capsule 0  . FLUoxetine (PROZAC) 20 MG capsule TAKE 1 CAPSULE BY MOUTH EVERY DAY 90 capsule 0  . levETIRAcetam (KEPPRA) 750 MG tablet Take 1 tablet (750 mg total) by mouth 2 (two) times daily. 180 tablet 0  . Melatonin 3 MG TABS Take 6 mg by mouth at bedtime.    . Multiple Vitamin (MULTIVITAMIN WITH MINERALS) TABS Take 1 tablet by mouth daily.     . Omega-3 Fatty Acids (FISH OIL) 1200 MG CAPS Take 1 capsule by mouth 2 (two) times daily.    . rosuvastatin (CRESTOR) 10 MG tablet Take 1 tablet (10 mg total) by mouth at bedtime. 90 tablet 3  . VITAMIN D, CHOLECALCIFEROL, PO Take by mouth daily.     No facility-administered medications prior to visit.    PAST MEDICAL HISTORY: Past Medical History:  Diagnosis Date  . Anxiety   . Arthritis    left hip  . GERD (gastroesophageal reflux disease)   . Hepatitis 1972  hep B  . Memory disorder 09/21/2017  . OSA (obstructive sleep apnea) 05/08/2018  . PONV (postoperative nausea and vomiting)   . Seizures ([place])     PAST SURGICAL HISTORY: Past Surgical History:  Procedure Laterality Date  . ABDOMINAL HYSTERECTOMY  04/1999  . ARTHROSCOPIC REPAIR ACL     bilateral knees  . BREAST EXCISIONAL BIOPSY Right 2008   NEG  . BREAST EXCISIONAL BIOPSY Left YRS AGO   X2 - NEG  . BREAST LUMPECTOMY  06/1979; 03/1995   left  . CHOLECYSTECTOMY  09/1987  . LAPAROSCOPIC GASTRIC SLEEVE RESECTION  09/30/2012   Procedure: LAPAROSCOPIC GASTRIC SLEEVE RESECTION;  Surgeon: Madilyn Hook, DO;  Location: [facility];  Service: General;  Laterality: N/A;  . TOTAL HIP  ARTHROPLASTY  11/13/2012   left  . UPPER GI ENDOSCOPY  09/30/2012   Procedure: UPPER GI ENDOSCOPY;  Surgeon: Madilyn Hook, DO;  Location: [facility];  Service: General;;    FAMILY HISTORY: Family History  Problem Relation Age of Onset  . Hyperlipidemia Mother   . Obesity Mother   . Pulmonary fibrosis Mother   . Cancer Maternal Aunt        ovarian cancer  . Cancer Father   . Colon cancer Paternal Grandmother   . Esophageal cancer Neg Hx   . Stomach cancer Neg Hx   . Rectal cancer Neg Hx   . Breast cancer Neg Hx     SOCIAL HISTORY: Social History   Socioeconomic History  . Marital status: Married    Spouse name: Not on file  . Number of children: 0  . Years of education: BS  . Highest education level: Not on file  Occupational History  . Not on file  Tobacco Use  . Smoking status: Former Smoker    Packs/day: 0.25    Years: 5.00    Pack years: 1.25    Types: Cigarettes    Quit date: 07/25/2001    Years since quitting: 18.2  . Smokeless tobacco: Former Network engineer and Sexual Activity  . Alcohol use: Yes    Alcohol/week: 0.0 standard drinks    Comment: 2 glasses of wine per week  . Drug use: No  . Sexual activity: Not on file  Other Topics Concern  . Not on file  Social History Narrative   Lives with spouse   Caffeine use: Drinks coffee daily   Right handed   Social Determinants of Health   Financial Resource Strain:   . Difficulty of Paying Living Expenses: Not on file  Food Insecurity:   . Worried About Charity fundraiser in the Last Year: Not on file  . Ran Out of Food in the Last Year: Not on file  Transportation Needs:   . Lack of Transportation (Medical): Not on file  . Lack of Transportation (Non-Medical): Not on file  Physical Activity:   . Days of Exercise per Week: Not on file  . Minutes of Exercise per Session: Not on file  Stress:   . Feeling of Stress : Not on file  Social Connections:   . Frequency of Communication with Friends and  Family: Not on file  . Frequency of Social Gatherings with Friends and Family: Not on file  . Attends Religious Services: Not on file  . Active Member of Clubs or Organizations: Not on file  . Attends Archivist Meetings: Not on file  . Marital Status: Not on file  Intimate Partner Violence:   . Fear of  Current or Ex-Partner: Not on file  . Emotionally Abused: Not on file  . Physically Abused: Not on file  . Sexually Abused: Not on file      PHYSICAL EXAM  Vitals:   10/07/19 1105  BP: 132/62  Pulse: 66  Temp: 97.8 F (36.6 C)  TempSrc: Oral  Weight: 216 lb 12.8 oz (98.3 kg)  Height: 5\' 4"  (1.626 m)   Body mass index is 37.21 kg/m.  Generalized: Well developed, in no acute distress   Neurological examination  Mentation: Alert oriented to time, place, history taking. Follows all commands speech and language fluent Cranial nerve II-XII: Pupils were equal round reactive to light. Extraocular movements were full, visual field were full on confrontational test. Facial sensation and strength were normal. Head turning and shoulder shrug  were normal and symmetric. Motor: The motor testing reveals 5 over 5 strength of all 4 extremities. Good symmetric motor tone is noted throughout.  Sensory: Sensory testing is intact to soft touch on all 4 extremities. No evidence of extinction is noted.  Coordination: Cerebellar testing reveals good finger-nose-finger and heel-to-shin bilaterally.  Gait and station: Gait is normal. Tandem gait is normal. Romberg is negative. No drift is seen.  Reflexes: Deep tendon reflexes are symmetric and normal bilaterally.   DIAGNOSTIC DATA (LABS, IMAGING, TESTING) - I reviewed patient records, labs, notes, testing and imaging myself where available.  Lab Results  Component Value Date   WBC 7.9 06/18/2019   HGB 12.9 06/18/2019   HCT 39.0 06/18/2019   MCV 95.6 06/18/2019   PLT 325 06/18/2019      Component Value Date/Time   NA 144  06/18/2019 1010   NA 140 11/18/2012 0530   K 5.2 06/18/2019 1010   K 4.0 11/18/2012 0530   CL 109 06/18/2019 1010   CL 108 (H) 11/18/2012 0530   CO2 28 06/18/2019 1010   CO2 27 11/18/2012 0530   GLUCOSE 88 06/18/2019 1010   GLUCOSE 95 11/18/2012 0530   BUN 15 06/18/2019 1010   BUN 8 11/18/2012 0530   CREATININE 1.14 (H) 06/18/2019 1010   CALCIUM 9.4 06/18/2019 1010   CALCIUM 7.9 (L) 11/18/2012 0530   PROT 6.2 06/18/2019 1010   ALBUMIN 3.7 01/08/2017 1147   AST 14 06/18/2019 1010   ALT 11 06/18/2019 1010   ALKPHOS 88 01/08/2017 1147   BILITOT 0.5 06/18/2019 1010   GFRNONAA 39 (L) 04/08/2017 1334   GFRNONAA >60 11/18/2012 0530   GFRAA 45 (L) 04/08/2017 1334   GFRAA >60 11/18/2012 0530   Lab Results  Component Value Date   CHOL 276 (H) 06/18/2019   HDL 63 06/18/2019   LDLCALC 187 (H) 06/18/2019   LDLDIRECT 146.6 12/19/2012   TRIG 127 06/18/2019   CHOLHDL 4.4 06/18/2019   Lab Results  Component Value Date   HGBA1C 5.0 06/18/2019   Lab Results  Component Value Date   VITAMINB12 >1500 (H) 03/31/2014   Lab Results  Component Value Date   TSH 2.14 06/18/2019    ASSESSMENT AND PLAN 63 y.o. year old female  has a past medical history of Anxiety, Arthritis, GERD (gastroesophageal reflux disease), Hepatitis (1972), Memory disorder (09/21/2017), OSA (obstructive sleep apnea) (05/08/2018), PONV (postoperative nausea and vomiting), and Seizures ([place]). here with:  1.  Seizures 2.  Depression/anxiety  She has continued to do well, she has not had recurrent seizure.  She will remain on Keppra 750 mg twice a day.  She will monitor her headaches, will let me know  if they become more frequent, we discussed warning signs of significant headache with history of subarachnoid hemorrhage.  She will remain on Prozac.  She will follow-up in 1 year or sooner if needed. I did advise if her symptoms worsen or she develops any new symptoms she should let us know.  I spent 15 minutes with the  patient. 50% of this time was spent discussing her plan of care.  Butler Denmark, AGNP-C, DNP 10/07/2019, 11:24 AM [facility] [address], [place], [place] [address] [phone]

## 2019-10-07 ENCOUNTER — Encounter: Payer: Self-pay | Admitting: Neurology

## 2019-10-07 ENCOUNTER — Ambulatory Visit (INDEPENDENT_AMBULATORY_CARE_PROVIDER_SITE_OTHER): Payer: Medicare HMO | Admitting: Neurology

## 2019-10-07 ENCOUNTER — Other Ambulatory Visit: Payer: Self-pay

## 2019-10-07 VITALS — BP 132/62 | HR 66 | Temp 97.8°F | Ht 64.0 in | Wt 216.8 lb

## 2019-10-07 DIAGNOSIS — R569 Unspecified convulsions: Secondary | ICD-10-CM

## 2019-10-07 MED ORDER — LEVETIRACETAM 750 MG PO TABS: 750.0000 mg | ORAL_TABLET | Freq: Two times a day (BID) | ORAL | 3 refills | Status: DC

## 2019-10-07 NOTE — Progress Notes (Signed)
I have read the note, and I agree with the clinical assessment and plan.  [name]   

## 2019-10-07 NOTE — Patient Instructions (Signed)
Continue Keppra at current dose. Please call for any problems, worsening headache, or seizure.

## 2019-11-22 ENCOUNTER — Ambulatory Visit: Payer: Self-pay

## 2019-11-25 ENCOUNTER — Telehealth: Payer: Self-pay

## 2019-11-25 DIAGNOSIS — L538 Other specified erythematous conditions: Secondary | ICD-10-CM | POA: Diagnosis not present

## 2019-11-25 DIAGNOSIS — D2261 Melanocytic nevi of right upper limb, including shoulder: Secondary | ICD-10-CM | POA: Diagnosis not present

## 2019-11-25 DIAGNOSIS — D225 Melanocytic nevi of trunk: Secondary | ICD-10-CM | POA: Diagnosis not present

## 2019-11-25 DIAGNOSIS — L298 Other pruritus: Secondary | ICD-10-CM | POA: Diagnosis not present

## 2019-11-25 DIAGNOSIS — L820 Inflamed seborrheic keratosis: Secondary | ICD-10-CM | POA: Diagnosis not present

## 2019-11-25 DIAGNOSIS — D2271 Melanocytic nevi of right lower limb, including hip: Secondary | ICD-10-CM | POA: Diagnosis not present

## 2019-11-25 DIAGNOSIS — D2262 Melanocytic nevi of left upper limb, including shoulder: Secondary | ICD-10-CM | POA: Diagnosis not present

## 2019-11-25 DIAGNOSIS — D2272 Melanocytic nevi of left lower limb, including hip: Secondary | ICD-10-CM | POA: Diagnosis not present

## 2019-11-25 DIAGNOSIS — L821 Other seborrheic keratosis: Secondary | ICD-10-CM | POA: Diagnosis not present

## 2019-11-25 NOTE — Telephone Encounter (Signed)
[employer] form given to Dr. Jannifer Franklin to review.

## 2019-11-26 NOTE — Telephone Encounter (Signed)
[employer] form done and completed. Given tot [name] in medical records for payment processing.

## 2019-12-02 ENCOUNTER — Other Ambulatory Visit: Payer: Self-pay | Admitting: Neurology

## 2019-12-04 ENCOUNTER — Telehealth: Payer: Self-pay | Admitting: *Deleted

## 2019-12-04 NOTE — Telephone Encounter (Signed)
I faxed pt form to [facility]. @ [phone]

## 2019-12-23 ENCOUNTER — Telehealth: Payer: Self-pay | Admitting: *Deleted

## 2019-12-23 NOTE — Telephone Encounter (Signed)
I faxed pt [employer] form and medical records on 12/23/19 to [phone]

## 2019-12-23 NOTE — Telephone Encounter (Signed)
Dr Jannifer Franklin completed and signed [employer] disability form, printed office note from 10/07/2019. I sent this to medical records for processing and kept a copy for his nurse's records.

## 2020-03-05 ENCOUNTER — Other Ambulatory Visit: Payer: Self-pay | Admitting: Neurology

## 2020-03-05 DIAGNOSIS — F32A Depression, unspecified: Secondary | ICD-10-CM

## 2020-03-09 ENCOUNTER — Other Ambulatory Visit: Payer: Self-pay

## 2020-03-09 DIAGNOSIS — F32A Depression, unspecified: Secondary | ICD-10-CM

## 2020-03-09 MED ORDER — FLUOXETINE HCL 20 MG PO CAPS: ORAL_CAPSULE | ORAL | 1 refills | Status: DC

## 2020-03-10 DIAGNOSIS — F3341 Major depressive disorder, recurrent, in partial remission: Secondary | ICD-10-CM

## 2020-03-11 MED ORDER — FLUOXETINE HCL 10 MG PO CAPS: 10.0000 mg | ORAL_CAPSULE | Freq: Every day | ORAL | 1 refills | Status: DC

## 2020-03-11 MED ORDER — FLUOXETINE HCL 20 MG PO CAPS: 20.0000 mg | ORAL_CAPSULE | Freq: Every day | ORAL | 1 refills | Status: DC

## 2020-03-11 NOTE — Addendum Note (Signed)
Addended by: Olin Hauser on: 03/11/2020 02:03 PM   Modules accepted: Orders

## 2020-05-04 ENCOUNTER — Ambulatory Visit: Payer: Medicare HMO | Admitting: Family Medicine

## 2020-05-05 ENCOUNTER — Ambulatory Visit (INDEPENDENT_AMBULATORY_CARE_PROVIDER_SITE_OTHER): Payer: Medicare HMO | Admitting: Family Medicine

## 2020-05-05 ENCOUNTER — Encounter: Payer: Self-pay | Admitting: Family Medicine

## 2020-05-05 ENCOUNTER — Other Ambulatory Visit: Payer: Self-pay

## 2020-05-05 ENCOUNTER — Other Ambulatory Visit: Payer: Self-pay | Admitting: Family Medicine

## 2020-05-05 VITALS — BP 100/70 | HR 77 | Temp 97.3°F | Resp 16 | Ht 63.0 in | Wt 211.6 lb

## 2020-05-05 DIAGNOSIS — G4700 Insomnia, unspecified: Secondary | ICD-10-CM | POA: Insufficient documentation

## 2020-05-05 DIAGNOSIS — R569 Unspecified convulsions: Secondary | ICD-10-CM

## 2020-05-05 DIAGNOSIS — E559 Vitamin D deficiency, unspecified: Secondary | ICD-10-CM

## 2020-05-05 DIAGNOSIS — R7309 Other abnormal glucose: Secondary | ICD-10-CM

## 2020-05-05 DIAGNOSIS — G250 Essential tremor: Secondary | ICD-10-CM | POA: Diagnosis not present

## 2020-05-05 DIAGNOSIS — E7800 Pure hypercholesterolemia, unspecified: Secondary | ICD-10-CM

## 2020-05-05 DIAGNOSIS — F3341 Major depressive disorder, recurrent, in partial remission: Secondary | ICD-10-CM

## 2020-05-05 DIAGNOSIS — G4733 Obstructive sleep apnea (adult) (pediatric): Secondary | ICD-10-CM | POA: Diagnosis not present

## 2020-05-05 DIAGNOSIS — E669 Obesity, unspecified: Secondary | ICD-10-CM

## 2020-05-05 DIAGNOSIS — F5104 Psychophysiologic insomnia: Secondary | ICD-10-CM

## 2020-05-05 DIAGNOSIS — Z0000 Encounter for general adult medical examination without abnormal findings: Secondary | ICD-10-CM

## 2020-05-05 NOTE — Assessment & Plan Note (Signed)
No breakthrough seizures Followed by [facility] from [facility] CVA in past On Keppra 750 BID - may be causing hypersomnia drowsiness, she can check with Neuro sooner than waiting til 09/2020 for apt, can determine if should reduce to possibly 500 BID or other dose

## 2020-05-05 NOTE — Patient Instructions (Addendum)
Thank you for coming to the office today.  Ask Neurologist about dose reduction of Keppra from 750 twice a day down to 500 twice a day. This certainly could explain drowsiness and sleeping increased. See what they think and they can adjust this one most likely, let them know we talked about it.  Tremors most likely "essential tremors" are very common. Likely with combination of muscle / nerve weakness in arms, and can occur more when strained or stressed or fatigued muscles - following repetitive activities. - Goals to monitor this further, if it is just arms / wrist / hands only when active and lifting or carrying or doing a task, that is okay, if it becomes bothersome let me know, if it improves with alcohol this can be very normal as well. - we can offer medication (Propranolol or Primidone) to help slow down the effects but does not always cure it - the worrisome tremors are the ones that occur at rest only, these are very different and can be neurological - Check with neurologist if further concerns or worse severity.   PSYCHIATRY / [facility] Address: [address] [place], [place] [address] [email or web address] Phone: [phone]  [facility] [address], [place] [address] [place] P: [place], MD [place], [place] [address] Phone: [phone]   [facility] (All ages) [address] [place], [id number] Phone: [phone] (Option 1) [email or web address]  [facility]) [place] [address], [place], [place] [address] Phone: [phone]  ----------------------------------------------------------------- PSYCHOLOGY COUNSELING ONLY  [facility]  [place] Irish, LCSW [address] [place], [place]: [place].   Address: [place], [place], [place] [address] Hours: Open today  9AM-7PM Phone: [phone]  [facility] [address] [place] Address: [address] [place], [place], [place] [address] Phone: [phone]   Please schedule a Follow-up Appointment to: Return in about 7 weeks (around 06/21/2020) for Add fasting lab only 1 week before, keep already scheduled apt in September.  If you have any other questions or concerns, please feel free to call the office or send a message through [place]. You may also schedule an earlier appointment if necessary.  Additionally, you may be receiving a survey about your experience at our office within a few days to 1 week by e-mail or mail. We value your feedback.  Nobie Putnam, DO [place]

## 2020-05-05 NOTE — Progress Notes (Signed)
Subjective:    Patient ID: [name], female    DOB: 09-Jul-1956, 64 y.o.   MRN: [id number]  [name] is a 64 y.o. female presenting on 05/05/2020 for Tremors (both hands onset month) and Sleeping Problem (as per patient sleeping too much--can't come back from her dream after waking up)   HPI   Tremors, bilateral hands She is Right hand dominant. Reports bilateral hands episodic tremors, mostly Left but also R at times. Seems to be worse with movements or carrying or lifting. Reported episode occurred after doing repetitive activities. She says often if weeding in garden for while can have more pronounced tremor if lifting or doing something. - Denies any resting tremor Only significant medicine change in past 1 year, newly started Rosuvastatin 10mg  nightly in 06/2019. Denies any significant muscle ache or pain or spasm as side effect. - Also still taking Aspirin 81mg  daily. Without problems.  Major Depression, recurrent in partial remission Seizure Disorder Followed by [facility] yearly. Recent worsening mood and anxiety with "everything in the world" covid pandemic and other stressors bothering her some. She says still taking Fluoxetine 30mg  daily, overall doing well and not wanting to change therapy. - Sleeping more often, she says takes nap daily, feels some hypersomnia. She attributes to sedation on Keppra medication on 750mg  BID still from Neurologist - now she asks about dose reduction.  OSA no longer on CPAP, ineffective.  Health Maintenance: Updated COVID vaccine.  Depression screen [facility] 2/9 05/05/2020 06/20/2019 05/22/2018  Decreased Interest 1 0 1  Down, Depressed, Hopeless 2 0 0  PHQ - 2 Score 3 0 1  Altered sleeping 1 1 3   Tired, decreased energy 1 1 1   Change in appetite 1 0 1  Feeling bad or failure about yourself  1 0 1  Trouble concentrating 2 0 1  Moving slowly or fidgety/restless 1 0 1  Suicidal thoughts 1 0 0  PHQ-9 Score 11  2 9   Difficult doing work/chores Somewhat difficult Not difficult at all Not difficult at all   [place]-Suicide Severity Rating Scale 1) Have you wished you were dead or wished you could go to sleep and not wake up? - Yes  2) Have you had any actual thoughts of killing yourself? - No  Skip questions 3,4, 5  6) Have you ever done anything, started to do anything, or prepared to do anything to end your life? - No   GAD 7 : Generalized Anxiety Score 05/05/2020 05/22/2018  Nervous, Anxious, on Edge 1 1  Control/stop worrying 1 0  Worry too much - different things 0 0  Trouble relaxing 1 0  Restless 0 0  Easily annoyed or irritable 0 0  Afraid - awful might happen 2 1  Total GAD 7 Score 5 2  Anxiety Difficulty Somewhat difficult Not difficult at all    Social History   Tobacco Use  . Smoking status: Former Smoker    Packs/day: 0.25    Years: 5.00    Pack years: 1.25    Types: Cigarettes    Quit date: 07/25/2001    Years since quitting: 18.7  . Smokeless tobacco: Former Network engineer Use Topics  . Alcohol use: Yes    Alcohol/week: 0.0 standard drinks    Comment: 2 glasses of wine per week  . Drug use: No    Review of Systems Per HPI unless specifically indicated above     Objective:    BP 100/70  Pulse 77   Temp (!) 97.3 F (36.3 C) (Temporal)   Resp 16   Ht 5\' 3"  (1.6 m)   Wt (!) 211 lb 9.6 oz (96 kg)   SpO2 98%   BMI 37.48 kg/m   Wt Readings from Last 3 Encounters:  05/05/20 (!) 211 lb 9.6 oz (96 kg)  10/07/19 216 lb 12.8 oz (98.3 kg)  06/20/19 229 lb (103.9 kg)    Physical Exam Vitals and nursing note reviewed.  Constitutional:      General: She is not in acute distress.    Appearance: She is well-developed. She is not diaphoretic.     Comments: Well-appearing, comfortable, cooperative  HENT:     Head: Normocephalic and atraumatic.  Eyes:     General:        Right eye: No discharge.        Left eye: No discharge.     Conjunctiva/sclera:  Conjunctivae normal.  Cardiovascular:     Rate and Rhythm: Normal rate.  Pulmonary:     Effort: Pulmonary effort is normal.  Musculoskeletal:     Right lower leg: No edema.     Left lower leg: No edema.  Skin:    General: Skin is warm and dry.     Findings: No erythema or rash.  Neurological:     General: No focal deficit present.     Mental Status: She is alert and oriented to person, place, and time.     Cranial Nerves: No cranial nerve deficit.     Sensory: No sensory deficit.     Motor: No weakness.     Comments: Strength intact bilateral grip symmetrical.  No evidence of resting tremor.  No obvious intention tremor today provoked.  Psychiatric:        Behavior: Behavior normal.     Comments: Well groomed, good eye contact, normal speech and thoughts       Results for orders placed or performed in visit on 08/05/19  Novel Coronavirus, NAA ([facility])   Specimen: Oropharyngeal(OP) collection in vial transport medium   OROPHARYNGEA  TESTING  Result Value Ref Range   SARS-CoV-2, NAA Not Detected Not Detected      Assessment & Plan:   Problem List Items Addressed This Visit    Seizures ([place])    No breakthrough seizures [facility] in past On Keppra 750 BID - may be causing hypersomnia drowsiness, she can check with Neuro sooner than waiting til 09/2020 for apt, can determine if should reduce to possibly 500 BID or other dose      Recurrent major depression in partial remission ([place])    Controlled, stable major depression in partial remission See PHQ - some elevated today Discussion on meds - goal to continue current course On fluoxetine 30mg  daily, no change today History of insomnia, now has hypersomnia, some drowsiness on Keppra 750 BID, asked her to discuss with neuro      OSA (obstructive sleep apnea)    Off CPAP, ineffective Previous dx OSA No longer followed by Pulm She sees Neurology      Insomnia    Improved Now  hypersomnia, taking naps at times. On current med management for mood Seems keppra may be affecting sleep with inc drowsiness       Other Visit Diagnoses    Benign essential tremor    -  Primary     Clinically history and exam supportive of benign essential tremor Seems intention  tremors only, L >R non dominant hand, worse after provoked with repetitive activity May be normal muscle fatigue and other factors Discussion on tremors today, reassurance given Monitor symptoms, if worsening or impacting function or new symptoms, notify us or her Neurologist Future consider med if indicated only.   No orders of the defined types were placed in this encounter.     Follow up plan: Return in about 7 weeks (around 06/21/2020) for Add fasting lab only 1 week before, keep already scheduled apt in September.  Future labs ordered for 06/16/20  Nobie Putnam, [facility] 05/05/2020, 9:09 AM

## 2020-05-05 NOTE — Assessment & Plan Note (Signed)
Improved Now hypersomnia, taking naps at times. On current med management for mood Seems keppra may be affecting sleep with inc drowsiness

## 2020-05-05 NOTE — Assessment & Plan Note (Signed)
Off CPAP, ineffective Previous dx OSA No longer followed by Pulm She sees Neurology

## 2020-05-05 NOTE — Assessment & Plan Note (Signed)
Controlled, stable major depression in partial remission See PHQ - some elevated today Discussion on meds - goal to continue current course On fluoxetine 30mg  daily, no change today History of insomnia, now has hypersomnia, some drowsiness on Keppra 750 BID, asked her to discuss with neuro

## 2020-05-10 ENCOUNTER — Other Ambulatory Visit: Payer: Self-pay | Admitting: Neurology

## 2020-05-10 MED ORDER — LEVETIRACETAM ER 500 MG PO TB24: 1500.0000 mg | ORAL_TABLET | Freq: Every day | ORAL | 1 refills | Status: DC

## 2020-05-12 ENCOUNTER — Other Ambulatory Visit: Payer: Self-pay | Admitting: Neurology

## 2020-05-12 DIAGNOSIS — F3341 Major depressive disorder, recurrent, in partial remission: Secondary | ICD-10-CM

## 2020-06-15 ENCOUNTER — Other Ambulatory Visit: Payer: Self-pay | Admitting: *Deleted

## 2020-06-15 DIAGNOSIS — F3341 Major depressive disorder, recurrent, in partial remission: Secondary | ICD-10-CM

## 2020-06-15 DIAGNOSIS — E7800 Pure hypercholesterolemia, unspecified: Secondary | ICD-10-CM

## 2020-06-15 DIAGNOSIS — R7309 Other abnormal glucose: Secondary | ICD-10-CM

## 2020-06-15 DIAGNOSIS — Z0000 Encounter for general adult medical examination without abnormal findings: Secondary | ICD-10-CM

## 2020-06-15 DIAGNOSIS — E669 Obesity, unspecified: Secondary | ICD-10-CM

## 2020-06-15 DIAGNOSIS — R569 Unspecified convulsions: Secondary | ICD-10-CM

## 2020-06-15 DIAGNOSIS — E559 Vitamin D deficiency, unspecified: Secondary | ICD-10-CM

## 2020-06-16 ENCOUNTER — Other Ambulatory Visit: Payer: Self-pay

## 2020-06-16 ENCOUNTER — Other Ambulatory Visit: Payer: Medicare HMO

## 2020-06-16 DIAGNOSIS — E7800 Pure hypercholesterolemia, unspecified: Secondary | ICD-10-CM | POA: Diagnosis not present

## 2020-06-16 DIAGNOSIS — Z0000 Encounter for general adult medical examination without abnormal findings: Secondary | ICD-10-CM | POA: Diagnosis not present

## 2020-06-16 DIAGNOSIS — E669 Obesity, unspecified: Secondary | ICD-10-CM | POA: Diagnosis not present

## 2020-06-16 DIAGNOSIS — R569 Unspecified convulsions: Secondary | ICD-10-CM | POA: Diagnosis not present

## 2020-06-16 DIAGNOSIS — E559 Vitamin D deficiency, unspecified: Secondary | ICD-10-CM | POA: Diagnosis not present

## 2020-06-16 DIAGNOSIS — F3341 Major depressive disorder, recurrent, in partial remission: Secondary | ICD-10-CM | POA: Diagnosis not present

## 2020-06-16 DIAGNOSIS — R7309 Other abnormal glucose: Secondary | ICD-10-CM | POA: Diagnosis not present

## 2020-06-17 LAB — CBC WITH DIFFERENTIAL/PLATELET
Absolute Monocytes: 531 cells/uL (ref 200–950)
Basophils Absolute: 83 cells/uL (ref 0–200)
Basophils Relative: 1 %
Eosinophils Absolute: 465 cells/uL (ref 15–500)
Eosinophils Relative: 5.6 %
HCT: 38.6 % (ref 35.0–45.0)
Hemoglobin: 12.5 g/dL (ref 11.7–15.5)
Lymphs Abs: 2822 cells/uL (ref 850–3900)
MCH: 32 pg (ref 27.0–33.0)
MCHC: 32.4 g/dL (ref 32.0–36.0)
MCV: 98.7 fL (ref 80.0–100.0)
MPV: 9.4 fL (ref 7.5–12.5)
Monocytes Relative: 6.4 %
Neutro Abs: 4399 cells/uL (ref 1500–7800)
Neutrophils Relative %: 53 %
Platelets: 270 10*3/uL (ref 140–400)
RBC: 3.91 10*6/uL (ref 3.80–5.10)
RDW: 12.1 % (ref 11.0–15.0)
Total Lymphocyte: 34 %
WBC: 8.3 10*3/uL (ref 3.8–10.8)

## 2020-06-17 LAB — COMPLETE METABOLIC PANEL WITH GFR
AG Ratio: 1.6 (calc) (ref 1.0–2.5)
ALT: 9 U/L (ref 6–29)
AST: 13 U/L (ref 10–35)
Albumin: 3.6 g/dL (ref 3.6–5.1)
Alkaline phosphatase (APISO): 63 U/L (ref 37–153)
BUN/Creatinine Ratio: 13 (calc) (ref 6–22)
BUN: 13 mg/dL (ref 7–25)
CO2: 27 mmol/L (ref 20–32)
Calcium: 8.8 mg/dL (ref 8.6–10.4)
Chloride: 109 mmol/L (ref 98–110)
Creat: 1 mg/dL — ABNORMAL HIGH (ref 0.50–0.99)
GFR, Est African American: 69 mL/min/{1.73_m2} (ref >=60)
GFR, Est Non African American: 60 mL/min/{1.73_m2} (ref >=60)
Globulin: 2.2 g/dL (calc) (ref 1.9–3.7)
Glucose, Bld: 86 mg/dL (ref 65–99)
Potassium: 3.8 mmol/L (ref 3.5–5.3)
Sodium: 143 mmol/L (ref 135–146)
Total Bilirubin: 0.5 mg/dL (ref 0.2–1.2)
Total Protein: 5.8 g/dL — ABNORMAL LOW (ref 6.1–8.1)

## 2020-06-17 LAB — LIPID PANEL
Cholesterol: 144 mg/dL (ref <200)
HDL: 59 mg/dL (ref >=50)
LDL Cholesterol (Calc): 68 mg/dL (calc)
Non-HDL Cholesterol (Calc): 85 mg/dL (calc) (ref <130)
Total CHOL/HDL Ratio: 2.4 (calc) (ref <5.0)
Triglycerides: 89 mg/dL (ref <150)

## 2020-06-17 LAB — HEMOGLOBIN A1C
Hgb A1c MFr Bld: 5 % of total Hgb (ref <5.7)
Mean Plasma Glucose: 97 (calc)
eAG (mmol/L): 5.4 (calc)

## 2020-06-17 LAB — TSH: TSH: 2.76 mIU/L (ref 0.40–4.50)

## 2020-06-17 LAB — VITAMIN D 25 HYDROXY (VIT D DEFICIENCY, FRACTURES): Vit D, 25-Hydroxy: 44 ng/mL (ref 30–100)

## 2020-06-21 ENCOUNTER — Other Ambulatory Visit: Payer: Self-pay

## 2020-06-21 ENCOUNTER — Other Ambulatory Visit: Payer: Self-pay | Admitting: Family Medicine

## 2020-06-21 ENCOUNTER — Encounter: Payer: Self-pay | Admitting: Family Medicine

## 2020-06-21 ENCOUNTER — Ambulatory Visit (INDEPENDENT_AMBULATORY_CARE_PROVIDER_SITE_OTHER): Payer: Medicare HMO | Admitting: Family Medicine

## 2020-06-21 VITALS — BP 105/69 | HR 91 | Temp 97.5°F | Resp 16 | Ht 63.0 in | Wt 211.6 lb

## 2020-06-21 DIAGNOSIS — Z1231 Encounter for screening mammogram for malignant neoplasm of breast: Secondary | ICD-10-CM

## 2020-06-21 DIAGNOSIS — Z23 Encounter for immunization: Secondary | ICD-10-CM

## 2020-06-21 DIAGNOSIS — R6889 Other general symptoms and signs: Secondary | ICD-10-CM | POA: Diagnosis not present

## 2020-06-21 DIAGNOSIS — R569 Unspecified convulsions: Secondary | ICD-10-CM

## 2020-06-21 DIAGNOSIS — F5104 Psychophysiologic insomnia: Secondary | ICD-10-CM

## 2020-06-21 DIAGNOSIS — R7309 Other abnormal glucose: Secondary | ICD-10-CM

## 2020-06-21 DIAGNOSIS — Z0000 Encounter for general adult medical examination without abnormal findings: Secondary | ICD-10-CM

## 2020-06-21 DIAGNOSIS — E7800 Pure hypercholesterolemia, unspecified: Secondary | ICD-10-CM

## 2020-06-21 DIAGNOSIS — F3341 Major depressive disorder, recurrent, in partial remission: Secondary | ICD-10-CM

## 2020-06-21 DIAGNOSIS — I6930 Unspecified sequelae of cerebral infarction: Secondary | ICD-10-CM

## 2020-06-21 DIAGNOSIS — N182 Chronic kidney disease, stage 2 (mild): Secondary | ICD-10-CM

## 2020-06-21 DIAGNOSIS — E669 Obesity, unspecified: Secondary | ICD-10-CM

## 2020-06-21 DIAGNOSIS — E559 Vitamin D deficiency, unspecified: Secondary | ICD-10-CM

## 2020-06-21 DIAGNOSIS — E538 Deficiency of other specified B group vitamins: Secondary | ICD-10-CM

## 2020-06-21 NOTE — Assessment & Plan Note (Signed)
Controlled, stable major depression in partial remission See PHQ Discussion on meds - goal to continue current course On fluoxetine 30mg  daily, no change today

## 2020-06-21 NOTE — Assessment & Plan Note (Signed)
Encourage weight loss lifestyle diet exercise Weight down 5 lbs in 1 year

## 2020-06-21 NOTE — Progress Notes (Signed)
Subjective:    Patient ID: [name], female    DOB: 07/02/1956, 64 y.o.   MRN: [id number]  [name] is a 64 y.o. female presenting on 06/21/2020 for Annual Exam   HPI  Here for Annual Physical and Lab Review.  Admits mild memory loss or forgetfulness Describes this is often forgetting name of person or place, or brief word finding, usually quick issue and not impacting her daily function. Occasionally misplace something. No other symptoms or concerns at this time.  Elevated Creatinine / CKD-II No history of hypertension.  Prior review had mild AKI in past due to hospitalization. Lab shows Cr down to 1.00 improved No new concerns. Not on NSAID. Tries to improve hydration  HYPERLIPIDEMIA: Last lipid panel 06/2020, major improved LDL 187 down to 68 on statin - taking Rosuvastatin 10mg  nightly without side effects, tolerating well. - She has history of hemorrhagic stroke, not ischemic stroke and no MI - She takes ASA 81mg  daily with minor bruise symptoms but no bleeding complication by report  History ofSAH /CVAwith residual deficit / Memory Loss / OSA / Seizure Disorder [facility] -Background on onsetproblem 12/01/16, had acute episode, taken to [facility], dx SAHthen taken to [facility]. Had CVA history of aneurysm. Had complication with seizure. - Overall has normal motor symptoms, coordination and strength and ambulation intact - Continues on Keppra 750 BID, no further seizures - She would like to switch to local Neurology for her follow-up in future, not quite ready yet  Tremors, bilateral hands She is Right hand dominant. Reports bilateral hands episodic tremors, mostly Left but also R at times. Seems to be worse with movements or carrying or lifting. Reported episode occurred after doing repetitive activities. She says often if weeding in garden for while can have more pronounced tremor if lifting or doing  something. - Denies any resting tremor  Major Depression, recurrent in partial remission Seizure Disorder Followed by [facility] yearly. Her Keppra has been adjusted to 3 at night with improved sleep Less sedation during day  OSA no longer on CPAP, ineffective.   Health Maintenance:  Colon CA Screening: Last Colonoscopy (done by [facility]), results with polyps benign, repeat in 5 years. Currently asymptomatic. Known fam history of colon CA paternal grandmother. Due in 2021 - consider cologuard vs colonoscopy  Cervical CA Screening - s/p total hysterectomy. Not due for further pap smears.  Due for Flu Shot, will receive today   Breast CA Screening: Due for mammogram screening. Last mammogram result 02/22/27 (negative) done at [facility]. No prior history abnormal mammogram. No known family history of breast cancer. Currently asymptomatic. Request order.  Depression screen [facility] 2/9 06/21/2020 05/05/2020 06/20/2019  Decreased Interest 1 1 0  Down, Depressed, Hopeless 1 2 0  PHQ - 2 Score 2 3 0  Altered sleeping 2 1 1   Tired, decreased energy 1 1 1   Change in appetite 0 1 0  Feeling bad or failure about yourself  0 1 0  Trouble concentrating 2 2 0  Moving slowly or fidgety/restless 0 1 0  Suicidal thoughts 1 1 0  PHQ-9 Score 8 11 2   Difficult doing work/chores Somewhat difficult Somewhat difficult Not difficult at all   [place]-Suicide Severity Rating Scale 1) Have you wished you were dead or wished you could go to sleep and not wake up? - Yes  2) Have you had any actual thoughts of killing yourself? - No  Skip  questions 3,4, 5  6) Have you ever done anything, started to do anything, or prepared to do anything to end your life? - No   GAD 7 : Generalized Anxiety Score 06/21/2020 05/05/2020 05/22/2018  Nervous, Anxious, on Edge 1 1 1   Control/stop worrying 1 1 0  Worry too much - different things 0 0 0  Trouble relaxing 1 1 0  Restless 1 0 0   Easily annoyed or irritable 1 0 0  Afraid - awful might happen 2 2 1   Total GAD 7 Score 7 5 2   Anxiety Difficulty Not difficult at all Somewhat difficult Not difficult at all      Past Medical History:  Diagnosis Date  . Anxiety   . Arthritis    left hip  . GERD (gastroesophageal reflux disease)   . Hepatitis 1972   hep B  . Memory disorder 09/21/2017  . OSA (obstructive sleep apnea) 05/08/2018  . PONV (postoperative nausea and vomiting)   . Seizures ([place])    Past Surgical History:  Procedure Laterality Date  . ABDOMINAL HYSTERECTOMY  04/1999  . ARTHROSCOPIC REPAIR ACL     bilateral knees  . BREAST EXCISIONAL BIOPSY Right 2008   NEG  . BREAST EXCISIONAL BIOPSY Left YRS AGO   X2 - NEG  . BREAST LUMPECTOMY  06/1979; 03/1995   left  . CHOLECYSTECTOMY  09/1987  . LAPAROSCOPIC GASTRIC SLEEVE RESECTION  09/30/2012   Procedure: LAPAROSCOPIC GASTRIC SLEEVE RESECTION;  Surgeon: Madilyn Hook, DO;  Location: [facility];  Service: General;  Laterality: N/A;  . TOTAL HIP ARTHROPLASTY  11/13/2012   left  . UPPER GI ENDOSCOPY  09/30/2012   Procedure: UPPER GI ENDOSCOPY;  Surgeon: Madilyn Hook, DO;  Location: [facility];  Service: General;;   Social History   Socioeconomic History  . Marital status: Married    Spouse name: Not on file  . Number of children: 0  . Years of education: BS  . Highest education level: Not on file  Occupational History  . Not on file  Tobacco Use  . Smoking status: Former Smoker    Packs/day: 0.25    Years: 5.00    Pack years: 1.25    Types: Cigarettes    Quit date: 07/25/2001    Years since quitting: 18.9  . Smokeless tobacco: Former Network engineer and Sexual Activity  . Alcohol use: Yes    Alcohol/week: 0.0 standard drinks    Comment: 2 glasses of wine per week  . Drug use: No  . Sexual activity: Not on file  Other Topics Concern  . Not on file  Social History Narrative   Lives with spouse   Caffeine use: Drinks coffee daily   Right handed    Social Determinants of Health   Financial Resource Strain:   . Difficulty of Paying Living Expenses: Not on file  Food Insecurity:   . Worried About Charity fundraiser in the Last Year: Not on file  . Ran Out of Food in the Last Year: Not on file  Transportation Needs:   . Lack of Transportation (Medical): Not on file  . Lack of Transportation (Non-Medical): Not on file  Physical Activity:   . Days of Exercise per Week: Not on file  . Minutes of Exercise per Session: Not on file  Stress:   . Feeling of Stress : Not on file  Social Connections:   . Frequency of Communication with Friends and Family: Not on file  . Frequency  of Social Gatherings with Friends and Family: Not on file  . Attends Religious Services: Not on file  . Active Member of Clubs or Organizations: Not on file  . Attends Archivist Meetings: Not on file  . Marital Status: Not on file  Intimate Partner Violence:   . Fear of Current or Ex-Partner: Not on file  . Emotionally Abused: Not on file  . Physically Abused: Not on file  . Sexually Abused: Not on file   Family History  Problem Relation Age of Onset  . Hyperlipidemia Mother   . Obesity Mother   . Pulmonary fibrosis Mother   . Cancer Maternal Aunt        ovarian cancer  . Cancer Father   . Colon cancer Paternal Grandmother   . Esophageal cancer Neg Hx   . Stomach cancer Neg Hx   . Rectal cancer Neg Hx   . Breast cancer Neg Hx    Current Outpatient Medications on File Prior to Visit  Medication Sig  . acetaminophen (TYLENOL) 500 MG tablet Take 2 tablets (1,000 mg total) by mouth every 6 (six) hours as needed for mild pain or moderate pain.  Marland Kitchen aspirin EC 81 MG tablet Take 81 mg by mouth daily.  . B Complex Vitamins (VITAMIN-B COMPLEX PO) Take by mouth daily.  Marland Kitchen FLUoxetine (PROZAC) 10 MG capsule Take 1 capsule (10 mg total) by mouth daily. Take with other dose of Fluoxetine 20mg  daily = total daily dose 30mg .  . FLUoxetine (PROZAC) 20 MG  capsule Take 1 capsule (20 mg total) by mouth daily. Take with other dose of Fluoxetine 10mg  daily = total daily dose 30mg .  . levETIRAcetam (KEPPRA XR) 500 MG 24 hr tablet Take 3 tablets (1,500 mg total) by mouth at bedtime.  . Multiple Vitamin (MULTIVITAMIN WITH MINERALS) TABS Take 1 tablet by mouth daily.   . Omega-3 Fatty Acids (FISH OIL) 1200 MG CAPS Take 1 capsule by mouth 2 (two) times daily.  . rosuvastatin (CRESTOR) 10 MG tablet Take 1 tablet (10 mg total) by mouth at bedtime.  Marland Kitchen VITAMIN D, CHOLECALCIFEROL, PO Take by mouth daily.  . Melatonin 3 MG TABS Take 6 mg by mouth at bedtime. (Patient not taking: Reported on 06/21/2020)   No current facility-administered medications on file prior to visit.    Review of Systems  Constitutional: Negative for activity change, appetite change, chills, diaphoresis, fatigue and fever.  HENT: Negative for congestion and hearing loss.   Eyes: Negative for visual disturbance.  Respiratory: Negative for apnea, cough, chest tightness, shortness of breath and wheezing.   Cardiovascular: Negative for chest pain, palpitations and leg swelling.  Gastrointestinal: Negative for abdominal pain, anal bleeding, blood in stool, constipation, diarrhea, nausea and vomiting.  Endocrine: Negative for cold intolerance.  Genitourinary: Negative for difficulty urinating, dyspareunia, dysuria, frequency and hematuria.  Musculoskeletal: Negative for arthralgias, back pain and neck pain.  Skin: Negative for rash.  Allergic/Immunologic: Negative for environmental allergies.  Neurological: Negative for dizziness, weakness, light-headedness, numbness and headaches.  Hematological: Negative for adenopathy.  Psychiatric/Behavioral: Negative for behavioral problems, dysphoric mood and sleep disturbance. The patient is not nervous/anxious.    Per HPI unless specifically indicated above      Objective:    BP 105/69   Pulse 91   Temp (!) 97.5 F (36.4 C) (Temporal)    Resp 16   Ht 5\' 3"  (1.6 m)   Wt 211 lb 9.6 oz (96 kg)   SpO2 97%  BMI 37.48 kg/m   Wt Readings from Last 3 Encounters:  06/21/20 211 lb 9.6 oz (96 kg)  05/05/20 (!) 211 lb 9.6 oz (96 kg)  10/07/19 216 lb 12.8 oz (98.3 kg)    Physical Exam Vitals and nursing note reviewed.  Constitutional:      General: She is not in acute distress.    Appearance: She is well-developed. She is not diaphoretic.     Comments: Well-appearing, comfortable, cooperative  HENT:     Head: Normocephalic and atraumatic.  Eyes:     General:        Right eye: No discharge.        Left eye: No discharge.     Conjunctiva/sclera: Conjunctivae normal.     Pupils: Pupils are equal, round, and reactive to light.  Neck:     Thyroid: No thyromegaly.  Cardiovascular:     Rate and Rhythm: Normal rate and regular rhythm.     Heart sounds: Normal heart sounds. No murmur heard.   Pulmonary:     Effort: Pulmonary effort is normal. No respiratory distress.     Breath sounds: Normal breath sounds. No wheezing or rales.  Abdominal:     General: Bowel sounds are normal. There is no distension.     Palpations: Abdomen is soft. There is no mass.     Tenderness: There is no abdominal tenderness.  Musculoskeletal:        General: No tenderness. Normal range of motion.     Cervical back: Normal range of motion and neck supple.     Right lower leg: No edema.     Left lower leg: No edema.     Comments: Upper / Lower Extremities: - Normal muscle tone, strength bilateral upper extremities 5/5, lower extremities 5/5  Lymphadenopathy:     Cervical: No cervical adenopathy.  Skin:    General: Skin is warm and dry.     Findings: No erythema or rash.  Neurological:     Mental Status: She is alert and oriented to person, place, and time.     Comments: Distal sensation intact to light touch all extremities  Psychiatric:        Behavior: Behavior normal.     Comments: Well groomed, good eye contact, normal speech and thoughts        Results for orders placed or performed in visit on 06/15/20  VITAMIN D 25 Hydroxy (Vit-D Deficiency, Fractures)  Result Value Ref Range   Vit D, 25-Hydroxy 44 30 - 100 ng/mL  TSH  Result Value Ref Range   TSH 2.76 0.40 - 4.50 mIU/L  Lipid panel  Result Value Ref Range   Cholesterol 144 <200 mg/dL   HDL 59 > OR = 50 mg/dL   Triglycerides 89 <150 mg/dL   LDL Cholesterol (Calc) 68 mg/dL (calc)   Total CHOL/HDL Ratio 2.4 <5.0 (calc)   Non-HDL Cholesterol (Calc) 85 <130 mg/dL (calc)  COMPLETE METABOLIC PANEL WITH GFR  Result Value Ref Range   Glucose, Bld 86 65 - 99 mg/dL   BUN 13 7 - 25 mg/dL   Creat 1.00 (H) 0.50 - 0.99 mg/dL   GFR, Est Non African American 60 > OR = 60 mL/min/1.67m2   GFR, Est African American 69 > OR = 60 mL/min/1.59m2   BUN/Creatinine Ratio 13 6 - 22 (calc)   Sodium 143 135 - 146 mmol/L   Potassium 3.8 3.5 - 5.3 mmol/L   Chloride 109 98 - 110 mmol/L   CO2  27 20 - 32 mmol/L   Calcium 8.8 8.6 - 10.4 mg/dL   Total Protein 5.8 (L) 6.1 - 8.1 g/dL   Albumin 3.6 3.6 - 5.1 g/dL   Globulin 2.2 1.9 - 3.7 g/dL (calc)   AG Ratio 1.6 1.0 - 2.5 (calc)   Total Bilirubin 0.5 0.2 - 1.2 mg/dL   Alkaline phosphatase (APISO) 63 37 - 153 U/L   AST 13 10 - 35 U/L   ALT 9 6 - 29 U/L  CBC with Differential/Platelet  Result Value Ref Range   WBC 8.3 3.8 - 10.8 Thousand/uL   RBC 3.91 3.80 - 5.10 Million/uL   Hemoglobin 12.5 11.7 - 15.5 g/dL   HCT 38.6 35 - 45 %   MCV 98.7 80.0 - 100.0 fL   MCH 32.0 27.0 - 33.0 pg   MCHC 32.4 32.0 - 36.0 g/dL   RDW 12.1 11.0 - 15.0 %   Platelets 270 140 - 400 Thousand/uL   MPV 9.4 7.5 - 12.5 fL   Neutro Abs 4,399 1,500 - 7,800 cells/uL   Lymphs Abs 2,822 850 - 3,900 cells/uL   Absolute Monocytes 531 200 - 950 cells/uL   Eosinophils Absolute 465 15 - 500 cells/uL   Basophils Absolute 83 0 - 200 cells/uL   Neutrophils Relative % 53 %   Total Lymphocyte 34.0 %   Monocytes Relative 6.4 %   Eosinophils Relative 5.6 %   Basophils  Relative 1.0 %  Hemoglobin A1c  Result Value Ref Range   Hgb A1c MFr Bld 5.0 <5.7 % of total Hgb   Mean Plasma Glucose 97 (calc)   eAG (mmol/L) 5.4 (calc)      Assessment & Plan:   Problem List Items Addressed This Visit    Seizures (HCC)    No breakthrough seizures [facility] by [facility] CVA in past On Keppra      Screening for breast cancer   Relevant Orders   MM 3D SCREEN BREAST BILATERAL   Recurrent major depression in partial remission (HCC)    Controlled, stable major depression in partial remission See PHQ Discussion on meds - goal to continue current course On fluoxetine 30mg  daily, no change today      Obesity (BMI 35.0-39.9 without comorbidity)    Encourage weight loss lifestyle diet exercise Weight down 5 lbs in 1 year      Insomnia    Persistent issue Had improved in past, now has some issues Restart Melatonin, Sleep hygiene handout She takes her sedating meds at night, can help her sleep Follow up if unresolved      Hyperlipidemia    Dramatic improvement, LDL down from 187 to 68, with Rosuvastatin 10mg  daily 1 year ago Last lipid 06/2020 History of CVA  Plan: 1. Continue Rosuvastatin 10mg  nightly - counseling on medication benefit risk prevention of ASCVD potential side effect 2. Continue ASA 81mg  for primary ASCVD risk reduction - future can reduce med if on statin 3. Encourage improved lifestyle - low carb/cholesterol, reduce portion size, continue improving regular exercise        Other Visit Diagnoses    Annual physical exam    -  Primary   Needs flu shot       Relevant Orders   Flu Vaccine QUAD 36+ mos IM (Completed)   Forgetfulness          Updated Health Maintenance information - Due mammogram, last done 02/2017, overdue. Will place order for [facility],. She will schedule. -  next due Colonoscopy 07/2020, she will contact her GI Dr Hilarie Fredrickson office to schedule, last done 5 years ago - Flu Shot today Reviewed  recent lab results with patient Encouraged improvement to lifestyle with diet and exercise - Goal of weight loss  #Forgetfulness vs Memory Loss Minor problem as described, not affecting significant function May be normal for age, and her vascular history prior CVA Will check labs next year including thyroid, B12 Reassurance, goal to stay active, mental stimulation  No orders of the defined types were placed in this encounter.     Follow up plan: Return in about 1 year (around 06/21/2021) for Annual Physical.  Future labs in for 06/16/21  Nobie Putnam, [facility] 06/21/2020, 10:09 AM

## 2020-06-21 NOTE — Assessment & Plan Note (Signed)
No breakthrough seizures [facility] by [facility] CVA in past On Keppra

## 2020-06-21 NOTE — Patient Instructions (Addendum)
Thank you for coming to the office today.  Try to increase dietary protein.   -----------------------------------------------------  Insomnia Try to reset sleep cycle Avoid excess napping during the day May re try melatonin - natural supplement to help boost sleep at the right time, up to 5mg  or max is 10mg  before bed.  Sleep Hygiene Recommendations to promote healthy sleep in all patients, especially if symptoms of insomnia are worsening. Due to the nature of sleep rhythms, if your body gets "out of rhythm", it may take some time before your sleep cycle can be "reset".  Please try to follow as many of the following tips as you can, usually there are only a few of these are the primary cause of the problem.  ?To reset your sleep rhythm, go to bed and get up at the same time every day ?Sleep only long enough to feel rested and then get out of bed ?Do not try to force yourself to sleep. If you can't sleep, get out of bed and try again later. ?Avoid naps during the day, unless excessively tired. The more sleeping during the day, then the less sleep your body needs at night.  ?Have coffee, tea, and other foods that have caffeine only in the morning ?Exercise several days a week, but not right before bed ?If you drink alcohol, prefer to have appropriate drink with one meal, but prefer to avoid alcohol in the evening, and bedtime ?If you smoke, avoid smoking, especially in the evening  ?Avoid watching TV or looking at phones, computers, or reading devices ("e-books") that give off light at least 30 minutes before bed. This artificial light sends "awake signals" to your brain and can make it harder to fall asleep. ?Make your bedroom a comfortable place where it is easy to fall asleep: ? Put up shades or special blackout curtains to block light from outside. ? Use a white noise machine to block noise. ? Keep the temperature cool. ?Try your best to solve or at least address your problems before  you go to bed ?Use relaxation techniques to manage stress. Ask your health care provider to suggest some techniques that may work well for you. These may include: ? Breathing exercises. ? Routines to release muscle tension. ? Visualizing peaceful scenes. -------------------------------------   For Mammogram screening for breast cancer   Call the [facility] below anytime to schedule your own appointment now that order has been placed.  [facility] [place], [place] [address] Phone: [phone]  ------------------------------------------------------  Call Dr Vena Rua office, [facility]. Address: [address], [place], [place] [address] Phone: [phone]  Last colonoscopy done 07/22/2015 - next due in 5 years, since had benign polyps, and fam history of colon CA.  Please see if they recommend to repeat Colonoscopy now or other option like Cologuard.   DUE for FASTING BLOOD WORK (no food or drink after midnight before the lab appointment, only water or coffee without cream/sugar on the morning of)  SCHEDULE "Lab Only" visit in the morning at the clinic for lab draw in 1 YEAR  - Make sure Lab Only appointment is at about 1 week before your next appointment, so that results will be available  For Lab Results, once available within 2-3 days of blood draw, you can can log in to MyChart online to view your results and a brief explanation. Also, we can discuss results at next follow-up visit.  Please schedule a Follow-up Appointment to: Return in about 1 year (around 06/21/2021) for Annual Physical.  If you have any other questions or concerns, please feel free to call the office or send a message through [facility]. You may also schedule an earlier appointment if necessary.  Additionally, you may be receiving a survey about your experience at our office within a few days to 1 week by e-mail or  mail. We value your feedback.  Nobie Putnam, DO [place]

## 2020-06-21 NOTE — Assessment & Plan Note (Signed)
Dramatic improvement, LDL down from 187 to 68, with Rosuvastatin 10mg  daily 1 year ago Last lipid 06/2020 History of CVA  Plan: 1. Continue Rosuvastatin 10mg  nightly - counseling on medication benefit risk prevention of ASCVD potential side effect 2. Continue ASA 81mg  for primary ASCVD risk reduction - future can reduce med if on statin 3. Encourage improved lifestyle - low carb/cholesterol, reduce portion size, continue improving regular exercise

## 2020-06-21 NOTE — Assessment & Plan Note (Signed)
Persistent issue Had improved in past, now has some issues Restart Melatonin, Sleep hygiene handout She takes her sedating meds at night, can help her sleep Follow up if unresolved

## 2020-06-24 ENCOUNTER — Other Ambulatory Visit: Payer: Self-pay | Admitting: Family Medicine

## 2020-06-24 DIAGNOSIS — E7800 Pure hypercholesterolemia, unspecified: Secondary | ICD-10-CM

## 2020-08-19 ENCOUNTER — Encounter: Payer: Self-pay | Admitting: Family Medicine

## 2020-08-19 ENCOUNTER — Other Ambulatory Visit: Payer: Self-pay

## 2020-08-19 ENCOUNTER — Telehealth (INDEPENDENT_AMBULATORY_CARE_PROVIDER_SITE_OTHER): Payer: Medicare HMO | Admitting: Family Medicine

## 2020-08-19 VITALS — Temp 97.7°F | Ht 63.0 in | Wt 211.0 lb

## 2020-08-19 DIAGNOSIS — J40 Bronchitis, not specified as acute or chronic: Secondary | ICD-10-CM | POA: Diagnosis not present

## 2020-08-19 MED ORDER — PREDNISONE 50 MG PO TABS: ORAL_TABLET | ORAL | 0 refills | Status: DC

## 2020-08-19 MED ORDER — PROMETHAZINE-DM 6.25-15 MG/5ML PO SYRP: 5.0000 mL | ORAL_SOLUTION | Freq: Four times a day (QID) | ORAL | 0 refills | Status: DC | PRN

## 2020-08-19 MED ORDER — BENZONATATE 100 MG PO CAPS: 100.0000 mg | ORAL_CAPSULE | Freq: Two times a day (BID) | ORAL | 0 refills | Status: DC | PRN

## 2020-08-19 MED ORDER — ALBUTEROL SULFATE HFA 108 (90 BASE) MCG/ACT IN AERS: 2.0000 | INHALATION_SPRAY | Freq: Four times a day (QID) | RESPIRATORY_TRACT | 0 refills | Status: DC | PRN

## 2020-08-19 NOTE — Assessment & Plan Note (Signed)
Bronchitis, without hx of asthma or COPD.  Will treat with prednisone 50mg  daily x 5 days, albuterol inhaler 1-2 puffs every 4-6 hours as needed for cough, SOB or wheezing.  To begin tessalon perles, 1 perle 2x per day as needed for cough and promethazine DM 90mL every 6 hours as needed for cough.  To not drive or operate heavy machinery while taking this medication, as it can cause sedation.  Strict ER precautions.  Plan: 1. Begin prednisone 50mg  daily x 5 days 2. Begin tessalon perles, 1 perle BID PRN for cough 3. Begin promethazine DM 40mL every 6 hours as needed for cough 4. Can use albuterol inhaler 1-2 puffs every 4-6 hours as needed for cough, SOB and/or wheezing 5. Strict ER precautions 6. RTC PRN

## 2020-08-19 NOTE — Progress Notes (Signed)
Virtual Visit via Telephone  The purpose of this virtual visit is to provide medical care while limiting exposure to the novel coronavirus (COVID19) for both patient and office staff.  Consent was obtained for phone visit:  Yes.   Answered questions that patient had about telehealth interaction:  Yes.   I discussed the limitations, risks, security and privacy concerns of performing an evaluation and management service by telephone. I also discussed with the patient that there may be a patient responsible charge related to this service. The patient expressed understanding and agreed to proceed.  Patient is at home and is accessed via telephone Services are provided by Harlin Rain, FNP-C from [facility])  ---------------------------------------------------------------------- Chief Complaint  Patient presents with  . Cough    X2 days, congestion, no fever, pt has chills,    S: Reviewed CMA documentation. I have called patient and gathered additional HPI as follows:  [name] presents for virtual telemedicine visit via telephone for concerns of non-productive cough x 2 days with chest congestion, chills.  Started on Tuesday morning as a sinus congestion but has moved to the chest.  Denies fevers, sore throat, changes in taste/smell, body aches, SOB, DOE, CP, abdominal pain, n/v/d.  Has been vaccinated for COVID.  Has not had COVID testing completed  Patient is currently home Denies any high risk travel to areas of current concern for COVID19. Denies any known or suspected exposure to person with or possibly with COVID19.  Past Medical History:  Diagnosis Date  . Anxiety   . Arthritis    left hip  . GERD (gastroesophageal reflux disease)   . Hepatitis 1972   hep B  . Memory disorder 09/21/2017  . OSA (obstructive sleep apnea) 05/08/2018  . PONV (postoperative nausea and vomiting)   . Seizures ([place])    Social History   Tobacco Use  . Smoking status:  Former Smoker    Packs/day: 0.25    Years: 5.00    Pack years: 1.25    Types: Cigarettes    Quit date: 07/25/2001    Years since quitting: 19.0  . Smokeless tobacco: Former Network engineer Use Topics  . Alcohol use: Yes    Alcohol/week: 0.0 standard drinks    Comment: 2 glasses of wine per week  . Drug use: No    Current Outpatient Medications:  .  acetaminophen (TYLENOL) 500 MG tablet, Take 2 tablets (1,000 mg total) by mouth every 6 (six) hours as needed for mild pain or moderate pain., Disp: 30 tablet, Rfl: 0 .  amoxicillin (AMOXIL) 500 MG capsule, Take 500 mg by mouth 3 (three) times daily., Disp: , Rfl:  .  aspirin EC 81 MG tablet, Take 81 mg by mouth daily., Disp: , Rfl:  .  B Complex Vitamins (VITAMIN-B COMPLEX PO), Take by mouth daily., Disp: , Rfl:  .  FLUoxetine (PROZAC) 10 MG capsule, Take 1 capsule (10 mg total) by mouth daily. Take with other dose of Fluoxetine 20mg  daily = total daily dose 30mg ., Disp: 90 capsule, Rfl: 1 .  FLUoxetine (PROZAC) 20 MG capsule, Take 1 capsule (20 mg total) by mouth daily. Take with other dose of Fluoxetine 10mg  daily = total daily dose 30mg ., Disp: 90 capsule, Rfl: 1 .  levETIRAcetam (KEPPRA XR) 500 MG 24 hr tablet, Take 3 tablets (1,500 mg total) by mouth at bedtime., Disp: 270 tablet, Rfl: 1 .  Melatonin 3 MG TABS, Take 6 mg by mouth at bedtime. ,  Disp: , Rfl:  .  Multiple Vitamin (MULTIVITAMIN WITH MINERALS) TABS, Take 1 tablet by mouth daily. , Disp: , Rfl:  .  Omega-3 Fatty Acids (FISH OIL) 1200 MG CAPS, Take 1 capsule by mouth 2 (two) times daily., Disp: , Rfl:  .  rosuvastatin (CRESTOR) 10 MG tablet, TAKE 1 TABLET (10 MG TOTAL) BY MOUTH AT BEDTIME., Disp: 90 tablet, Rfl: 3 .  VITAMIN D, CHOLECALCIFEROL, PO, Take by mouth daily., Disp: , Rfl:  .  albuterol (VENTOLIN HFA) 108 (90 Base) MCG/ACT inhaler, Inhale 2 puffs into the lungs every 6 (six) hours as needed for wheezing or shortness of breath., Disp: 8 g, Rfl: 0 .  benzonatate  (TESSALON) 100 MG capsule, Take 1 capsule (100 mg total) by mouth 2 (two) times daily as needed for cough., Disp: 20 capsule, Rfl: 0 .  predniSONE (DELTASONE) 50 MG tablet, Take 1 tablet daily x 5 days, Disp: 5 tablet, Rfl: 0 .  promethazine-dextromethorphan (PROMETHAZINE-DM) 6.25-15 MG/5ML syrup, Take 5 mLs by mouth 4 (four) times daily as needed for cough., Disp: 118 mL, Rfl: 0  Depression screen [facility] 2/9 08/19/2020 06/21/2020 05/05/2020  Decreased Interest 0 1 1  Down, Depressed, Hopeless 0 1 2  PHQ - 2 Score 0 2 3  Altered sleeping 0 2 1  Tired, decreased energy 0 1 1  Change in appetite 0 0 1  Feeling bad or failure about yourself  0 0 1  Trouble concentrating 0 2 2  Moving slowly or fidgety/restless 0 0 1  Suicidal thoughts 0 1 1  PHQ-9 Score 0 8 11  Difficult doing work/chores - Somewhat difficult Somewhat difficult    GAD 7 : Generalized Anxiety Score 08/19/2020 06/21/2020 05/05/2020 05/22/2018  Nervous, Anxious, on Edge 0 1 1 1   Control/stop worrying 0 1 1 0  Worry too much - different things 0 0 0 0  Trouble relaxing 0 1 1 0  Restless 0 1 0 0  Easily annoyed or irritable 0 1 0 0  Afraid - awful might happen 0 2 2 1   Total GAD 7 Score 0 7 5 2   Anxiety Difficulty - Not difficult at all Somewhat difficult Not difficult at all    -------------------------------------------------------------------------- O: No physical exam performed due to remote telephone encounter.  Physical Exam: Patient remotely monitored without video.  Verbal communication appropriate.  Cognition normal.  Recent Results (from the past 2160 hour(s))  VITAMIN D 25 Hydroxy (Vit-D Deficiency, Fractures)     Status: None   Collection Time: 06/16/20  9:44 AM  Result Value Ref Range   Vit D, 25-Hydroxy 44 30 - 100 ng/mL    Comment: Vitamin D Status         25-OH Vitamin D: . Deficiency:                    <20 ng/mL Insufficiency:             20 - 29 ng/mL Optimal:                 > or = 30 ng/mL . For  25-OH Vitamin D testing on patients on  D2-supplementation and patients for whom quantitation  of D2 and D3 fractions is required, the [facility]D(TM) 25-OH VIT D, (D2,D3), LC/MS/MS is recommended: order  code [phone] (patients >70yrs). See Note 1 . Note 1 . For additional information, please refer to  http://education.[facility].com/faq/FAQ199  (This link is being provided for informational/ educational purposes only.)  TSH     Status: None   Collection Time: 06/16/20  9:44 AM  Result Value Ref Range   TSH 2.76 0.40 - 4.50 mIU/L  Lipid panel     Status: None   Collection Time: 06/16/20  9:44 AM  Result Value Ref Range   Cholesterol 144 <200 mg/dL   HDL 59 > OR = 50 mg/dL   Triglycerides 89 <150 mg/dL   LDL Cholesterol (Calc) 68 mg/dL (calc)    Comment: Reference range: <100 . Desirable range <100 mg/dL for primary prevention;   <70 mg/dL for patients with CHD or diabetic patients  with > or = 2 CHD risk factors. Marland Kitchen LDL-C is now calculated using the Martin-Hopkins  calculation, which is a validated novel method providing  better accuracy than the Friedewald equation in the  estimation of LDL-C.  Cresenciano Genre et al. Annamaria Helling. 0947;096(28): 2061-2068  (http://education.[facility].com/faq/FAQ164)    Total CHOL/HDL Ratio 2.4 <5.0 (calc)   Non-HDL Cholesterol (Calc) 85 <130 mg/dL (calc)    Comment: For patients with diabetes plus 1 major ASCVD risk  factor, treating to a non-HDL-C goal of <100 mg/dL  (LDL-C of <70 mg/dL) is considered a therapeutic  option.   COMPLETE METABOLIC PANEL WITH GFR     Status: Abnormal   Collection Time: 06/16/20  9:44 AM  Result Value Ref Range   Glucose, Bld 86 65 - 99 mg/dL    Comment: .            Fasting reference interval .    BUN 13 7 - 25 mg/dL   Creat 1.00 (H) 0.50 - 0.99 mg/dL    Comment: For patients >23 years of age, the reference limit for Creatinine is approximately 13% higher for people identified as  African-American. .    GFR, Est Non African American 60 > OR = 60 mL/min/1.12m2   GFR, Est African American 69 > OR = 60 mL/min/1.57m2   BUN/Creatinine Ratio 13 6 - 22 (calc)   Sodium 143 135 - 146 mmol/L   Potassium 3.8 3.5 - 5.3 mmol/L   Chloride 109 98 - 110 mmol/L   CO2 27 20 - 32 mmol/L   Calcium 8.8 8.6 - 10.4 mg/dL   Total Protein 5.8 (L) 6.1 - 8.1 g/dL   Albumin 3.6 3.6 - 5.1 g/dL   Globulin 2.2 1.9 - 3.7 g/dL (calc)   AG Ratio 1.6 1.0 - 2.5 (calc)   Total Bilirubin 0.5 0.2 - 1.2 mg/dL   Alkaline phosphatase (APISO) 63 37 - 153 U/L   AST 13 10 - 35 U/L   ALT 9 6 - 29 U/L  CBC with Differential/Platelet     Status: None   Collection Time: 06/16/20  9:44 AM  Result Value Ref Range   WBC 8.3 3.8 - 10.8 Thousand/uL   RBC 3.91 3.80 - 5.10 Million/uL   Hemoglobin 12.5 11.7 - 15.5 g/dL   HCT 38.6 35 - 45 %   MCV 98.7 80.0 - 100.0 fL   MCH 32.0 27.0 - 33.0 pg   MCHC 32.4 32.0 - 36.0 g/dL   RDW 12.1 11.0 - 15.0 %   Platelets 270 140 - 400 Thousand/uL   MPV 9.4 7.5 - 12.5 fL   Neutro Abs 4,399 1,500 - 7,800 cells/uL   Lymphs Abs 2,822 850 - 3,900 cells/uL   Absolute Monocytes 531 200 - 950 cells/uL   Eosinophils Absolute 465 15.0 - 500.0 cells/uL   Basophils Absolute 83 0.0 - 200.0 cells/uL  Neutrophils Relative % 53 %   Total Lymphocyte 34.0 %   Monocytes Relative 6.4 %   Eosinophils Relative 5.6 %   Basophils Relative 1.0 %  Hemoglobin A1c     Status: None   Collection Time: 06/16/20  9:44 AM  Result Value Ref Range   Hgb A1c MFr Bld 5.0 <5.7 % of total Hgb    Comment: For the purpose of screening for the presence of diabetes: . <5.7%       Consistent with the absence of diabetes 5.7-6.4%    Consistent with increased risk for diabetes             (prediabetes) > or =6.5%  Consistent with diabetes . This assay result is consistent with a decreased risk of diabetes. . Currently, no consensus exists regarding use of hemoglobin A1c for diagnosis of diabetes in  children. . According to American Diabetes Association (ADA) guidelines, hemoglobin A1c <7.0% represents optimal control in non-pregnant diabetic patients. Different metrics may apply to specific patient populations.  Standards of Medical Care in Diabetes(ADA). .    Mean Plasma Glucose 97 (calc)   eAG (mmol/L) 5.4 (calc)    -------------------------------------------------------------------------- A&P:  Problem List Items Addressed This Visit      Respiratory   Bronchitis - Primary    Bronchitis, without hx of asthma or COPD.  Will treat with prednisone 50mg  daily x 5 days, albuterol inhaler 1-2 puffs every 4-6 hours as needed for cough, SOB or wheezing.  To begin tessalon perles, 1 perle 2x per day as needed for cough and promethazine DM 38mL every 6 hours as needed for cough.  To not drive or operate heavy machinery while taking this medication, as it can cause sedation.  Strict ER precautions.  Plan: 1. Begin prednisone 50mg  daily x 5 days 2. Begin tessalon perles, 1 perle BID PRN for cough 3. Begin promethazine DM 71mL every 6 hours as needed for cough 4. Can use albuterol inhaler 1-2 puffs every 4-6 hours as needed for cough, SOB and/or wheezing 5. Strict ER precautions 6. RTC PRN        Relevant Medications   predniSONE (DELTASONE) 50 MG tablet   promethazine-dextromethorphan (PROMETHAZINE-DM) 6.25-15 MG/5ML syrup   benzonatate (TESSALON) 100 MG capsule   albuterol (VENTOLIN HFA) 108 (90 Base) MCG/ACT inhaler      Meds ordered this encounter  Medications  . predniSONE (DELTASONE) 50 MG tablet    Sig: Take 1 tablet daily x 5 days    Dispense:  5 tablet    Refill:  0  . promethazine-dextromethorphan (PROMETHAZINE-DM) 6.25-15 MG/5ML syrup    Sig: Take 5 mLs by mouth 4 (four) times daily as needed for cough.    Dispense:  118 mL    Refill:  0  . benzonatate (TESSALON) 100 MG capsule    Sig: Take 1 capsule (100 mg total) by mouth 2 (two) times daily as needed for  cough.    Dispense:  20 capsule    Refill:  0  . albuterol (VENTOLIN HFA) 108 (90 Base) MCG/ACT inhaler    Sig: Inhale 2 puffs into the lungs every 6 (six) hours as needed for wheezing or shortness of breath.    Dispense:  8 g    Refill:  0    Follow-up: - Return if symptoms worsen or fail to improve  Patient verbalizes understanding with the above medical recommendations including the limitation of remote medical advice.  Specific follow-up and call-back criteria were given for  patient to follow-up or seek medical care more urgently if needed.  - Time spent in direct consultation with patient on phone: 6 minutes  Harlin Rain, [facility] 08/19/2020, 4:04 PM

## 2020-08-27 NOTE — Telephone Encounter (Signed)
Pt complains of urinary urgency, sharp pain when urination, and urinary frequency x 8:00am.

## 2020-08-30 ENCOUNTER — Telehealth (INDEPENDENT_AMBULATORY_CARE_PROVIDER_SITE_OTHER): Payer: Medicare HMO | Admitting: Family Medicine

## 2020-08-30 ENCOUNTER — Encounter: Payer: Self-pay | Admitting: Family Medicine

## 2020-08-30 ENCOUNTER — Other Ambulatory Visit: Payer: Self-pay

## 2020-08-30 DIAGNOSIS — R319 Hematuria, unspecified: Secondary | ICD-10-CM | POA: Diagnosis not present

## 2020-08-30 DIAGNOSIS — N390 Urinary tract infection, site not specified: Secondary | ICD-10-CM | POA: Diagnosis not present

## 2020-08-30 LAB — POCT URINALYSIS DIPSTICK
Bilirubin, UA: POSITIVE
Glucose, UA: NEGATIVE
Ketones, UA: NEGATIVE
Nitrite, UA: POSITIVE
Protein, UA: POSITIVE — AB
Spec Grav, UA: 1.01 (ref 1.010–1.025)
Urobilinogen, UA: 4 E.U./dL — AB
pH, UA: 5 (ref 5.0–8.0)

## 2020-08-30 MED ORDER — CEPHALEXIN 500 MG PO CAPS: 500.0000 mg | ORAL_CAPSULE | Freq: Three times a day (TID) | ORAL | 0 refills | Status: DC

## 2020-08-30 NOTE — Patient Instructions (Addendum)
1. You have a Urinary Tract Infection - this is very common, your symptoms are reassuring and you should get better within 1 week on the antibiotics - Start Keflex 500mg  3 times daily for next 7 days, complete entire course, even if feeling better - We sent urine for a culture, we will call you within next few days if we need to change antibiotics - Please drink plenty of fluids, improve hydration over next 1 week  If symptoms worsening, developing nausea / vomiting, worsening back pain, fevers / chills / sweats, then please return for re-evaluation sooner.  If you take AZO OTC - limit this to 2-3 days MAX to avoid affecting kidneys   Please schedule a Follow-up Appointment to: Return in about 1 week (around 09/06/2020), or if symptoms worsen or fail to improve, for UTI.  If you have any other questions or concerns, please feel free to call the office or send a message through [place]. You may also schedule an earlier appointment if necessary.  Additionally, you may be receiving a survey about your experience at our office within a few days to 1 week by e-mail or mail. We value your feedback.  Nobie Putnam, DO [place]

## 2020-08-30 NOTE — Progress Notes (Addendum)
Virtual Visit via Telephone The purpose of this virtual visit is to provide medical care while limiting exposure to the novel coronavirus (COVID19) for both patient and office staff.  Consent was obtained for phone visit:  Yes.   Answered questions that patient had about telehealth interaction:  Yes.   I discussed the limitations, risks, security and privacy concerns of performing an evaluation and management service by telephone. I also discussed with the patient that there may be a patient responsible charge related to this service. The patient expressed understanding and agreed to proceed.  Patient Location: Home Provider Location: [facility] (Office)  Participants in virtual visit: - Patient: [name] - CMA: Frederich Cha, [place] - Provider: Dr Parks Ranger  ---------------------------------------------------------------------- Chief Complaint  Patient presents with  . Urinary Tract Infection    onset 5 days--OTC Azo taken--burning, frequency, abdominal and back pain    S: Reviewed CMA documentation. I have called patient and gathered additional HPI as follows:  UTI Reports that symptoms started last week patient mychart messaged [place] on 08/27/20 regarding this issue, and I contacted her at that time with a treatment plan and work in apt today.  Today she reports UTi symptoms burning frequency not improved. Same as before, worsening abdominal discomfort back pain.  Tried AZO without relief  Denies any known or suspected exposure to person with or possibly with COVID19.  Denies any fevers, chills, sweats, body ache, cough, shortness of breath, sinus pain or pressure, headache, abdominal pain, diarrhea  Past Medical History:  Diagnosis Date  . Anxiety   . Arthritis    left hip  . GERD (gastroesophageal reflux disease)   . Hepatitis 1972   hep B  . Memory disorder 09/21/2017  . OSA (obstructive sleep apnea) 05/08/2018  . PONV (postoperative nausea and  vomiting)   . Seizures ([place])    Social History   Tobacco Use  . Smoking status: Former Smoker    Packs/day: 0.25    Years: 5.00    Pack years: 1.25    Types: Cigarettes    Quit date: 07/25/2001    Years since quitting: 19.1  . Smokeless tobacco: Former Network engineer Use Topics  . Alcohol use: Yes    Alcohol/week: 0.0 standard drinks    Comment: 2 glasses of wine per week  . Drug use: No    Current Outpatient Medications:  .  acetaminophen (TYLENOL) 500 MG tablet, Take 2 tablets (1,000 mg total) by mouth every 6 (six) hours as needed for mild pain or moderate pain., Disp: 30 tablet, Rfl: 0 .  albuterol (VENTOLIN HFA) 108 (90 Base) MCG/ACT inhaler, Inhale 2 puffs into the lungs every 6 (six) hours as needed for wheezing or shortness of breath., Disp: 8 g, Rfl: 0 .  aspirin EC 81 MG tablet, Take 81 mg by mouth daily., Disp: , Rfl:  .  B Complex Vitamins (VITAMIN-B COMPLEX PO), Take by mouth daily., Disp: , Rfl:  .  benzonatate (TESSALON) 100 MG capsule, Take 1 capsule (100 mg total) by mouth 2 (two) times daily as needed for cough., Disp: 20 capsule, Rfl: 0 .  FLUoxetine (PROZAC) 10 MG capsule, Take 1 capsule (10 mg total) by mouth daily. Take with other dose of Fluoxetine 20mg  daily = total daily dose 30mg ., Disp: 90 capsule, Rfl: 1 .  FLUoxetine (PROZAC) 20 MG capsule, Take 1 capsule (20 mg total) by mouth daily. Take with other dose of Fluoxetine 10mg  daily = total daily dose 30mg .,  Disp: 90 capsule, Rfl: 1 .  levETIRAcetam (KEPPRA XR) 500 MG 24 hr tablet, Take 3 tablets (1,500 mg total) by mouth at bedtime., Disp: 270 tablet, Rfl: 1 .  Melatonin 3 MG TABS, Take 6 mg by mouth at bedtime. , Disp: , Rfl:  .  Multiple Vitamin (MULTIVITAMIN WITH MINERALS) TABS, Take 1 tablet by mouth daily. , Disp: , Rfl:  .  Omega-3 Fatty Acids (FISH OIL) 1200 MG CAPS, Take 1 capsule by mouth 2 (two) times daily., Disp: , Rfl:  .  predniSONE (DELTASONE) 50 MG tablet, Take 1 tablet daily x 5 days,  Disp: 5 tablet, Rfl: 0 .  promethazine-dextromethorphan (PROMETHAZINE-DM) 6.25-15 MG/5ML syrup, Take 5 mLs by mouth 4 (four) times daily as needed for cough., Disp: 118 mL, Rfl: 0 .  rosuvastatin (CRESTOR) 10 MG tablet, TAKE 1 TABLET (10 MG TOTAL) BY MOUTH AT BEDTIME., Disp: 90 tablet, Rfl: 3 .  VITAMIN D, CHOLECALCIFEROL, PO, Take by mouth daily., Disp: , Rfl:  .  cephALEXin (KEFLEX) 500 MG capsule, Take 1 capsule (500 mg total) by mouth 3 (three) times daily. For 7 days, Disp: 21 capsule, Rfl: 0  Depression screen [facility] 2/9 08/19/2020 06/21/2020 05/05/2020  Decreased Interest 0 1 1  Down, Depressed, Hopeless 0 1 2  PHQ - 2 Score 0 2 3  Altered sleeping 0 2 1  Tired, decreased energy 0 1 1  Change in appetite 0 0 1  Feeling bad or failure about yourself  0 0 1  Trouble concentrating 0 2 2  Moving slowly or fidgety/restless 0 0 1  Suicidal thoughts 0 1 1  PHQ-9 Score 0 8 11  Difficult doing work/chores - Somewhat difficult Somewhat difficult    GAD 7 : Generalized Anxiety Score 08/19/2020 06/21/2020 05/05/2020 05/22/2018  Nervous, Anxious, on Edge 0 1 1 1   Control/stop worrying 0 1 1 0  Worry too much - different things 0 0 0 0  Trouble relaxing 0 1 1 0  Restless 0 1 0 0  Easily annoyed or irritable 0 1 0 0  Afraid - awful might happen 0 2 2 1   Total GAD 7 Score 0 7 5 2   Anxiety Difficulty - Not difficult at all Somewhat difficult Not difficult at all    -------------------------------------------------------------------------- O: No physical exam performed due to remote telephone encounter.  Lab results reviewed.  Recent Results (from the past 2160 hour(s))  VITAMIN D 25 Hydroxy (Vit-D Deficiency, Fractures)     Status: None   Collection Time: 06/16/20  9:44 AM  Result Value Ref Range   Vit D, 25-Hydroxy 44 30 - 100 ng/mL    Comment: Vitamin D Status         25-OH Vitamin D: . Deficiency:                    <20 ng/mL Insufficiency:             20 - 29 ng/mL Optimal:                  > or = 30 ng/mL . For 25-OH Vitamin D testing on patients on  D2-supplementation and patients for whom quantitation  of D2 and D3 fractions is required, the [facility]ureD(TM) 25-OH VIT D, (D2,D3), LC/MS/MS is recommended: order  code [phone] (patients >29yrs). See Note 1 . Note 1 . For additional information, please refer to  http://education.[facility].com/faq/FAQ199  (This link is being provided for informational/ educational purposes only.)   TSH  Status: None   Collection Time: 06/16/20  9:44 AM  Result Value Ref Range   TSH 2.76 0.40 - 4.50 mIU/L  Lipid panel     Status: None   Collection Time: 06/16/20  9:44 AM  Result Value Ref Range   Cholesterol 144 <200 mg/dL   HDL 59 > OR = 50 mg/dL   Triglycerides 89 <150 mg/dL   LDL Cholesterol (Calc) 68 mg/dL (calc)    Comment: Reference range: <100 . Desirable range <100 mg/dL for primary prevention;   <70 mg/dL for patients with CHD or diabetic patients  with > or = 2 CHD risk factors. Marland Kitchen LDL-C is now calculated using the Martin-Hopkins  calculation, which is a validated novel method providing  better accuracy than the Friedewald equation in the  estimation of LDL-C.  Cresenciano Genre et al. Annamaria Helling. 1601;093(23): 2061-2068  (http://education.[facility].com/faq/FAQ164)    Total CHOL/HDL Ratio 2.4 <5.0 (calc)   Non-HDL Cholesterol (Calc) 85 <130 mg/dL (calc)    Comment: For patients with diabetes plus 1 major ASCVD risk  factor, treating to a non-HDL-C goal of <100 mg/dL  (LDL-C of <70 mg/dL) is considered a therapeutic  option.   COMPLETE METABOLIC PANEL WITH GFR     Status: Abnormal   Collection Time: 06/16/20  9:44 AM  Result Value Ref Range   Glucose, Bld 86 65 - 99 mg/dL    Comment: .            Fasting reference interval .    BUN 13 7 - 25 mg/dL   Creat 1.00 (H) 0.50 - 0.99 mg/dL    Comment: For patients >59 years of age, the reference limit for Creatinine is approximately 13% higher for  people identified as African-American. .    GFR, Est Non African American 60 > OR = 60 mL/min/1.18m2   GFR, Est African American 69 > OR = 60 mL/min/1.36m2   BUN/Creatinine Ratio 13 6 - 22 (calc)   Sodium 143 135 - 146 mmol/L   Potassium 3.8 3.5 - 5.3 mmol/L   Chloride 109 98 - 110 mmol/L   CO2 27 20 - 32 mmol/L   Calcium 8.8 8.6 - 10.4 mg/dL   Total Protein 5.8 (L) 6.1 - 8.1 g/dL   Albumin 3.6 3.6 - 5.1 g/dL   Globulin 2.2 1.9 - 3.7 g/dL (calc)   AG Ratio 1.6 1.0 - 2.5 (calc)   Total Bilirubin 0.5 0.2 - 1.2 mg/dL   Alkaline phosphatase (APISO) 63 37 - 153 U/L   AST 13 10 - 35 U/L   ALT 9 6 - 29 U/L  CBC with Differential/Platelet     Status: None   Collection Time: 06/16/20  9:44 AM  Result Value Ref Range   WBC 8.3 3.8 - 10.8 Thousand/uL   RBC 3.91 3.80 - 5.10 Million/uL   Hemoglobin 12.5 11.7 - 15.5 g/dL   HCT 38.6 35 - 45 %   MCV 98.7 80.0 - 100.0 fL   MCH 32.0 27.0 - 33.0 pg   MCHC 32.4 32.0 - 36.0 g/dL   RDW 12.1 11.0 - 15.0 %   Platelets 270 140 - 400 Thousand/uL   MPV 9.4 7.5 - 12.5 fL   Neutro Abs 4,399 1,500 - 7,800 cells/uL   Lymphs Abs 2,822 850 - 3,900 cells/uL   Absolute Monocytes 531 200 - 950 cells/uL   Eosinophils Absolute 465 15.0 - 500.0 cells/uL   Basophils Absolute 83 0.0 - 200.0 cells/uL   Neutrophils Relative % 53 %  Total Lymphocyte 34.0 %   Monocytes Relative 6.4 %   Eosinophils Relative 5.6 %   Basophils Relative 1.0 %  Hemoglobin A1c     Status: None   Collection Time: 06/16/20  9:44 AM  Result Value Ref Range   Hgb A1c MFr Bld 5.0 <5.7 % of total Hgb    Comment: For the purpose of screening for the presence of diabetes: . <5.7%       Consistent with the absence of diabetes 5.7-6.4%    Consistent with increased risk for diabetes             (prediabetes) > or =6.5%  Consistent with diabetes . This assay result is consistent with a decreased risk of diabetes. . Currently, no consensus exists regarding use of hemoglobin A1c for  diagnosis of diabetes in children. . According to American Diabetes Association (ADA) guidelines, hemoglobin A1c <7.0% represents optimal control in non-pregnant diabetic patients. Different metrics may apply to specific patient populations.  Standards of Medical Care in Diabetes(ADA). .    Mean Plasma Glucose 97 (calc)   eAG (mmol/L) 5.4 (calc)  POCT Urinalysis Dipstick     Status: Abnormal   Collection Time: 08/30/20 10:24 AM  Result Value Ref Range   Color, UA dark amber     Comment: patient had taken OTC Azo   Clarity, UA cloudy    Glucose, UA Negative Negative   Bilirubin, UA positive    Ketones, UA negative    Spec Grav, UA 1.010 1.010 - 1.025   Blood, UA large    pH, UA 5.0 5.0 - 8.0   Protein, UA Positive (A) Negative   Urobilinogen, UA 4.0 (A) 0.2 or 1.0 E.U./dL   Nitrite, UA positive    Leukocytes, UA Large (3+) (A) Negative   Appearance cloudy    Odor      -------------------------------------------------------------------------- A&P:  Problem List Items Addressed This Visit    None    Visit Diagnoses    Urinary tract infection with hematuria, site unspecified    -  Primary   Relevant Medications   cephALEXin (KEFLEX) 500 MG capsule   Other Relevant Orders   POCT Urinalysis Dipstick   Urine Culture     Clinically consistent with UTI No recurrent UTI No systemic sign of pyelo by history  Plan: 1. UA dipstick - large leuks 2. Ordered Urine culture 3. Keflex 500mg  TID x 7 days 4. Improve PO hydration 5. RTC if no improvement 1-2 weeks, red flags given to return sooner   Meds ordered this encounter  Medications  . cephALEXin (KEFLEX) 500 MG capsule    Sig: Take 1 capsule (500 mg total) by mouth 3 (three) times daily. For 7 days    Dispense:  21 capsule    Refill:  0    Follow-up: - Return as needed if not improved  Patient verbalizes understanding with the above medical recommendations including the limitation of remote medical  advice.  Specific follow-up and call-back criteria were given for patient to follow-up or seek medical care more urgently if needed.     - Time spent in direct consultation with patient on phone: 7 minutes   Nobie Putnam, [facility] 08/30/2020, 10:15 AM

## 2020-09-01 LAB — URINE CULTURE
MICRO NUMBER:: 11234952
SPECIMEN QUALITY:: ADEQUATE

## 2020-09-03 ENCOUNTER — Other Ambulatory Visit: Payer: Self-pay

## 2020-09-03 ENCOUNTER — Ambulatory Visit
Admission: EM | Admit: 2020-09-03 | Discharge: 2020-09-03 | Disposition: A | Discharge status: Home / Self Care | Payer: Medicare HMO | Attending: Emergency Medicine | Admitting: Emergency Medicine

## 2020-09-03 ENCOUNTER — Encounter: Payer: Self-pay | Admitting: Emergency Medicine

## 2020-09-03 DIAGNOSIS — R350 Frequency of micturition: Secondary | ICD-10-CM | POA: Diagnosis not present

## 2020-09-03 DIAGNOSIS — N3000 Acute cystitis without hematuria: Secondary | ICD-10-CM | POA: Insufficient documentation

## 2020-09-03 DIAGNOSIS — R300 Dysuria: Secondary | ICD-10-CM | POA: Diagnosis not present

## 2020-09-03 LAB — URINALYSIS, COMPLETE (UACMP) WITH MICROSCOPIC
Bilirubin Urine: NEGATIVE
Glucose, UA: NEGATIVE mg/dL
Hgb urine dipstick: NEGATIVE
Ketones, ur: NEGATIVE mg/dL
Leukocytes,Ua: NEGATIVE
Nitrite: POSITIVE — AB
Protein, ur: NEGATIVE mg/dL
Specific Gravity, Urine: 1.01 (ref 1.005–1.030)
pH: 6 (ref 5.0–8.0)

## 2020-09-03 MED ORDER — SULFAMETHOXAZOLE-TRIMETHOPRIM 800-160 MG PO TABS: 1.0000 | ORAL_TABLET | Freq: Two times a day (BID) | ORAL | 0 refills | Status: AC

## 2020-09-03 NOTE — ED Triage Notes (Signed)
Patient c/o burning when urinating and urinary frequency and bladder pressure that started Thursday of last week.  Patient states that she is currently on antibiotic for UTI and started it on Monday.

## 2020-09-03 NOTE — ED Provider Notes (Signed)
[facility]    CSN: [id number] Arrival date & time: 09/03/20  0909      History   Chief Complaint Chief Complaint  Patient presents with  . Urinary Urgency  . Dysuria    HPI [name] is a 64 y.o. female presenting for continued UTI symptoms following diagnosis and treatment of UTI starting 08/30/2020.  Patient states she has been taking Keflex.  She reports that she continues to have burning with urination, urinary frequency and bladder pressure.  Symptoms of been present for 6 days.  Denies any associated fever, chills, weakness, abdominal pain, back pain, blood in urine.  Denies any nausea or vomiting.  Patient states that her symptoms might be slightly better but she thinks that is only because she continues to take AZO.  Patient says that she normally takes Bactrim DS when she gets UTIs.  She has no other complaints or concerns today.  HPI  Past Medical History:  Diagnosis Date  . Anxiety   . Arthritis    left hip  . GERD (gastroesophageal reflux disease)   . Hepatitis 1972   hep B  . Memory disorder 09/21/2017  . OSA (obstructive sleep apnea) 05/08/2018  . PONV (postoperative nausea and vomiting)   . Seizures [facility])     Patient Active Problem List   Diagnosis Date Noted  . Bronchitis 08/19/2020  . Insomnia 05/05/2020  . CKD (chronic kidney disease), stage II 06/20/2019  . History of hemorrhagic cerebrovascular accident (CVA) with residual deficit 05/23/2018  . OSA (obstructive sleep apnea) 05/08/2018  . Aphasia due to and not concurrent with nontraumatic subarachnoid hemorrhage 10/25/2017  . Memory disorder 09/21/2017  . Seizures ([place]) 04/09/2017  . Aneurysm, carotid artery, internal 01/08/2017  . Screening for breast cancer 05/21/2015  . Special screening for malignant neoplasms, colon 01/13/2013  . Hyperlipidemia 12/19/2012  . Obesity (BMI 35.0-39.9 without comorbidity) 09/23/2012  . Recurrent major depression in partial remission  ([place]) 07/26/2011    Past Surgical History:  Procedure Laterality Date  . ABDOMINAL HYSTERECTOMY  04/1999  . ARTHROSCOPIC REPAIR ACL     bilateral knees  . BREAST EXCISIONAL BIOPSY Right 2008   NEG  . BREAST EXCISIONAL BIOPSY Left YRS AGO   X2 - NEG  . BREAST LUMPECTOMY  06/1979; 03/1995   left  . CHOLECYSTECTOMY  09/1987  . LAPAROSCOPIC GASTRIC SLEEVE RESECTION  09/30/2012   Procedure: LAPAROSCOPIC GASTRIC SLEEVE RESECTION;  Surgeon: Madilyn Hook, DO;  Location: [facility];  Service: General;  Laterality: N/A;  . TOTAL HIP ARTHROPLASTY  11/13/2012   left  . UPPER GI ENDOSCOPY  09/30/2012   Procedure: UPPER GI ENDOSCOPY;  Surgeon: Madilyn Hook, DO;  Location: [facility];  Service: General;;    OB History   No obstetric history on file.      Home Medications    Prior to Admission medications   Medication Sig Start Date End Date Taking? Authorizing Provider  aspirin EC 81 MG tablet Take 81 mg by mouth daily.   Yes [provider]  B Complex Vitamins (VITAMIN-B COMPLEX PO) Take by mouth daily.   Yes [provider]  cephALEXin (KEFLEX) 500 MG capsule Take 1 capsule (500 mg total) by mouth 3 (three) times daily. For 7 days 08/30/20  Yes Karamalegos, Devonne Doughty, DO  FLUoxetine (PROZAC) 10 MG capsule Take 1 capsule (10 mg total) by mouth daily. Take with other dose of Fluoxetine 20mg  daily = total daily dose 30mg . 03/11/20  Yes Nobie Putnam  J, DO  FLUoxetine (PROZAC) 20 MG capsule Take 1 capsule (20 mg total) by mouth daily. Take with other dose of Fluoxetine 10mg  daily = total daily dose 30mg . 03/11/20  Yes Karamalegos, Devonne Doughty, DO  levETIRAcetam (KEPPRA XR) 500 MG 24 hr tablet Take 3 tablets (1,500 mg total) by mouth at bedtime. 05/10/20  Yes Kathrynn Ducking, MD  Melatonin 3 MG TABS Take 6 mg by mouth at bedtime.    Yes [provider]  Multiple Vitamin (MULTIVITAMIN WITH MINERALS) TABS Take 1 tablet by mouth daily.    Yes [provider]  Omega-3  Fatty Acids (FISH OIL) 1200 MG CAPS Take 1 capsule by mouth 2 (two) times daily.   Yes [provider]  rosuvastatin (CRESTOR) 10 MG tablet TAKE 1 TABLET (10 MG TOTAL) BY MOUTH AT BEDTIME. 06/24/20  Yes Karamalegos, Devonne Doughty, DO  VITAMIN D, CHOLECALCIFEROL, PO Take by mouth daily.   Yes [provider]  acetaminophen (TYLENOL) 500 MG tablet Take 2 tablets (1,000 mg total) by mouth every 6 (six) hours as needed for mild pain or moderate pain. 04/21/15   Betancourt, Aura Fey, NP  albuterol (VENTOLIN HFA) 108 (90 Base) MCG/ACT inhaler Inhale 2 puffs into the lungs every 6 (six) hours as needed for wheezing or shortness of breath. 08/19/20   Malfi, Lupita Raider, FNP  benzonatate (TESSALON) 100 MG capsule Take 1 capsule (100 mg total) by mouth 2 (two) times daily as needed for cough. 08/19/20   Malfi, Lupita Raider, FNP  predniSONE (DELTASONE) 50 MG tablet Take 1 tablet daily x 5 days 08/19/20   Verl Bangs, FNP  promethazine-dextromethorphan (PROMETHAZINE-DM) 6.25-15 MG/5ML syrup Take 5 mLs by mouth 4 (four) times daily as needed for cough. 08/19/20   Malfi, Lupita Raider, FNP    Family History Family History  Problem Relation Age of Onset  . Hyperlipidemia Mother   . Obesity Mother   . Pulmonary fibrosis Mother   . Cancer Maternal Aunt        ovarian cancer  . Cancer Father   . Colon cancer Paternal Grandmother   . Esophageal cancer Neg Hx   . Stomach cancer Neg Hx   . Rectal cancer Neg Hx   . Breast cancer Neg Hx     Social History Social History   Tobacco Use  . Smoking status: Former Smoker    Packs/day: 0.25    Years: 5.00    Pack years: 1.25    Types: Cigarettes    Quit date: 07/25/2001    Years since quitting: 19.1  . Smokeless tobacco: Former Network engineer  . Vaping Use: Never used  Substance Use Topics  . Alcohol use: Yes    Alcohol/week: 0.0 standard drinks    Comment: 2 glasses of wine per week  . Drug use: No     Allergies   Patient has no known  allergies.   Review of Systems Review of Systems  Constitutional: Negative for chills and fever.  Gastrointestinal: Negative for abdominal pain, diarrhea, nausea and vomiting.       "bladder pressure"  Genitourinary: Positive for dysuria, frequency and urgency. Negative for decreased urine volume, flank pain, hematuria, pelvic pain, vaginal bleeding, vaginal discharge and vaginal pain.  Musculoskeletal: Negative for back pain.  Skin: Negative for rash.     Physical Exam Triage Vital Signs ED Triage Vitals  Enc Vitals Group     BP 09/03/20 0924 95/69     Pulse Rate 09/03/20  0924 79     Resp 09/03/20 0924 14     Temp 09/03/20 0924 98.7 F (37.1 C)     Temp Source 09/03/20 0924 Oral     SpO2 09/03/20 0924 97 %     Weight 09/03/20 0922 200 lb (90.7 kg)     Height 09/03/20 0922 5\' 2"  (1.575 m)     Head Circumference --      Peak Flow --      Pain Score 09/03/20 0921 6     Pain Loc --      Pain Edu? --      Excl. in [place]? --    No data found.  Updated Vital Signs BP 95/69 (BP Location: Right Arm)   Pulse 79   Temp 98.7 F (37.1 C) (Oral)   Resp 14   Ht 5\' 2"  (1.575 m)   Wt 200 lb (90.7 kg)   SpO2 97%   BMI 36.58 kg/m       Physical Exam Vitals and nursing note reviewed.  Constitutional:      General: She is not in acute distress.    Appearance: Normal appearance. She is not ill-appearing or toxic-appearing.  HENT:     Head: Normocephalic and atraumatic.  Eyes:     General: No scleral icterus.       Right eye: No discharge.        Left eye: No discharge.     Conjunctiva/sclera: Conjunctivae normal.  Cardiovascular:     Rate and Rhythm: Normal rate and regular rhythm.     Heart sounds: Normal heart sounds.  Pulmonary:     Effort: Pulmonary effort is normal. No respiratory distress.     Breath sounds: Normal breath sounds.  Abdominal:     Palpations: Abdomen is soft.     Tenderness: There is no abdominal tenderness. There is no right CVA tenderness or left  CVA tenderness.  Musculoskeletal:     Cervical back: Neck supple.  Skin:    General: Skin is dry.  Neurological:     General: No focal deficit present.     Mental Status: She is alert. Mental status is at baseline.     Motor: No weakness.     Gait: Gait normal.  Psychiatric:        Mood and Affect: Mood normal.        Behavior: Behavior normal.        Thought Content: Thought content normal.      UC Treatments / Results  Labs (all labs ordered are listed, but only abnormal results are displayed) Labs Reviewed  URINALYSIS, COMPLETE (UACMP) WITH MICROSCOPIC - Abnormal; Notable for the following components:      Result Value   APPearance HAZY (*)    Nitrite POSITIVE (*)    Bacteria, UA FEW (*)    All other components within normal limits    EKG   Radiology No results found.  Procedures Procedures (including critical care time)  Medications Ordered in UC Medications - No data to display  Initial Impression / Assessment and Plan / UC Course  I have reviewed the triage vital signs and the nursing notes.  Pertinent labs & imaging results that were available during my care of the patient were reviewed by me and considered in my medical decision making (see chart for details).   65 year old female presenting for continued UTI symptoms following initiation of Keflex 4 days ago.  Reviewed her urine culture that was obtained at that  time.  Urine culture resistant to ampicillin and intermediate to ampicillin/sulbactam.  Culture shows sensitivity to over the medication including nitrofurantoin, Bactrim, Levaquin, ceftriaxone, Augmentin, and ciprofloxacin.  Urinalysis obtained today shows nitrites that are positive and hazy urine.  Treating patient at this time with Bactrim DS.  Advise increase fluids and rest.  Supportive care and follow-up with our department as needed.   Final Clinical Impressions(s) / UC Diagnoses   Final diagnoses:  Acute cystitis without hematuria    Dysuria  Urinary frequency     Discharge Instructions     UTI: Stop Keflex and start Bactrim DS. Your symptoms should be much improved over the next 2-3 days. Increase rest and fluid intake. If for some reason symptoms are worsening or not improving, the antibiotics may need to be changed. Return or go to ER for fever, back pain, worsening urinary pain, discharge, increased blood in urine. May take Tylenol or Motrin OTC for pain relief if no contraindications     ED Prescriptions    None     PDMP not reviewed this encounter.   Danton Clap, PA-C 09/03/20 1013

## 2020-09-03 NOTE — Discharge Instructions (Addendum)
UTI: Stop Keflex and start Bactrim DS. Your symptoms should be much improved over the next 2-3 days. Increase rest and fluid intake. If for some reason symptoms are worsening or not improving, the antibiotics may need to be changed. Return or go to ER for fever, back pain, worsening urinary pain, discharge, increased blood in urine. May take Tylenol or Motrin OTC for pain relief if no contraindications

## 2020-10-04 ENCOUNTER — Other Ambulatory Visit: Payer: Self-pay | Admitting: Neurology

## 2020-10-07 ENCOUNTER — Encounter: Payer: Self-pay | Admitting: Neurology

## 2020-10-07 ENCOUNTER — Ambulatory Visit: Payer: Medicare HMO | Admitting: Neurology

## 2020-10-07 VITALS — BP 126/83 | HR 68 | Ht 62.0 in | Wt 212.0 lb

## 2020-10-07 DIAGNOSIS — R569 Unspecified convulsions: Secondary | ICD-10-CM | POA: Diagnosis not present

## 2020-10-07 MED ORDER — LEVETIRACETAM 750 MG PO TABS: 750.0000 mg | ORAL_TABLET | Freq: Two times a day (BID) | ORAL | 4 refills | Status: DC

## 2020-10-07 NOTE — Progress Notes (Signed)
PATIENT: Elianni Tassi DOB: 1956/02/15  REASON FOR VISIT: follow up HISTORY FROM: patient  HISTORY OF PRESENT ILLNESS: Today 10/07/20  Ms. Spargur is a 64 year old female with history of subarachnoid hemorrhage and subsequent seizures.  No recurrent seizures.  At last visit, we switched to Keppra extended release, thinking IR was causing her to feel tired. She noted no change, wants to go back to IR.  Has been diagnosed with sleep apnea, but cannot tolerate CPAP.  She remains active, drives a car.  Overall health has been good.  Presents today for evaluation accompanied by her husband.  HISTORY 10/07/2019 SS: Ms. Ditsworth is a 64 year old female with history of subarachnoid hemorrhage and subsequent seizures.  She remains on Keppra 750 mg twice a day.  She indicates she has tolerated medication well, and she does notice that it makes her sleep well.  She has not had recurrent seizure.  She says she may have an occasional mild headache, last week she had a moderate headache, that was relieved with Advil, no other symptoms.  She does report some mild word finding difficulties as result of the subarachnoid hemorrhage.  She has been diagnosed with sleep apnea, but was unable to tolerate CPAP.  She remains very active, drives a car without difficulty.  She has not had any changes to her balance, or had any falls.  She is taking Prozac.  She says her overall health has been well, she is now taking Crestor.  She lives with her husband, and her father.  She presents today for evaluation unaccompanied.    REVIEW OF SYSTEMS: Out of a complete 14 system review of symptoms, the patient complains only of the following symptoms, and all other reviewed systems are negative.  n/a  ALLERGIES: No Known Allergies  HOME MEDICATIONS: Outpatient Medications Prior to Visit  Medication Sig Dispense Refill  . acetaminophen (TYLENOL) 500 MG tablet Take 2 tablets (1,000 mg total) by mouth every 6 (six) hours  as needed for mild pain or moderate pain. 30 tablet 0  . aspirin EC 81 MG tablet Take 81 mg by mouth daily.    . B Complex Vitamins (VITAMIN-B COMPLEX PO) Take by mouth daily.    Marland Kitchen FLUoxetine (PROZAC) 10 MG capsule Take 1 capsule (10 mg total) by mouth daily. Take with other dose of Fluoxetine 20mg  daily = total daily dose 30mg . 90 capsule 1  . FLUoxetine (PROZAC) 20 MG capsule Take 1 capsule (20 mg total) by mouth daily. Take with other dose of Fluoxetine 10mg  daily = total daily dose 30mg . 90 capsule 1  . Melatonin 3 MG TABS Take 6 mg by mouth at bedtime.     . Multiple Vitamin (MULTIVITAMIN WITH MINERALS) TABS Take 1 tablet by mouth daily.     . Omega-3 Fatty Acids (FISH OIL) 1200 MG CAPS Take 1 capsule by mouth 2 (two) times daily.    . rosuvastatin (CRESTOR) 10 MG tablet TAKE 1 TABLET (10 MG TOTAL) BY MOUTH AT BEDTIME. 90 tablet 3  . levETIRAcetam (KEPPRA XR) 500 MG 24 hr tablet Take 3 tablets (1,500 mg total) by mouth at bedtime. 270 tablet 1  . albuterol (VENTOLIN HFA) 108 (90 Base) MCG/ACT inhaler Inhale 2 puffs into the lungs every 6 (six) hours as needed for wheezing or shortness of breath. 8 g 0  . benzonatate (TESSALON) 100 MG capsule Take 1 capsule (100 mg total) by mouth 2 (two) times daily as needed for cough. 20 capsule 0  .  cephALEXin (KEFLEX) 500 MG capsule Take 1 capsule (500 mg total) by mouth 3 (three) times daily. For 7 days 21 capsule 0  . predniSONE (DELTASONE) 50 MG tablet Take 1 tablet daily x 5 days 5 tablet 0  . promethazine-dextromethorphan (PROMETHAZINE-DM) 6.25-15 MG/5ML syrup Take 5 mLs by mouth 4 (four) times daily as needed for cough. 118 mL 0  . VITAMIN D, CHOLECALCIFEROL, PO Take by mouth daily.     No facility-administered medications prior to visit.    PAST MEDICAL HISTORY: Past Medical History:  Diagnosis Date  . Anxiety   . Arthritis    left hip  . GERD (gastroesophageal reflux disease)   . Hepatitis 1972   hep B  . Memory disorder 09/21/2017  .  OSA (obstructive sleep apnea) 05/08/2018  . PONV (postoperative nausea and vomiting)   . Seizures ([place])     PAST SURGICAL HISTORY: Past Surgical History:  Procedure Laterality Date  . ABDOMINAL HYSTERECTOMY  04/1999  . ARTHROSCOPIC REPAIR ACL     bilateral knees  . BREAST EXCISIONAL BIOPSY Right 2008   NEG  . BREAST EXCISIONAL BIOPSY Left YRS AGO   X2 - NEG  . BREAST LUMPECTOMY  06/1979; 03/1995   left  . CHOLECYSTECTOMY  09/1987  . LAPAROSCOPIC GASTRIC SLEEVE RESECTION  09/30/2012   Procedure: LAPAROSCOPIC GASTRIC SLEEVE RESECTION;  Surgeon: Madilyn Hook, DO;  Location: [facility];  Service: General;  Laterality: N/A;  . TOTAL HIP ARTHROPLASTY  11/13/2012   left  . UPPER GI ENDOSCOPY  09/30/2012   Procedure: UPPER GI ENDOSCOPY;  Surgeon: Madilyn Hook, DO;  Location: [facility];  Service: General;;    FAMILY HISTORY: Family History  Problem Relation Age of Onset  . Hyperlipidemia Mother   . Obesity Mother   . Pulmonary fibrosis Mother   . Cancer Maternal Aunt        ovarian cancer  . Cancer Father   . Colon cancer Paternal Grandmother   . Esophageal cancer Neg Hx   . Stomach cancer Neg Hx   . Rectal cancer Neg Hx   . Breast cancer Neg Hx     SOCIAL HISTORY: Social History   Socioeconomic History  . Marital status: Married    Spouse name: Not on file  . Number of children: 0  . Years of education: BS  . Highest education level: Not on file  Occupational History  . Not on file  Tobacco Use  . Smoking status: Former Smoker    Packs/day: 0.25    Years: 5.00    Pack years: 1.25    Types: Cigarettes    Quit date: 07/25/2001    Years since quitting: 19.2  . Smokeless tobacco: Former Network engineer  . Vaping Use: Never used  Substance and Sexual Activity  . Alcohol use: Yes    Alcohol/week: 0.0 standard drinks    Comment: 2 glasses of wine per week  . Drug use: No  . Sexual activity: Not on file  Other Topics Concern  . Not on file  Social History Narrative    Lives with spouse   Caffeine use: Drinks coffee daily   Right handed   Social Determinants of Health   Financial Resource Strain: Not on file  Food Insecurity: Not on file  Transportation Needs: Not on file  Physical Activity: Not on file  Stress: Not on file  Social Connections: Not on file  Intimate Partner Violence: Not on file   PHYSICAL EXAM  Vitals:  10/07/20 1026  BP: 126/83  Pulse: 68  Weight: 212 lb (96.2 kg)  Height: 5\' 2"  (1.575 m)   Body mass index is 38.78 kg/m.  Generalized: Well developed, in no acute distress   Neurological examination  Mentation: Alert oriented to time, place, history taking. Follows all commands speech and language fluent Cranial nerve II-XII: Pupils were equal round reactive to light. Extraocular movements were full, visual field were full on confrontational test. Facial sensation and strength were normal. Head turning and shoulder shrug were normal and symmetric. Motor: The motor testing reveals 5 over 5 strength of all 4 extremities. Good symmetric motor tone is noted throughout.  Sensory: Sensory testing is intact to soft touch on all 4 extremities. No evidence of extinction is noted.  Coordination: Cerebellar testing reveals good finger-nose-finger and heel-to-shin bilaterally.  Gait and station: Gait is normal.  Reflexes: Deep tendon reflexes are symmetric and normal bilaterally.   DIAGNOSTIC DATA (LABS, IMAGING, TESTING) - I reviewed patient records, labs, notes, testing and imaging myself where available.  Lab Results  Component Value Date   WBC 8.3 06/16/2020   HGB 12.5 06/16/2020   HCT 38.6 06/16/2020   MCV 98.7 06/16/2020   PLT 270 06/16/2020      Component Value Date/Time   NA 143 06/16/2020 0944   NA 140 11/18/2012 0530   K 3.8 06/16/2020 0944   K 4.0 11/18/2012 0530   CL 109 06/16/2020 0944   CL 108 (H) 11/18/2012 0530   CO2 27 06/16/2020 0944   CO2 27 11/18/2012 0530   GLUCOSE 86 06/16/2020 0944   GLUCOSE 95  11/18/2012 0530   BUN 13 06/16/2020 0944   BUN 8 11/18/2012 0530   CREATININE 1.00 (H) 06/16/2020 0944   CALCIUM 8.8 06/16/2020 0944   CALCIUM 7.9 (L) 11/18/2012 0530   PROT 5.8 (L) 06/16/2020 0944   ALBUMIN 3.7 01/08/2017 1147   AST 13 06/16/2020 0944   ALT 9 06/16/2020 0944   ALKPHOS 88 01/08/2017 1147   BILITOT 0.5 06/16/2020 0944   GFRNONAA 60 06/16/2020 0944   GFRAA 69 06/16/2020 0944   Lab Results  Component Value Date   CHOL 144 06/16/2020   HDL 59 06/16/2020   LDLCALC 68 06/16/2020   LDLDIRECT 146.6 12/19/2012   TRIG 89 06/16/2020   CHOLHDL 2.4 06/16/2020   Lab Results  Component Value Date   HGBA1C 5.0 06/16/2020   Lab Results  Component Value Date   VITAMINB12 >1500 (H) 03/31/2014   Lab Results  Component Value Date   TSH 2.76 06/16/2020   ASSESSMENT AND PLAN 64 y.o. year old female  has a past medical history of Anxiety, Arthritis, GERD (gastroesophageal reflux disease), Hepatitis (1972), Memory disorder (09/21/2017), OSA (obstructive sleep apnea) (05/08/2018), PONV (postoperative nausea and vomiting), and Seizures (HCC). here with:  1.  Seizures  She continues to do well.  No recurrent seizures. We will switch back to Keppra IR 750 mg twice daily.  She noted no change in sleepiness with extended release preparation.  She likely needs to use CPAP.  She will think about this, let me know if she wants to give CPAP another try.  She will follow-up in 1 year or sooner if needed.  I spent 20 minutes of face-to-face and non-face-to-face time with patient.  This included previsit chart review, lab review, study review, order entry, electronic health record documentation, patient education.  05/10/2018, AGNP-C, DNP 10/07/2020, 11:11 AM [facility] [address], [address] [place],  [place] [address] [phone]

## 2020-10-07 NOTE — Patient Instructions (Signed)
We'll switch back to Keppra 750 mg twice daily Call for seizures  See you back in 1 year

## 2020-10-08 NOTE — Progress Notes (Signed)
I have read the note, and I agree with the clinical assessment and plan.  Prashant Glosser K Gregorey Nabor   

## 2020-10-09 ENCOUNTER — Other Ambulatory Visit: Payer: Self-pay | Admitting: Family Medicine

## 2020-10-09 DIAGNOSIS — F3341 Major depressive disorder, recurrent, in partial remission: Secondary | ICD-10-CM

## 2020-10-26 ENCOUNTER — Other Ambulatory Visit: Payer: Self-pay | Admitting: Family Medicine

## 2020-10-26 DIAGNOSIS — F3341 Major depressive disorder, recurrent, in partial remission: Secondary | ICD-10-CM

## 2020-11-17 DIAGNOSIS — F331 Major depressive disorder, recurrent, moderate: Secondary | ICD-10-CM | POA: Diagnosis not present

## 2020-11-17 DIAGNOSIS — F419 Anxiety disorder, unspecified: Secondary | ICD-10-CM | POA: Diagnosis not present

## 2020-11-22 ENCOUNTER — Encounter: Payer: Self-pay | Admitting: Internal Medicine

## 2020-11-29 DIAGNOSIS — D2262 Melanocytic nevi of left upper limb, including shoulder: Secondary | ICD-10-CM | POA: Diagnosis not present

## 2020-11-29 DIAGNOSIS — D485 Neoplasm of uncertain behavior of skin: Secondary | ICD-10-CM | POA: Diagnosis not present

## 2020-11-29 DIAGNOSIS — D2271 Melanocytic nevi of right lower limb, including hip: Secondary | ICD-10-CM | POA: Diagnosis not present

## 2020-11-29 DIAGNOSIS — L821 Other seborrheic keratosis: Secondary | ICD-10-CM | POA: Diagnosis not present

## 2020-11-29 DIAGNOSIS — D225 Melanocytic nevi of trunk: Secondary | ICD-10-CM | POA: Diagnosis not present

## 2020-11-29 DIAGNOSIS — D2272 Melanocytic nevi of left lower limb, including hip: Secondary | ICD-10-CM | POA: Diagnosis not present

## 2020-11-29 DIAGNOSIS — D2261 Melanocytic nevi of right upper limb, including shoulder: Secondary | ICD-10-CM | POA: Diagnosis not present

## 2020-11-29 DIAGNOSIS — B078 Other viral warts: Secondary | ICD-10-CM | POA: Diagnosis not present

## 2020-11-30 DIAGNOSIS — F419 Anxiety disorder, unspecified: Secondary | ICD-10-CM | POA: Diagnosis not present

## 2020-11-30 DIAGNOSIS — F331 Major depressive disorder, recurrent, moderate: Secondary | ICD-10-CM | POA: Diagnosis not present

## 2020-12-14 DIAGNOSIS — F419 Anxiety disorder, unspecified: Secondary | ICD-10-CM | POA: Diagnosis not present

## 2020-12-14 DIAGNOSIS — F331 Major depressive disorder, recurrent, moderate: Secondary | ICD-10-CM | POA: Diagnosis not present

## 2020-12-28 DIAGNOSIS — F331 Major depressive disorder, recurrent, moderate: Secondary | ICD-10-CM | POA: Diagnosis not present

## 2020-12-28 DIAGNOSIS — F419 Anxiety disorder, unspecified: Secondary | ICD-10-CM | POA: Diagnosis not present

## 2020-12-29 ENCOUNTER — Encounter: Payer: Self-pay | Admitting: Internal Medicine

## 2020-12-29 ENCOUNTER — Other Ambulatory Visit: Payer: Self-pay | Admitting: Family Medicine

## 2020-12-29 DIAGNOSIS — Z1231 Encounter for screening mammogram for malignant neoplasm of breast: Secondary | ICD-10-CM

## 2020-12-30 ENCOUNTER — Telehealth: Payer: Self-pay | Admitting: *Deleted

## 2020-12-30 NOTE — Telephone Encounter (Signed)
Completed to SS/NP for review, completion, and signature.

## 2020-12-30 NOTE — Telephone Encounter (Signed)
Pt [place] form on [facility] desk

## 2021-01-04 ENCOUNTER — Telehealth: Payer: Self-pay | Admitting: *Deleted

## 2021-01-04 DIAGNOSIS — Z0289 Encounter for other administrative examinations: Secondary | ICD-10-CM

## 2021-01-04 NOTE — Telephone Encounter (Signed)
Pt [place] form faxed on 01/04/21

## 2021-01-04 NOTE — Telephone Encounter (Signed)
Form signed and to MR. (medical records).

## 2021-01-12 DIAGNOSIS — F419 Anxiety disorder, unspecified: Secondary | ICD-10-CM | POA: Diagnosis not present

## 2021-01-12 DIAGNOSIS — F331 Major depressive disorder, recurrent, moderate: Secondary | ICD-10-CM | POA: Diagnosis not present

## 2021-02-03 ENCOUNTER — Other Ambulatory Visit: Payer: Self-pay | Admitting: Family Medicine

## 2021-02-03 ENCOUNTER — Other Ambulatory Visit: Payer: Self-pay

## 2021-02-03 ENCOUNTER — Ambulatory Visit
Admission: RE | Admit: 2021-02-03 | Discharge: 2021-02-03 | Disposition: A | Discharge status: Home / Self Care | Payer: Medicare HMO | Source: Ambulatory Visit | Attending: Family Medicine | Admitting: Family Medicine

## 2021-02-03 DIAGNOSIS — R928 Other abnormal and inconclusive findings on diagnostic imaging of breast: Secondary | ICD-10-CM

## 2021-02-03 DIAGNOSIS — Z1231 Encounter for screening mammogram for malignant neoplasm of breast: Secondary | ICD-10-CM | POA: Diagnosis not present

## 2021-02-03 DIAGNOSIS — R921 Mammographic calcification found on diagnostic imaging of breast: Secondary | ICD-10-CM

## 2021-02-07 DIAGNOSIS — F419 Anxiety disorder, unspecified: Secondary | ICD-10-CM | POA: Diagnosis not present

## 2021-02-07 DIAGNOSIS — F331 Major depressive disorder, recurrent, moderate: Secondary | ICD-10-CM | POA: Diagnosis not present

## 2021-02-09 ENCOUNTER — Ambulatory Visit
Admission: RE | Admit: 2021-02-09 | Discharge: 2021-02-09 | Disposition: A | Discharge status: Home / Self Care | Payer: Medicare HMO | Source: Ambulatory Visit | Attending: Family Medicine | Admitting: Family Medicine

## 2021-02-09 ENCOUNTER — Other Ambulatory Visit: Payer: Self-pay

## 2021-02-09 DIAGNOSIS — R928 Other abnormal and inconclusive findings on diagnostic imaging of breast: Secondary | ICD-10-CM

## 2021-02-09 DIAGNOSIS — R921 Mammographic calcification found on diagnostic imaging of breast: Secondary | ICD-10-CM | POA: Diagnosis not present

## 2021-02-09 DIAGNOSIS — R922 Inconclusive mammogram: Secondary | ICD-10-CM | POA: Diagnosis not present

## 2021-02-23 DIAGNOSIS — F331 Major depressive disorder, recurrent, moderate: Secondary | ICD-10-CM | POA: Diagnosis not present

## 2021-02-23 DIAGNOSIS — F419 Anxiety disorder, unspecified: Secondary | ICD-10-CM | POA: Diagnosis not present

## 2021-03-02 ENCOUNTER — Ambulatory Visit (AMBULATORY_SURGERY_CENTER): Payer: Self-pay | Admitting: *Deleted

## 2021-03-02 ENCOUNTER — Other Ambulatory Visit: Payer: Self-pay

## 2021-03-02 VITALS — Ht 62.0 in | Wt 208.0 lb

## 2021-03-02 DIAGNOSIS — Z860100 Personal history of colon polyps, unspecified: Secondary | ICD-10-CM

## 2021-03-02 MED ORDER — SUPREP BOWEL PREP KIT 17.5-3.13-1.6 GM/177ML PO SOLN: 1.0000 | Freq: Once | ORAL | 0 refills | Status: AC

## 2021-03-02 NOTE — Progress Notes (Signed)

## 2021-03-08 ENCOUNTER — Encounter: Payer: Self-pay | Admitting: Family Medicine

## 2021-03-08 ENCOUNTER — Other Ambulatory Visit: Payer: Self-pay

## 2021-03-08 ENCOUNTER — Ambulatory Visit (INDEPENDENT_AMBULATORY_CARE_PROVIDER_SITE_OTHER): Payer: Medicare HMO | Admitting: Family Medicine

## 2021-03-08 VITALS — Ht 62.0 in | Wt 207.8 lb

## 2021-03-08 DIAGNOSIS — R569 Unspecified convulsions: Secondary | ICD-10-CM

## 2021-03-08 DIAGNOSIS — F331 Major depressive disorder, recurrent, moderate: Secondary | ICD-10-CM

## 2021-03-08 NOTE — Progress Notes (Signed)
Subjective:    Patient ID: [name], female    DOB: Jun 14, 1956, 65 y.o.   MRN: [id number]  [name] is a 65 y.o. female presenting on 03/08/2021 for Depression and Anxiety   HPI   History ofSAH /CVAwith residual deficit / Memory Loss / OSA  Major Depression, recurrent Seizure Disorder  [facility] -Background on onsetproblem 12/01/16, had acute episode, taken to [facility], dx SAHthen taken to [facility]. Had CVA history of aneurysm.Had complication with seizure. - Overall has normal motor symptoms, coordination and strength and ambulation intact - Continues on Keppra 750 BID, no further seizures  She had a 2nd stroke CVA in 04/2017 She was followed by [facility] Dr Jannifer Franklin Her Prozac was increased to 30mg  twice a day, and then placed on Keppra 750mg  BID in July 2018.  Followed by [facility] yearly.  She felt she was spiraling down and sleepy and fatigued.  She went through [facility] and spoke with a telemedicine Psychiatry, they attempted to switch   Now past 1.5 weeks, she has been taking Fluoxetine 20mg  x 2 = 40mg   She feels uncontrolled worse flare of mood and anxiety.   Depression screen [facility] 2/9 03/08/2021 08/19/2020 06/21/2020  Decreased Interest 2 0 1  Down, Depressed, Hopeless 2 0 1  PHQ - 2 Score 4 0 2  Altered sleeping 1 0 2  Tired, decreased energy 1 0 1  Change in appetite 1 0 0  Feeling bad or failure about yourself  1 0 0  Trouble concentrating 2 0 2  Moving slowly or fidgety/restless 1 0 0  Suicidal thoughts 0 0 1  PHQ-9 Score 11 0 8  Difficult doing work/chores Somewhat difficult - Somewhat difficult    Social History   Tobacco Use  . Smoking status: Former Smoker    Packs/day: 0.25    Years: 5.00    Pack years: 1.25    Types: Cigarettes    Quit date: 07/25/2001    Years since quitting: 19.6  . Smokeless tobacco: Never Used  Vaping Use  . Vaping Use: Never used  Substance Use  Topics  . Alcohol use: Yes    Alcohol/week: 0.0 standard drinks    Comment: 2 glasses of wine per week  . Drug use: No    Review of Systems Per HPI unless specifically indicated above     Objective:    Ht 5\' 2"  (1.575 m)   Wt 207 lb 12.8 oz (94.3 kg)   BMI 38.01 kg/m   Wt Readings from Last 3 Encounters:  03/08/21 207 lb 12.8 oz (94.3 kg)  03/02/21 208 lb (94.3 kg)  10/07/20 212 lb (96.2 kg)    Physical Exam Vitals and nursing note reviewed.  Constitutional:      General: She is not in acute distress.    Appearance: She is well-developed. She is not diaphoretic.     Comments: Well-appearing, comfortable, cooperative  HENT:     Head: Normocephalic and atraumatic.  Eyes:     General:        Right eye: No discharge.        Left eye: No discharge.     Conjunctiva/sclera: Conjunctivae normal.  Cardiovascular:     Rate and Rhythm: Normal rate.  Pulmonary:     Effort: Pulmonary effort is normal.  Skin:    General: Skin is warm and dry.     Findings: No erythema or rash.  Neurological:  Mental Status: She is alert and oriented to person, place, and time.  Psychiatric:        Behavior: Behavior normal.     Comments: Well groomed, good eye contact, normal speech and thoughts    Results for orders placed or performed during the hospital encounter of 09/03/20  Urinalysis, Complete w Microscopic Urine, Clean Catch  Result Value Ref Range   Color, Urine YELLOW YELLOW   APPearance HAZY (A) CLEAR   Specific Gravity, Urine 1.010 1.005 - 1.030   pH 6.0 5.0 - 8.0   Glucose, UA NEGATIVE NEGATIVE mg/dL   Hgb urine dipstick NEGATIVE NEGATIVE   Bilirubin Urine NEGATIVE NEGATIVE   Ketones, ur NEGATIVE NEGATIVE mg/dL   Protein, ur NEGATIVE NEGATIVE mg/dL   Nitrite POSITIVE (A) NEGATIVE   Leukocytes,Ua NEGATIVE NEGATIVE   Squamous Epithelial / LPF 0-5 0 - 5   WBC, UA 0-5 0 - 5 WBC/hpf   RBC / HPF 0-5 0 - 5 RBC/hpf   Bacteria, UA FEW (A) NONE SEEN   WBC Clumps PRESENT        Assessment & Plan:   Problem List Items Addressed This Visit    Seizures ([place])    Other Visit Diagnoses    Moderate episode of recurrent major depressive disorder (HCC)    -  Primary   Relevant Medications   FLUoxetine (PROZAC) 20 MG capsule      Recently uncontrolled mood Previous telemedicine psychiatry switched her from Fluoxetine to Zoloft, did not do well had side effect intolerance, has returned to Fluoxetine.  Current dosage Fluoxetine 40mg  daily, 20mg  cap x 2 = 40mg   Today advised her to dose increase to Fluoxetine 60mg  (x 3 of the 20mg ) = 60mg . No new rx.  Trial for 2 weeks if improved continue this dose, we can change the rx for refills in future 40mg  + 20mg  = 60mg   Or if not optimal result, we can switch and taper SSRI Fluoxetine off and on to Venlafaxine 37.5mg  medication  She can contact office  For her questions on Seizure medication keppra, she needs to contact her Neurology office [facility].  No orders of the defined types were placed in this encounter.     Follow up plan: Return if symptoms worsen or fail to improve.   Nobie Putnam, [facility] 03/08/2021, 3:30 PM

## 2021-03-08 NOTE — Patient Instructions (Addendum)
Thank you for coming to the office today.  Increase Fluoxetine dose - from previous dose 40mg  (2 x 20mg ) now go up to 60mg  - 20mg  x 3 = 60mg . From previous 40mg  dose.  If you need me to order a new dosage of 40mg  + 20mg  = 60mg  I can do that let me know.  If after 2 weeks approximately, no improvement, and you are ready to switch.  Plan B option listed as below:  Medication Taper Off Instructions Current med - Fluoxetine 20mg   Week 1: Decrease dose to 40mg  - take 40mg  daily for first week. Week 2: Decrease dose to 20mg  every OTHER day - take 40mg  then next day take 20mg  Week 3: Decrease dose to 20mg  every day Week 4: Decrease to 20mg  every 3rd day, and phase off of this after that week  STOP med  START new med Venlafaxine 37.5mg  once daily. After 2-4 weeks we can increase dose to 75mg    Please schedule a Follow-up Appointment to: Return if symptoms worsen or fail to improve.  If you have any other questions or concerns, please feel free to call the office or send a message through [facility]. You may also schedule an earlier appointment if necessary.  Additionally, you may be receiving a survey about your experience at our office within a few days to 1 week by e-mail or mail. We value your feedback.  Nobie Putnam, DO [facility]

## 2021-03-09 ENCOUNTER — Encounter: Payer: Self-pay | Admitting: Internal Medicine

## 2021-03-14 DIAGNOSIS — F331 Major depressive disorder, recurrent, moderate: Secondary | ICD-10-CM

## 2021-03-14 MED ORDER — VENLAFAXINE HCL ER 37.5 MG PO CP24: 37.5000 mg | ORAL_CAPSULE | Freq: Every day | ORAL | 1 refills | Status: DC

## 2021-03-16 ENCOUNTER — Other Ambulatory Visit: Payer: Self-pay

## 2021-03-16 ENCOUNTER — Ambulatory Visit (AMBULATORY_SURGERY_CENTER): Payer: Medicare HMO | Admitting: Internal Medicine

## 2021-03-16 ENCOUNTER — Encounter: Payer: Self-pay | Admitting: Internal Medicine

## 2021-03-16 VITALS — BP 127/55 | HR 61 | Temp 97.7°F | Resp 9 | Ht 62.0 in | Wt 208.0 lb

## 2021-03-16 DIAGNOSIS — G4733 Obstructive sleep apnea (adult) (pediatric): Secondary | ICD-10-CM | POA: Diagnosis not present

## 2021-03-16 DIAGNOSIS — D120 Benign neoplasm of cecum: Secondary | ICD-10-CM | POA: Diagnosis not present

## 2021-03-16 DIAGNOSIS — Z860100 Personal history of colon polyps, unspecified: Secondary | ICD-10-CM

## 2021-03-16 DIAGNOSIS — F329 Major depressive disorder, single episode, unspecified: Secondary | ICD-10-CM | POA: Diagnosis not present

## 2021-03-16 MED ORDER — SODIUM CHLORIDE 0.9 % IV SOLN: 500.0000 mL | Freq: Once | INTRAVENOUS | Status: DC

## 2021-03-16 NOTE — Progress Notes (Signed)
Pt's states no medical or surgical changes since previsit or office visit.   VS taken by CW 

## 2021-03-16 NOTE — Progress Notes (Signed)
Right at cecum time 1038, pt started wretching.  They did use a little pressure to get to cecum but not much.  Sedation halted and oropharynx suctioned until pt awake and able to follow commands to swallow.

## 2021-03-16 NOTE — Progress Notes (Signed)
Report to PACU, RN, vss, BBS= Clear.  

## 2021-03-16 NOTE — Progress Notes (Signed)
Called to room to assist during endoscopic procedure.  Patient ID and intended procedure confirmed with present staff. Received instructions for my participation in the procedure from the performing physician.  

## 2021-03-16 NOTE — Op Note (Signed)
[facility] Patient Name: [name] Procedure Date: 03/16/2021 10:22 AM MRN: [id number] Endoscopist: Jerene Bears , MD Age: 65 Referring MD:  Date of Birth: [birth year] Gender: Female Account #: [id number] Procedure:                Colonoscopy Indications:              High risk colon cancer surveillance: Personal                            history of non-advanced adenoma, Last colonoscopy:                            October 2016 Medicines:                Monitored Anesthesia Care Procedure:                Pre-Anesthesia Assessment:                           - Prior to the procedure, a History and Physical                            was performed, and patient medications and                            allergies were reviewed. The patient's tolerance of                            previous anesthesia was also reviewed. The risks                            and benefits of the procedure and the sedation                            options and risks were discussed with the patient.                            All questions were answered, and informed consent                            was obtained. Prior Anticoagulants: The patient has                            taken no previous anticoagulant or antiplatelet                            agents. ASA Grade Assessment: II - A patient with                            mild systemic disease. After reviewing the risks                            and benefits, the patient was deemed in  satisfactory condition to undergo the procedure.                           After obtaining informed consent, the colonoscope                            was passed under direct vision. Throughout the                            procedure, the patient's blood pressure, pulse, and                            oxygen saturations were monitored continuously. The                            Olympus [id number]-[id number] (#[id number]) Colonoscope was                             introduced through the anus and advanced to the                            cecum, identified by appendiceal orifice and                            ileocecal valve. The colonoscopy was performed                            without difficulty. The patient tolerated the                            procedure well. The quality of the bowel                            preparation was good. The ileocecal valve,                            appendiceal orifice, and rectum were photographed. Scope In: 10:33:43 AM Scope Out: 10:47:27 AM Scope Withdrawal Time: 0 hours 8 minutes 48 seconds  Total Procedure Duration: 0 hours 13 minutes 44 seconds  Findings:                 The digital rectal exam was normal.                           A 2 mm polyp was found in the cecum. The polyp was                            sessile. The polyp was removed with a cold biopsy                            forceps. Resection and retrieval were complete.                           The exam was otherwise without abnormality on  direct and retroflexion views. Complications:            No immediate complications. Estimated Blood Loss:     Estimated blood loss was minimal. Impression:               - One 2 mm polyp in the cecum, removed with a cold                            biopsy forceps. Resected and retrieved.                           - The examination was otherwise normal on direct                            and retroflexion views. Recommendation:           - Patient has a contact number available for                            emergencies. The signs and symptoms of potential                            delayed complications were discussed with the                            patient. Return to normal activities tomorrow.                            Written discharge instructions were provided to the                            patient.                           - Resume previous  diet.                           - Continue present medications.                           - Await pathology results.                           - Repeat colonoscopy is recommended for                            surveillance. The colonoscopy date will be                            determined after pathology results from today's                            exam become available for review. Jerene Bears, MD 03/16/2021 10:49:21 AM This report has been signed electronically.

## 2021-03-16 NOTE — Patient Instructions (Signed)
HANDOUTS PROVIDED ON: POLYPS  The polyp removed today have been sent for pathology.  The results can take 1-3 weeks to receive.  When your next colonoscopy should occur will be based on the pathology results.    You may resume your previous diet and medication schedule.  Thank you for allowing [place] to care for you today!!!   YOU HAD AN ENDOSCOPIC PROCEDURE TODAY AT [facility]:   Refer to the procedure report that was given to you for any specific questions about what was found during the examination.  If the procedure report does not answer your questions, please call your gastroenterologist to clarify.  If you requested that your care partner not be given the details of your procedure findings, then the procedure report has been included in a sealed envelope for you to review at your convenience later.  YOU SHOULD EXPECT: Some feelings of bloating in the abdomen. Passage of more gas than usual.  Walking can help get rid of the air that was put into your GI tract during the procedure and reduce the bloating. If you had a lower endoscopy (such as a colonoscopy or flexible sigmoidoscopy) you may notice spotting of blood in your stool or on the toilet paper. If you underwent a bowel prep for your procedure, you may not have a normal bowel movement for a few days.  Please Note:  You might notice some irritation and congestion in your nose or some drainage.  This is from the oxygen used during your procedure.  There is no need for concern and it should clear up in a day or so.  SYMPTOMS TO REPORT IMMEDIATELY:   Following lower endoscopy (colonoscopy or flexible sigmoidoscopy):  Excessive amounts of blood in the stool  Significant tenderness or worsening of abdominal pains  Swelling of the abdomen that is new, acute  Fever of 100F or higher  For urgent or emergent issues, a gastroenterologist can be reached at any hour by calling [phone]. Do not use MyChart messaging for  urgent concerns.    DIET:  We do recommend a small meal at first, but then you may proceed to your regular diet.  Drink plenty of fluids but you should avoid alcoholic beverages for 24 hours.  ACTIVITY:  You should plan to take it easy for the rest of today and you should NOT DRIVE or use heavy machinery until tomorrow (because of the sedation medicines used during the test).    FOLLOW UP: Our staff will call the number listed on your records Friday morning between 7:15 am and 8:15 am following your procedure to check on you and address any questions or concerns that you may have regarding the information given to you following your procedure. If we do not reach you, we will leave a message.  We will attempt to reach you two times.  During this call, we will ask if you have developed any symptoms of COVID 19. If you develop any symptoms (ie: fever, flu-like symptoms, shortness of breath, cough etc.) before then, please call [phone].  If you test positive for Covid 19 in the 2 weeks post procedure, please call and report this information to [place].    If any biopsies were taken you will be contacted by phone or by letter within the next 1-3 weeks.  Please call us at [phone] if you have not heard about the biopsies in 3 weeks.    SIGNATURES/CONFIDENTIALITY: You and/or your care partner have signed  paperwork which will be entered into your electronic medical record.  These signatures attest to the fact that that the information above on your After Visit Summary has been reviewed and is understood.  Full responsibility of the confidentiality of this discharge information lies with you and/or your care-partner.

## 2021-03-18 ENCOUNTER — Encounter: Payer: Self-pay | Admitting: Internal Medicine

## 2021-03-18 ENCOUNTER — Telehealth: Payer: Self-pay | Admitting: *Deleted

## 2021-03-18 NOTE — Telephone Encounter (Signed)
  Follow up Call-  Call back number 03/16/2021  Post procedure Call Back phone  # [phone]  Permission to leave phone message Yes  Some recent data might be hidden     Patient questions:  Do you have a fever, pain , or abdominal swelling? No. Pain Score  0 *  Have you tolerated food without any problems? Yes.    Have you been able to return to your normal activities? Yes.    Do you have any questions about your discharge instructions: Diet   No. Medications  No. Follow up visit  No.  Do you have questions or concerns about your Care? No.  Actions: * If pain score is 4 or above: No action needed, pain <4.  Have you developed a fever since your procedure? no  2.   Have you had an respiratory symptoms (SOB or cough) since your procedure? no  3.   Have you tested positive for COVID 19 since your procedure no  4.   Have you had any family members/close contacts diagnosed with the COVID 19 since your procedure?  no   If yes to any of these questions please route to Joylene John, RN and Joella Prince, RN

## 2021-03-22 ENCOUNTER — Telehealth: Payer: Self-pay | Admitting: Neurology

## 2021-03-22 NOTE — Telephone Encounter (Signed)
Returned patient's call.  Dr. Tobey Grim first available is July 21 and she wishes to be seen sooner.  She requested to be seen by another provider to get in sooner.    Message sent to Dr. Jannifer Franklin for advisement.

## 2021-03-22 NOTE — Telephone Encounter (Signed)
Patient called in wanting to schedule an appt with Dr. Jannifer Franklin. Stated she feels as though the [place] she is on is not working properly and requesting to see Dr. Jannifer Franklin. Last seen in 2018 with Dr. Jannifer Franklin

## 2021-04-12 ENCOUNTER — Telehealth: Payer: Self-pay

## 2021-04-12 ENCOUNTER — Ambulatory Visit: Payer: Medicare HMO

## 2021-04-12 ENCOUNTER — Telehealth: Payer: Self-pay | Admitting: Family Medicine

## 2021-04-12 NOTE — Telephone Encounter (Signed)
Pt is calling to reschedule AWV. Please advise CB- [phone]

## 2021-04-12 NOTE — Telephone Encounter (Signed)
This nurse attempted to call patient three times in regards to scheduled telephonic AWV. Called at [place], [phone], and [phone]. Message left that we will call to reschedule for another time.

## 2021-04-14 ENCOUNTER — Encounter: Payer: Self-pay | Admitting: Family Medicine

## 2021-04-14 ENCOUNTER — Telehealth (INDEPENDENT_AMBULATORY_CARE_PROVIDER_SITE_OTHER): Payer: Medicare HMO | Admitting: Family Medicine

## 2021-04-14 ENCOUNTER — Other Ambulatory Visit: Payer: Self-pay

## 2021-04-14 VITALS — Ht 62.0 in | Wt 197.5 lb

## 2021-04-14 DIAGNOSIS — I6930 Unspecified sequelae of cerebral infarction: Secondary | ICD-10-CM | POA: Diagnosis not present

## 2021-04-14 DIAGNOSIS — F331 Major depressive disorder, recurrent, moderate: Secondary | ICD-10-CM | POA: Diagnosis not present

## 2021-04-14 DIAGNOSIS — I69020 Aphasia following nontraumatic subarachnoid hemorrhage: Secondary | ICD-10-CM | POA: Diagnosis not present

## 2021-04-14 NOTE — Patient Instructions (Addendum)
Notify office if / when ready to increase dose to 150mg  capsule once daily. We can order this, or can order the 75mg  if going to run out of med. Since doubling 37.5mg   Please schedule a Follow-up Appointment to: Return if symptoms worsen or fail to improve.  If you have any other questions or concerns, please feel free to call the office or send a message through [place]. You may also schedule an earlier appointment if necessary.  Additionally, you may be receiving a survey about your experience at our office within a few days to 1 week by e-mail or mail. We value your feedback.  Nobie Putnam, DO [place]

## 2021-04-14 NOTE — Progress Notes (Signed)
Virtual Visit via Telephone The purpose of this virtual visit is to provide medical care while limiting exposure to the novel coronavirus (COVID19) for both patient and office staff.  Consent was obtained for phone visit:  Yes.   Answered questions that patient had about telehealth interaction:  Yes.   I discussed the limitations, risks, security and privacy concerns of performing an evaluation and management service by telephone. I also discussed with the patient that there may be a patient responsible charge related to this service. The patient expressed understanding and agreed to proceed.  Patient Location: Home Provider Location: [facility] (Office)  Participants in virtual visit: - Patient: [name]. [name]  - CMA: Orinda Kenner, CMA - Provider: Dr Parks Ranger  ---------------------------------------------------------------------- Chief Complaint  Patient presents with   Depression    S: Reviewed CMA documentation. I have called patient and gathered additional HPI as follows:   History of SAH / CVA with residual deficit / Memory Loss / OSA   Major Depression, recurrent Seizure Disorder  Upcoming apt [facility] Dr Jannifer Franklin 04/28/21, in about 2 weeks.  Last visit with me 03/08/21. See report.  Questions regarding symptoms of cognitive decline vs aphasia and depression/mood mixed symptoms with fatigue if effect of SSRI Fluoxetine vs med withdrawal vs history CVA / SAH  She was tapered off Fluoxetine. Then on Initially Venlafaxine 37.5mg , increased to double dose for past 2 weeks (75mg  daily)  She still has some withdrawal symptoms from off Fluoxetine.  Today doing well on Venlafaxine 75mg  daily XR (x 2 of the 37.5mg ) She is interested and inquiring on dose increase  She feels the Venlafaxine seems to help her mood and feeling down. Seems like her fatigue is improved. She occasionally still feels down. Questioning if weather.  She keeps track of dates, some  difficulty processing this information  Concern if possible statin impact her cognition  Denies any fevers, chills, sweats, body ache, cough, shortness of breath, sinus pain or pressure, headache, abdominal pain, diarrhea  Past Medical History:  Diagnosis Date   Allergy    Anxiety    Arthritis    left hip   Blood transfusion without reported diagnosis    with hip surgery    GERD (gastroesophageal reflux disease)    Hepatitis 1972   hep B   Hyperlipidemia    controlled on meds    Memory disorder 09/21/2017   OSA (obstructive sleep apnea) 05/08/2018   PONV (postoperative nausea and vomiting)    Seizures ([place])    last seizure 11-2016   Sleep apnea    no cpap    Stroke ([place])    per pt    Social History   Tobacco Use   Smoking status: Former    Packs/day: 0.25    Years: 5.00    Pack years: 1.25    Types: Cigarettes    Quit date: 07/25/2001    Years since quitting: 19.7   Smokeless tobacco: Never  Vaping Use   Vaping Use: Never used  Substance Use Topics   Alcohol use: Yes    Alcohol/week: 0.0 standard drinks    Comment: 2 glasses of wine per week   Drug use: No    Current Outpatient Medications:    acetaminophen (TYLENOL) 500 MG tablet, Take 2 tablets (1,000 mg total) by mouth every 6 (six) hours as needed for mild pain or moderate pain., Disp: 30 tablet, Rfl: 0   B Complex Vitamins (VITAMIN-B COMPLEX PO), Take by mouth daily.,  Disp: , Rfl:    levETIRAcetam (KEPPRA) 750 MG tablet, Take 1 tablet (750 mg total) by mouth 2 (two) times daily., Disp: 180 tablet, Rfl: 4   Melatonin 3 MG TABS, Take 6 mg by mouth at bedtime. , Disp: , Rfl:    Multiple Vitamin (MULTIVITAMIN WITH MINERALS) TABS, Take 1 tablet by mouth daily. , Disp: , Rfl:    Omega-3 Fatty Acids (FISH OIL) 1200 MG CAPS, Take 1 capsule by mouth 2 (two) times daily., Disp: , Rfl:    rosuvastatin (CRESTOR) 10 MG tablet, TAKE 1 TABLET (10 MG TOTAL) BY MOUTH AT BEDTIME., Disp: 90 tablet, Rfl: 3   venlafaxine XR  (EFFEXOR XR) 37.5 MG 24 hr capsule, Take 1 capsule (37.5 mg total) by mouth daily with breakfast., Disp: 90 capsule, Rfl: 1   aspirin EC 81 MG tablet, Take 81 mg by mouth daily. (Patient not taking: No sig reported), Disp: , Rfl:   Depression screen [facility] 2/9 03/08/2021 08/19/2020 06/21/2020  Decreased Interest 2 0 1  Down, Depressed, Hopeless 2 0 1  PHQ - 2 Score 4 0 2  Altered sleeping 1 0 2  Tired, decreased energy 1 0 1  Change in appetite 1 0 0  Feeling bad or failure about yourself  1 0 0  Trouble concentrating 2 0 2  Moving slowly or fidgety/restless 1 0 0  Suicidal thoughts 0 0 1  PHQ-9 Score 11 0 8  Difficult doing work/chores Somewhat difficult - Somewhat difficult    GAD 7 : Generalized Anxiety Score 03/08/2021 08/19/2020 06/21/2020 05/05/2020  Nervous, Anxious, on Edge 2 0 1 1  Control/stop worrying 2 0 1 1  Worry too much - different things 2 0 0 0  Trouble relaxing 2 0 1 1  Restless 1 0 1 0  Easily annoyed or irritable 1 0 1 0  Afraid - awful might happen 1 0 2 2  Total GAD 7 Score 11 0 7 5  Anxiety Difficulty Somewhat difficult - Not difficult at all Somewhat difficult    -------------------------------------------------------------------------- O: No physical exam performed due to remote telephone encounter.  Lab results reviewed.  No results found for this or any previous visit (from the past 2160 hour(s)).  -------------------------------------------------------------------------- A&P:  Problem List Items Addressed This Visit     History of hemorrhagic cerebrovascular accident (CVA) with residual deficit   Aphasia due to and not concurrent with nontraumatic subarachnoid hemorrhage   Other Visit Diagnoses     Moderate episode of recurrent major depressive disorder ([place])    -  Primary      Mixed constellation of symptoms with depression recurrent mixed with questions of cognitive decline vs aphasia secondary to CVA / [place] in past  Improved mood off  Fluoxetine and on SNRI Venlafaxine now 75mg  daily XR - (x2 of the 37.5mg  pills)  Notify office if / when ready to increase dose to 150mg  capsule once daily. We can order this, or can order the 75mg  if going to run out of med. Since doubling 37.5mg   She should follow up with Dr Jannifer Franklin [facility] on 7/21 to further eval cognitive impact from CVA hx  No orders of the defined types were placed in this encounter.   Follow-up: - Return in 4-6 weeks as needed f/u after Neuro  Patient verbalizes understanding with the above medical recommendations including the limitation of remote medical advice.  Specific follow-up and call-back criteria were given for patient to follow-up or seek medical care more urgently  if needed.   - Time spent in direct consultation with patient on phone: 11 minutes  Nobie Putnam, [facility] 04/14/2021, 4:36 PM

## 2021-04-17 ENCOUNTER — Other Ambulatory Visit: Payer: Self-pay | Admitting: Family Medicine

## 2021-04-17 DIAGNOSIS — E7800 Pure hypercholesterolemia, unspecified: Secondary | ICD-10-CM

## 2021-04-17 NOTE — Telephone Encounter (Signed)
Future visit in 2 days

## 2021-04-19 ENCOUNTER — Ambulatory Visit (INDEPENDENT_AMBULATORY_CARE_PROVIDER_SITE_OTHER): Payer: Medicare HMO

## 2021-04-19 VITALS — Ht 62.0 in | Wt 197.0 lb

## 2021-04-19 DIAGNOSIS — Z0000 Encounter for general adult medical examination without abnormal findings: Secondary | ICD-10-CM

## 2021-04-19 NOTE — Patient Instructions (Signed)
[name] , Thank you for taking time to come for your Medicare Wellness Visit. I appreciate your ongoing commitment to your health goals. Please review the following plan we discussed and let me know if I can assist you in the future.   Screening recommendations/referrals: Colonoscopy: completed 03/16/2021 Mammogram: completed 02/09/2021 Bone Density: n/a Recommended yearly ophthalmology/optometry visit for glaucoma screening and checkup Recommended yearly dental visit for hygiene and checkup  Vaccinations: Influenza vaccine: completed 06/21/2020, due 05/09/2021 Pneumococcal vaccine: n/a Tdap vaccine: completed 04/28/2011, due 04/27/2021 Shingles vaccine: discussed  Covid-19:  01/21/2021, 07/19/2020, 12/31/2019, 12/09/2019  Advanced directives: Advance directive discussed with you today.   Conditions/risks identified: none  Next appointment: Follow up in one year for your annual wellness visit.   Preventive Care 40-64 Years, Female Preventive care refers to lifestyle choices and visits with your health care provider that can promote health and wellness. What does preventive care include? A yearly physical exam. This is also called an annual well check. Dental exams once or twice a year. Routine eye exams. Ask your health care provider how often you should have your eyes checked. Personal lifestyle choices, including: Daily care of your teeth and gums. Regular physical activity. Eating a healthy diet. Avoiding tobacco and drug use. Limiting alcohol use. Practicing safe sex. Taking low-dose aspirin daily starting at age 80. Taking vitamin and mineral supplements as recommended by your health care provider. What happens during an annual well check? The services and screenings done by your health care provider during your annual well check will depend on your age, overall health, lifestyle risk factors, and family history of disease. Counseling  Your health care provider may ask you  questions about your: Alcohol use. Tobacco use. Drug use. Emotional well-being. Home and relationship well-being. Sexual activity. Eating habits. Work and work Statistician. Method of birth control. Menstrual cycle. Pregnancy history. Screening  You may have the following tests or measurements: Height, weight, and BMI. Blood pressure. Lipid and cholesterol levels. These may be checked every 5 years, or more frequently if you are over 51 years old. Skin check. Lung cancer screening. You may have this screening every year starting at age 42 if you have a 30-pack-year history of smoking and currently smoke or have quit within the past 15 years. Fecal occult blood test (FOBT) of the stool. You may have this test every year starting at age 1. Flexible sigmoidoscopy or colonoscopy. You may have a sigmoidoscopy every 5 years or a colonoscopy every 10 years starting at age 38. Hepatitis C blood test. Hepatitis B blood test. Sexually transmitted disease (STD) testing. Diabetes screening. This is done by checking your blood sugar (glucose) after you have not eaten for a while (fasting). You may have this done every 1-3 years. Mammogram. This may be done every 1-2 years. Talk to your health care provider about when you should start having regular mammograms. This may depend on whether you have a family history of breast cancer. BRCA-related cancer screening. This may be done if you have a family history of breast, ovarian, tubal, or peritoneal cancers. Pelvic exam and Pap test. This may be done every 3 years starting at age 76. Starting at age 22, this may be done every 5 years if you have a Pap test in combination with an HPV test. Bone density scan. This is done to screen for osteoporosis. You may have this scan if you are at high risk for osteoporosis. Discuss your test results, treatment options, and if necessary,  the need for more tests with your health care provider. Vaccines  Your health  care provider may recommend certain vaccines, such as: Influenza vaccine. This is recommended every year. Tetanus, diphtheria, and acellular pertussis (Tdap, Td) vaccine. You may need a Td booster every 10 years. Zoster vaccine. You may need this after age 60. Pneumococcal 13-valent conjugate (PCV13) vaccine. You may need this if you have certain conditions and were not previously vaccinated. Pneumococcal polysaccharide (PPSV23) vaccine. You may need one or two doses if you smoke cigarettes or if you have certain conditions. Talk to your health care provider about which screenings and vaccines you need and how often you need them. This information is not intended to replace advice given to you by your health care provider. Make sure you discuss any questions you have with your health care provider. Document Released: 10/22/2015 Document Revised: 06/14/2016 Document Reviewed: 07/27/2015 Elsevier Interactive Patient Education  2017 Elsevier Inc.    Fall Prevention in the Home Falls can cause injuries. They can happen to people of all ages. There are many things you can do to make your home safe and to help prevent falls. What can I do on the outside of my home? Regularly fix the edges of walkways and driveways and fix any cracks. Remove anything that might make you trip as you walk through a door, such as a raised step or threshold. Trim any bushes or trees on the path to your home. Use bright outdoor lighting. Clear any walking paths of anything that might make someone trip, such as rocks or tools. Regularly check to see if handrails are loose or broken. Make sure that both sides of any steps have handrails. Any raised decks and porches should have guardrails on the edges. Have any leaves, snow, or ice cleared regularly. Use sand or salt on walking paths during winter. Clean up any spills in your garage right away. This includes oil or grease spills. What can I do in the bathroom? Use  night lights. Install grab bars by the toilet and in the tub and shower. Do not use towel bars as grab bars. Use non-skid mats or decals in the tub or shower. If you need to sit down in the shower, use a plastic, non-slip stool. Keep the floor dry. Clean up any water that spills on the floor as soon as it happens. Remove soap buildup in the tub or shower regularly. Attach bath mats securely with double-sided non-slip rug tape. Do not have throw rugs and other things on the floor that can make you trip. What can I do in the bedroom? Use night lights. Make sure that you have a light by your bed that is easy to reach. Do not use any sheets or blankets that are too big for your bed. They should not hang down onto the floor. Have a firm chair that has side arms. You can use this for support while you get dressed. Do not have throw rugs and other things on the floor that can make you trip. What can I do in the kitchen? Clean up any spills right away. Avoid walking on wet floors. Keep items that you use a lot in easy-to-reach places. If you need to reach something above you, use a strong step stool that has a grab bar. Keep electrical cords out of the way. Do not use floor polish or wax that makes floors slippery. If you must use wax, use non-skid floor wax. Do not have throw rugs   and other things on the floor that can make you trip. What can I do with my stairs? Do not leave any items on the stairs. Make sure that there are handrails on both sides of the stairs and use them. Fix handrails that are broken or loose. Make sure that handrails are as long as the stairways. Check any carpeting to make sure that it is firmly attached to the stairs. Fix any carpet that is loose or worn. Avoid having throw rugs at the top or bottom of the stairs. If you do have throw rugs, attach them to the floor with carpet tape. Make sure that you have a light switch at the top of the stairs and the bottom of the  stairs. If you do not have them, ask someone to add them for you. What else can I do to help prevent falls? Wear shoes that: Do not have high heels. Have rubber bottoms. Are comfortable and fit you well. Are closed at the toe. Do not wear sandals. If you use a stepladder: Make sure that it is fully opened. Do not climb a closed stepladder. Make sure that both sides of the stepladder are locked into place. Ask someone to hold it for you, if possible. Clearly mark and make sure that you can see: Any grab bars or handrails. First and last steps. Where the edge of each step is. Use tools that help you move around (mobility aids) if they are needed. These include: Canes. Walkers. Scooters. Crutches. Turn on the lights when you go into a dark area. Replace any light bulbs as soon as they burn out. Set up your furniture so you have a clear path. Avoid moving your furniture around. If any of your floors are uneven, fix them. If there are any pets around you, be aware of where they are. Review your medicines with your doctor. Some medicines can make you feel dizzy. This can increase your chance of falling. Ask your doctor what other things that you can do to help prevent falls. This information is not intended to replace advice given to you by your health care provider. Make sure you discuss any questions you have with your health care provider. Document Released: 07/22/2009 Document Revised: 03/02/2016 Document Reviewed: 10/30/2014 Elsevier Interactive Patient Education  2017 Elsevier Inc.  

## 2021-04-19 NOTE — Progress Notes (Signed)
I connected with [name] today by telephone and verified that I am speaking with the correct person using two identifiers. Location patient: home Location provider: work Persons participating in the virtual visit: Noya, Santarelli LPN.   I discussed the limitations, risks, security and privacy concerns of performing an evaluation and management service by telephone and the availability of in person appointments. I also discussed with the patient that there may be a patient responsible charge related to this service. The patient expressed understanding and verbally consented to this telephonic visit.    Interactive audio and video telecommunications were attempted between this provider and patient, however failed, due to patient having technical difficulties OR patient did not have access to video capability.  We continued and completed visit with audio only.     Vital signs may be patient reported or missing.  Subjective:   [name] is a 65 y.o. female who presents for an Initial Medicare Annual Wellness Visit.  Review of Systems     Cardiac Risk Factors include: dyslipidemia     Objective:    Today's Vitals   04/19/21 1316  Weight: 197 lb (89.4 kg)  Height: 5\' 2"  (1.575 m)   Body mass index is 36.03 kg/m.  Advanced Directives 04/19/2021 09/03/2020 10/22/2017 10/11/2017 10/08/2017 10/04/2017 04/08/2017  Does Patient Have a Medical Advance Directive? No No No No No Yes No  Type of Advance Directive - - Living will - - Living will -  Would patient like information on creating a medical advance directive? - - - - Yes (MAU/Ambulatory/Procedural Areas - Information given) - No - Patient declined  Pre-existing out of facility DNR order (yellow form or pink MOST form) - - - - - - -  Some encounter information is confidential and restricted. Go to Review Flowsheets activity to see all data.    Current Medications (verified) Outpatient Encounter Medications as of  04/19/2021  Medication Sig   acetaminophen (TYLENOL) 500 MG tablet Take 2 tablets (1,000 mg total) by mouth every 6 (six) hours as needed for mild pain or moderate pain.   B Complex Vitamins (VITAMIN-B COMPLEX PO) Take by mouth daily.   levETIRAcetam (KEPPRA) 750 MG tablet Take 1 tablet (750 mg total) by mouth 2 (two) times daily.   Melatonin 3 MG TABS Take 6 mg by mouth at bedtime.    Multiple Vitamin (MULTIVITAMIN WITH MINERALS) TABS Take 1 tablet by mouth daily.    Omega-3 Fatty Acids (FISH OIL) 1200 MG CAPS Take 1 capsule by mouth 2 (two) times daily.   rosuvastatin (CRESTOR) 10 MG tablet TAKE 1 TABLET (10 MG TOTAL) BY MOUTH AT BEDTIME.   venlafaxine XR (EFFEXOR XR) 37.5 MG 24 hr capsule Take 1 capsule (37.5 mg total) by mouth daily with breakfast.   aspirin EC 81 MG tablet Take 81 mg by mouth daily. (Patient not taking: No sig reported)   No facility-administered encounter medications on file as of 04/19/2021.    Allergies (verified) Patient has no known allergies.   History: Past Medical History:  Diagnosis Date   Allergy    Anxiety    Arthritis    left hip   Blood transfusion without reported diagnosis    with hip surgery    GERD (gastroesophageal reflux disease)    Hepatitis 1972   hep B   Hyperlipidemia    controlled on meds    Memory disorder 09/21/2017   OSA (obstructive sleep apnea) 05/08/2018   PONV (postoperative nausea and  vomiting)    Seizures ([place])    last seizure 11-2016   Sleep apnea    no cpap    Stroke [facility])    per pt    Past Surgical History:  Procedure Laterality Date   ABDOMINAL HYSTERECTOMY  04/1999   ARTHROSCOPIC REPAIR ACL     bilateral knees   BREAST EXCISIONAL BIOPSY Right 2008   NEG   BREAST EXCISIONAL BIOPSY Left YRS AGO   X2 - NEG   BREAST LUMPECTOMY  06/1979; 03/1995   left   CHOLECYSTECTOMY  09/1987   COLONOSCOPY     LAPAROSCOPIC GASTRIC SLEEVE RESECTION  09/30/2012   Procedure: LAPAROSCOPIC GASTRIC SLEEVE RESECTION;  Surgeon: Madilyn Hook, DO;  Location: [facility];  Service: General;  Laterality: N/A;   POLYPECTOMY     TOTAL HIP ARTHROPLASTY  11/13/2012   left   UPPER GASTROINTESTINAL ENDOSCOPY     UPPER GI ENDOSCOPY  09/30/2012   Procedure: UPPER GI ENDOSCOPY;  Surgeon: Madilyn Hook, DO;  Location: [facility];  Service: General;;   Family History  Problem Relation Age of Onset   Hyperlipidemia Mother    Obesity Mother    Pulmonary fibrosis Mother    Cancer Maternal Aunt        ovarian cancer   Cancer Father    Prostate cancer Father    Colon cancer Paternal Grandmother    Esophageal cancer Neg Hx    Stomach cancer Neg Hx    Rectal cancer Neg Hx    Breast cancer Neg Hx    Colon polyps Neg Hx    Social History   Socioeconomic History   Marital status: Married    Spouse name: Not on file   Number of children: 0   Years of education: BS   Highest education level: Not on file  Occupational History   Not on file  Tobacco Use   Smoking status: Former    Packs/day: 0.25    Years: 5.00    Pack years: 1.25    Types: Cigarettes    Quit date: 07/25/2001    Years since quitting: 19.7   Smokeless tobacco: Never  Vaping Use   Vaping Use: Never used  Substance and Sexual Activity   Alcohol use: Yes    Alcohol/week: 0.0 standard drinks    Comment: 2 glasses of wine per week   Drug use: No   Sexual activity: Not on file  Other Topics Concern   Not on file  Social History Narrative   Lives with spouse   Caffeine use: Drinks coffee daily   Right handed   Social Determinants of Health   Financial Resource Strain: Low Risk    Difficulty of Paying Living Expenses: Not hard at all  Food Insecurity: No Food Insecurity   Worried About Charity fundraiser in the Last Year: Never true   Ran Out of Food in the Last Year: Never true  Transportation Needs: No Transportation Needs   Lack of Transportation (Medical): No   Lack of Transportation (Non-Medical): No  Physical Activity: Inactive   Days of Exercise per  Week: 0 days   Minutes of Exercise per Session: 0 min  Stress: No Stress Concern Present   Feeling of Stress : Only a little  Social Connections: Not on file    Tobacco Counseling Counseling given: Not Answered   Clinical Intake:  Pre-visit preparation completed: Yes  Pain : No/denies pain     Nutritional Status: BMI > 30  Obese  Nutritional Risks: None Diabetes: No  How often do you need to have someone help you when you read instructions, pamphlets, or other written materials from your doctor or pharmacy?: 1 - Never What is the last grade level you completed in school?: undergraduate  Diabetic? no  Interpreter Needed?: No  Information entered by :: NAllen LPN   Activities of Daily Living In your present state of health, do you have any difficulty performing the following activities: 04/19/2021 06/21/2020  Hearing? N N  Vision? N N  Difficulty concentrating or making decisions? Y N  Walking or climbing stairs? N N  Dressing or bathing? N N  Doing errands, shopping? N N  Preparing Food and eating ? N -  Using the Toilet? N -  In the past six months, have you accidently leaked urine? N -  Do you have problems with loss of bowel control? N -  Managing your Medications? N -  Managing your Finances? N -  Housekeeping or managing your Housekeeping? N -  Some recent data might be hidden    Patient Care Team: Olin Hauser, DO as PCP - General (Family Medicine) Himmelrich, Bryson Ha, RD (Inactive) as Dietitian (Bariatrics)  Indicate any recent Medical Services you may have received from other than [facility] providers in the past year (date may be approximate).     Assessment:   This is a routine wellness examination for [place].  Hearing/Vision screen Vision Screening - Comments:: Regular eye exams, [facility]  Dietary issues and exercise activities discussed: Current Exercise Habits: The patient does not participate in regular exercise at present (gardening  every day)   Goals Addressed             This Visit's Progress    Patient Stated       04/19/2021, wants to weigh 160 pounds        Depression Screen PHQ 2/9 Scores 04/19/2021 03/08/2021 08/19/2020 06/21/2020 05/05/2020 06/20/2019 05/22/2018  PHQ - 2 Score 2 4 0 2 3 0 1  PHQ- 9 Score 9 11 0 8 11 2 9     Fall Risk Fall Risk  04/19/2021 03/08/2021 08/19/2020 06/20/2019 01/08/2017  Falls in the past year? 0 0 0 0 Yes  Number falls in past yr: - 0 - - 2 or more  Injury with Fall? - 0 - - Yes  Risk for fall due to : Medication side effect - - - -  Follow up Falls evaluation completed;Education provided;Falls prevention discussed Falls evaluation completed Falls evaluation completed Falls evaluation completed -    FALL RISK PREVENTION PERTAINING TO THE HOME:  Any stairs in or around the home? Yes  If so, are there any without handrails? No  Home free of loose throw rugs in walkways, pet beds, electrical cords, etc? Yes  Adequate lighting in your home to reduce risk of falls? Yes   ASSISTIVE DEVICES UTILIZED TO PREVENT FALLS:  Life alert? No  Use of a cane, walker or w/c? No  Grab bars in the bathroom? Yes  Shower chair or bench in shower? Yes  Elevated toilet seat or a handicapped toilet? Yes   TIMED UP AND GO:  Was the test performed? No .      Cognitive Function:     6CIT Screen 04/19/2021  What Year? 0 points  What month? 0 points  What time? 0 points  Count back from 20 0 points  Months in reverse 0 points  Repeat phrase 4 points  Total Score 4  Immunizations Immunization History  Administered Date(s) Administered   Influenza Inj Mdck Quad Pf 07/29/2018   Influenza Split 06/20/2012   Influenza,inj,Quad PF,6+ Mos 06/20/2019, 06/21/2020   Influenza-Unspecified 10/17/2016, 07/31/2017, 07/29/2018   PFIZER(Purple Top)SARS-COV-2 Vaccination 12/09/2019, 12/31/2019   Td 04/28/2011    TDAP status: Up to date  Flu Vaccine status: Up to date  Pneumococcal  vaccine status: Up to date  Covid-19 vaccine status: Completed vaccines  Qualifies for Shingles Vaccine? Yes   Zostavax completed No   Shingrix Completed?: No.    Education has been provided regarding the importance of this vaccine. Patient has been advised to call insurance company to determine out of pocket expense if they have not yet received this vaccine. Advised may also receive vaccine at local pharmacy or Health Dept. Verbalized acceptance and understanding.  Screening Tests Health Maintenance  Topic Date Due   Zoster Vaccines- Shingrix (1 of 2) Never done   COVID-19 Vaccine (3 - Booster for Pfizer series) 06/01/2020   TETANUS/TDAP  04/27/2021   INFLUENZA VACCINE  05/09/2021   MAMMOGRAM  02/10/2023   COLONOSCOPY (Pts 45-38yrs Insurance coverage will need to be confirmed)  03/16/2028   Hepatitis C Screening  Completed   HIV Screening  Completed   Pneumococcal Vaccine 49-1 Years old  Aged Out   HPV VACCINES  Aged Out   PAP SMEAR-Modifier  Discontinued    Health Maintenance  Health Maintenance Due  Topic Date Due   Zoster Vaccines- Shingrix (1 of 2) Never done   COVID-19 Vaccine (3 - Booster for Pfizer series) 06/01/2020    Colorectal cancer screening: Type of screening: Colonoscopy. Completed 03/16/2021. Repeat every 7 years  Mammogram status: Completed 02/09/2021. Repeat every year  Bone Density status: n/a  Lung Cancer Screening: (Low Dose CT Chest recommended if Age 68-80 years, 30 pack-year currently smoking OR have quit w/in 15years.) does not qualify.   Lung Cancer Screening Referral: no  Additional Screening:  Hepatitis C Screening: does qualify; Completed 06/18/2019  Vision Screening: Recommended annual ophthalmology exams for early detection of glaucoma and other disorders of the eye. Is the patient up to date with their annual eye exam?  Yes  Who is the provider or what is the name of the office in which the patient attends annual eye exams? [facility] If  pt is not established with a provider, would they like to be referred to a provider to establish care? No .   Dental Screening: Recommended annual dental exams for proper oral hygiene  Community Resource Referral / Chronic Care Management: CRR required this visit?  No   CCM required this visit?  No      Plan:     I have personally reviewed and noted the following in the patient's chart:   Medical and social history Use of alcohol, tobacco or illicit drugs  Current medications and supplements including opioid prescriptions. Patient is not currently taking opioid prescriptions. Functional ability and status Nutritional status Physical activity Advanced directives List of other physicians Hospitalizations, surgeries, and ER visits in previous 12 months Vitals Screenings to include cognitive, depression, and falls Referrals and appointments  In addition, I have reviewed and discussed with patient certain preventive protocols, quality metrics, and best practice recommendations. A written personalized care plan for preventive services as well as general preventive health recommendations were provided to patient.     Kellie Simmering, LPN   5/40/9811   Nurse Notes:

## 2021-04-28 ENCOUNTER — Ambulatory Visit: Payer: Medicare HMO | Admitting: Neurology

## 2021-04-28 ENCOUNTER — Encounter: Payer: Self-pay | Admitting: Neurology

## 2021-04-28 VITALS — BP 101/75 | HR 75 | Ht 62.0 in | Wt 198.0 lb

## 2021-04-28 DIAGNOSIS — R569 Unspecified convulsions: Secondary | ICD-10-CM

## 2021-04-28 MED ORDER — LAMOTRIGINE 25 MG PO TABS: ORAL_TABLET | ORAL | 1 refills | Status: DC

## 2021-04-28 NOTE — Patient Instructions (Signed)
We will start lamotrigine 25 mg tablets for the seizures:  One twice a day for 2 weeks, then take 2 twice a day for 2 weeks, then take 3 twice a day   Lamictal (lamotrigine) is a seizure medication that occasionally may be used for other purposes such as peripheral neuropathy pain or certain types of headache. This medication is relatively safe, but occasionally side effects can occur. A skin rash may occur when first starting the medication. As with any seizure medication, depression may worsen on the drug. Other potential side effects include dizziness, headache, drowsiness or insomnia, decreased concentration, or stomach upset. This medication may also be used as a mood stabilizer. If you believe that you are having side effects on the medication, please contact our office.

## 2021-04-28 NOTE — Progress Notes (Signed)
Reason for visit: Seizures  [name] is an 65 y.o. female  History of present illness:  [name] is a 47 year old right-handed white female with a history of a subarachnoid hemorrhage associated with a left frontal stroke in February 2018.  Within a year after the event, she began having seizures.  She has sleep apnea as well but is not on CPAP.  She has lost about 50 pounds of weight and she no longer snores at night.  She is on Keppra, but even on the long-acting preparation she is reporting issues with drowsiness and cognitive slowing.  The patient has recently switched off of Prozac and has been switched to Effexor, she is being treated for depression.  She has not had any further seizures, she is operating a motor vehicle well.  She wishes to come off of the [place].  She does report some mild gait instability at times, she has not had any falls.  Past Medical History:  Diagnosis Date   Allergy    Anxiety    Arthritis    left hip   Blood transfusion without reported diagnosis    with hip surgery    GERD (gastroesophageal reflux disease)    Hepatitis 1972   hep B   Hyperlipidemia    controlled on meds    Memory disorder 09/21/2017   OSA (obstructive sleep apnea) 05/08/2018   PONV (postoperative nausea and vomiting)    Seizures ([place])    last seizure 11-2016   Sleep apnea    no cpap    Stroke [facility])    per pt     Past Surgical History:  Procedure Laterality Date   ABDOMINAL HYSTERECTOMY  04/1999   ARTHROSCOPIC REPAIR ACL     bilateral knees   BREAST EXCISIONAL BIOPSY Right 2008   NEG   BREAST EXCISIONAL BIOPSY Left YRS AGO   X2 - NEG   BREAST LUMPECTOMY  06/1979; 03/1995   left   CHOLECYSTECTOMY  09/1987   COLONOSCOPY     LAPAROSCOPIC GASTRIC SLEEVE RESECTION  09/30/2012   Procedure: LAPAROSCOPIC GASTRIC SLEEVE RESECTION;  Surgeon: Madilyn Hook, DO;  [facility];  Service: General;  Laterality: N/A;   POLYPECTOMY     TOTAL HIP ARTHROPLASTY  11/13/2012    left   UPPER GASTROINTESTINAL ENDOSCOPY     UPPER GI ENDOSCOPY  09/30/2012   Procedure: UPPER GI ENDOSCOPY;  Surgeon: Madilyn Hook, DO;  Location[facility];  Service: General;;    Family History  Problem Relation Age of Onset   Hyperlipidemia Mother    Obesity Mother    Pulmonary fibrosis Mother    Cancer Maternal Aunt        ovarian cancer   Cancer Father    Prostate cancer Father    Colon cancer Paternal Grandmother    Esophageal cancer Neg Hx    Stomach cancer Neg Hx    Rectal cancer Neg Hx    Breast cancer Neg Hx    Colon polyps Neg Hx     Social history:  reports that she quit smoking about 19 years ago. Her smoking use included cigarettes. She has a 1.25 pack-year smoking history. She has never used smokeless tobacco. She reports current alcohol use. She reports that she does not use drugs.   No Known Allergies  Medications:  Prior to Admission medications   Medication Sig Start Date End Date Taking? Authorizing Provider  acetaminophen (TYLENOL) 500 MG tablet Take 2 tablets (1,000 mg total)  by mouth every 6 (six) hours as needed for mild pain or moderate pain. 04/21/15  Yes Betancourt, Aura Fey, NP  aspirin EC 81 MG tablet Take 81 mg by mouth daily.   Yes [provider]  B Complex Vitamins (VITAMIN-B COMPLEX PO) Take by mouth daily.   Yes [provider]  levETIRAcetam (KEPPRA) 750 MG tablet Take 1 tablet (750 mg total) by mouth 2 (two) times daily. 10/07/20  Yes Suzzanne Cloud, NP  Melatonin 3 MG TABS Take 6 mg by mouth at bedtime.    Yes [provider]  Multiple Vitamin (MULTIVITAMIN WITH MINERALS) TABS Take 1 tablet by mouth daily.    Yes [provider]  Omega-3 Fatty Acids (FISH OIL) 1200 MG CAPS Take 1 capsule by mouth 2 (two) times daily.   Yes [provider]  rosuvastatin (CRESTOR) 10 MG tablet TAKE 1 TABLET (10 MG TOTAL) BY MOUTH AT BEDTIME. 04/17/21  Yes Karamalegos, Devonne Doughty, DO  venlafaxine XR (EFFEXOR XR) 37.5 MG  24 hr capsule Take 1 capsule (37.5 mg total) by mouth daily with breakfast. Patient taking differently: Take 37.5 mg by mouth 2 (two) times daily. 03/14/21  Yes Karamalegos, Devonne Doughty, DO    ROS:  Out of a complete 14 system review of symptoms, the patient complains only of the following symptoms, and all other reviewed systems are negative.  Drowsiness Cognitive slowing Depression  Height 5\' 2"  (1.575 m), weight 198 lb (89.8 kg).  Physical Exam  General: The patient is alert and cooperative at the time of the examination.  Skin: No significant peripheral edema is noted.   Neurologic Exam  Mental status: The patient is alert and oriented x 3 at the time of the examination. The patient has apparent normal recent and remote memory, with an apparently normal attention span and concentration ability.   Cranial nerves: Facial symmetry is present. Speech is normal, no aphasia or dysarthria is noted. Extraocular movements are full. Visual fields are full.  Motor: The patient has good strength in all 4 extremities.  Sensory examination: Soft touch sensation is symmetric on the face, arms, and legs.  Coordination: The patient has good finger-nose-finger and heel-to-shin bilaterally.  Gait and station: The patient has a normal gait. Tandem gait is normal. Romberg is negative. No drift is seen.  Reflexes: Deep tendon reflexes are symmetric.   Assessment/Plan:  1.  History of seizures, well controlled  The patient is having side effects on Keppra.  We will add Lamictal at this time, working up to 75 mg twice daily.  The patient is to contact me at that point and we will convert her to the 100 mg Lamictal tablets twice daily, and begin a taper off of the Keppra.  We will eventually convert her to 150 mg twice daily of Lamictal and watch her on that dose.  The patient will follow-up otherwise in 6 months.  She will call if she has any concerns.  Jill Alexanders MD 04/28/2021 7:18  AM  [facility] [address] [place], [place] [address]  Phone [phone] Fax [phone]

## 2021-04-29 DIAGNOSIS — F3341 Major depressive disorder, recurrent, in partial remission: Secondary | ICD-10-CM

## 2021-04-29 MED ORDER — VENLAFAXINE HCL ER 150 MG PO CP24: 150.0000 mg | ORAL_CAPSULE | Freq: Every day | ORAL | 1 refills | Status: DC

## 2021-04-29 NOTE — Addendum Note (Signed)
Addended by: Olin Hauser on: 04/29/2021 04:50 PM   Modules accepted: Orders

## 2021-05-02 ENCOUNTER — Telehealth: Payer: Medicare HMO | Admitting: Family Medicine

## 2021-05-31 ENCOUNTER — Other Ambulatory Visit: Payer: Self-pay | Admitting: Neurology

## 2021-05-31 MED ORDER — LEVETIRACETAM 250 MG PO TABS: ORAL_TABLET | ORAL | 1 refills | Status: DC

## 2021-05-31 MED ORDER — LAMOTRIGINE 100 MG PO TABS: 100.0000 mg | ORAL_TABLET | Freq: Two times a day (BID) | ORAL | 3 refills | Status: DC

## 2021-06-06 DIAGNOSIS — Z0100 Encounter for examination of eyes and vision without abnormal findings: Secondary | ICD-10-CM | POA: Diagnosis not present

## 2021-06-07 ENCOUNTER — Other Ambulatory Visit: Payer: Self-pay

## 2021-06-07 MED ORDER — LAMOTRIGINE 100 MG PO TABS: 100.0000 mg | ORAL_TABLET | Freq: Two times a day (BID) | ORAL | 0 refills | Status: DC

## 2021-06-15 ENCOUNTER — Other Ambulatory Visit: Payer: Self-pay

## 2021-06-15 ENCOUNTER — Encounter: Payer: Self-pay | Admitting: Family Medicine

## 2021-06-15 ENCOUNTER — Ambulatory Visit (INDEPENDENT_AMBULATORY_CARE_PROVIDER_SITE_OTHER): Payer: Medicare HMO | Admitting: Family Medicine

## 2021-06-15 VITALS — BP 112/71 | HR 80 | Ht 62.0 in | Wt 198.0 lb

## 2021-06-15 DIAGNOSIS — M542 Cervicalgia: Secondary | ICD-10-CM | POA: Diagnosis not present

## 2021-06-15 DIAGNOSIS — S46812A Strain of other muscles, fascia and tendons at shoulder and upper arm level, left arm, initial encounter: Secondary | ICD-10-CM

## 2021-06-15 NOTE — Progress Notes (Signed)
Subjective:    Patient ID: [name], female    DOB: 01-21-1956, 65 y.o.   MRN: [id number]  [name] is a 65 y.o. female presenting on 06/15/2021 for Shoulder Pain  Patient presents for a same day appointment. Patient arrived in office asking for triage/evaluation. We had cancellation and patient was able to be seen.  HPI  Left Neck Pain Muscle strain vs spasm Reports new acute onset symptoms within past 15 min, she had some Left neck pain and spasm, describes pain and some limitation with turning rotating to left side only to provoke symptoms. She was worried about stroke initially and came here, she did not go to hospital. However on triage here she was found to not have any weakness, numbness, tingling, loss of vision or focal neuro deficits. She describes anxiety and panic make have contributed to muscle symptoms and pain. She is followed by [facility] closely, see prior history of CVA, Seizure  Depression screen [facility] 2/9 04/19/2021 03/08/2021 08/19/2020  Decreased Interest 0 2 0  Down, Depressed, Hopeless 2 2 0  PHQ - 2 Score 2 4 0  Altered sleeping 2 1 0  Tired, decreased energy 2 1 0  Change in appetite 0 1 0  Feeling bad or failure about yourself  1 1 0  Trouble concentrating 2 2 0  Moving slowly or fidgety/restless 0 1 0  Suicidal thoughts 0 0 0  PHQ-9 Score 9 11 0  Difficult doing work/chores Somewhat difficult Somewhat difficult -    Social History   Tobacco Use   Smoking status: Former    Packs/day: 0.25    Years: 5.00    Pack years: 1.25    Types: Cigarettes    Quit date: 07/25/2001    Years since quitting: 19.9   Smokeless tobacco: Never  Vaping Use   Vaping Use: Never used  Substance Use Topics   Alcohol use: Yes    Alcohol/week: 0.0 standard drinks    Comment: 2 glasses of wine per week   Drug use: No    Review of Systems Per HPI unless specifically indicated above     Objective:    BP 112/71   Pulse 80   Ht '5\' 2"'$   (1.575 m)   Wt 198 lb (89.8 kg)   SpO2 97%   BMI 36.21 kg/m   Wt Readings from Last 3 Encounters:  06/15/21 198 lb (89.8 kg)  04/28/21 198 lb (89.8 kg)  04/19/21 197 lb (89.4 kg)    Physical Exam Vitals and nursing note reviewed.  Constitutional:      General: She is not in acute distress.    Appearance: She is well-developed. She is not diaphoretic.     Comments: Well-appearing, comfortable, cooperative  HENT:     Head: Normocephalic and atraumatic.  Eyes:     General:        Right eye: No discharge.        Left eye: No discharge.     Conjunctiva/sclera: Conjunctivae normal.  Neck:     Thyroid: No thyromegaly.     Comments: Mild reduced ROM left rotation of neck with some pain has hypertonicity slight spasm of left trapezius with localized symptoms more distal in posterior shoulder area Cardiovascular:     Rate and Rhythm: Normal rate and regular rhythm.     Heart sounds: Normal heart sounds. No murmur heard. Pulmonary:     Effort: Pulmonary effort is normal. No respiratory distress.  Breath sounds: Normal breath sounds. No wheezing or rales.  Musculoskeletal:        General: Normal range of motion.  Lymphadenopathy:     Cervical: No cervical adenopathy.  Skin:    General: Skin is warm and dry.     Findings: No erythema or rash.  Neurological:     General: No focal deficit present.     Mental Status: She is alert and oriented to person, place, and time. Mental status is at baseline.     Cranial Nerves: No cranial nerve deficit.     Sensory: No sensory deficit.     Motor: No weakness.     Gait: Gait normal.  Psychiatric:        Behavior: Behavior normal.     Comments: Well groomed, good eye contact, normal speech and thoughts     Results for orders placed or performed during the hospital encounter of 09/03/20  Urinalysis, Complete w Microscopic Urine, Clean Catch  Result Value Ref Range   Color, Urine YELLOW YELLOW   APPearance HAZY (A) CLEAR   Specific  Gravity, Urine 1.010 1.005 - 1.030   pH 6.0 5.0 - 8.0   Glucose, UA NEGATIVE NEGATIVE mg/dL   Hgb urine dipstick NEGATIVE NEGATIVE   Bilirubin Urine NEGATIVE NEGATIVE   Ketones, ur NEGATIVE NEGATIVE mg/dL   Protein, ur NEGATIVE NEGATIVE mg/dL   Nitrite POSITIVE (A) NEGATIVE   Leukocytes,Ua NEGATIVE NEGATIVE   Squamous Epithelial / LPF 0-5 0 - 5   WBC, UA 0-5 0 - 5 WBC/hpf   RBC / HPF 0-5 0 - 5 RBC/hpf   Bacteria, UA FEW (A) NONE SEEN   WBC Clumps PRESENT       Assessment & Plan:   Problem List Items Addressed This Visit   None Visit Diagnoses     Neck pain on left side    -  Primary   Strain of left trapezius muscle, initial encounter           Clinically localized MSK etiology of her symptoms currently No obvious injury or trauma No focal neuro deficit or other associated symptoms  Reassurance given today. Reviewed signs symptoms of CVA or other concerns  Trial with ROM exercise, topical OTC Voltaren PRN, Tylenol, would be cautious with NSAID oral  Follow up if need / as scheduled  Reviewed when to go to ED if indicated.  No orders of the defined types were placed in this encounter.     Follow up plan: Return if symptoms worsen or fail to improve, for keep apt next week.  Nobie Putnam, [facility] 06/15/2021, 1:21 PM

## 2021-06-15 NOTE — Patient Instructions (Signed)
° °  Please schedule a Follow-up Appointment to: No follow-ups on file. ° °If you have any other questions or concerns, please feel free to call the office or send a message through MyChart. You may also schedule an earlier appointment if necessary. ° °Additionally, you may be receiving a survey about your experience at our office within a few days to 1 week by e-mail or mail. We value your feedback. ° °Davetta Olliff, DO °[facility] °

## 2021-06-16 ENCOUNTER — Other Ambulatory Visit: Payer: Medicare HMO

## 2021-06-20 ENCOUNTER — Other Ambulatory Visit: Payer: Self-pay

## 2021-06-20 DIAGNOSIS — E7800 Pure hypercholesterolemia, unspecified: Secondary | ICD-10-CM

## 2021-06-20 DIAGNOSIS — I6930 Unspecified sequelae of cerebral infarction: Secondary | ICD-10-CM

## 2021-06-20 DIAGNOSIS — R7309 Other abnormal glucose: Secondary | ICD-10-CM

## 2021-06-20 DIAGNOSIS — E559 Vitamin D deficiency, unspecified: Secondary | ICD-10-CM | POA: Diagnosis not present

## 2021-06-20 DIAGNOSIS — E538 Deficiency of other specified B group vitamins: Secondary | ICD-10-CM | POA: Diagnosis not present

## 2021-06-20 DIAGNOSIS — Z0000 Encounter for general adult medical examination without abnormal findings: Secondary | ICD-10-CM | POA: Diagnosis not present

## 2021-06-20 DIAGNOSIS — N182 Chronic kidney disease, stage 2 (mild): Secondary | ICD-10-CM | POA: Diagnosis not present

## 2021-06-20 DIAGNOSIS — E669 Obesity, unspecified: Secondary | ICD-10-CM

## 2021-06-21 LAB — COMPLETE METABOLIC PANEL WITH GFR
AG Ratio: 1.8 (calc) (ref 1.0–2.5)
ALT: 16 U/L (ref 6–29)
AST: 14 U/L (ref 10–35)
Albumin: 3.9 g/dL (ref 3.6–5.1)
Alkaline phosphatase (APISO): 73 U/L (ref 37–153)
BUN: 16 mg/dL (ref 7–25)
CO2: 27 mmol/L (ref 20–32)
Calcium: 9.4 mg/dL (ref 8.6–10.4)
Chloride: 109 mmol/L (ref 98–110)
Creat: 1 mg/dL (ref 0.50–1.05)
Globulin: 2.2 g/dL (calc) (ref 1.9–3.7)
Glucose, Bld: 90 mg/dL (ref 65–99)
Potassium: 4.1 mmol/L (ref 3.5–5.3)
Sodium: 143 mmol/L (ref 135–146)
Total Bilirubin: 0.5 mg/dL (ref 0.2–1.2)
Total Protein: 6.1 g/dL (ref 6.1–8.1)
eGFR: 63 mL/min/{1.73_m2} (ref >=60)

## 2021-06-21 LAB — CBC WITH DIFFERENTIAL/PLATELET
Absolute Monocytes: 504 cells/uL (ref 200–950)
Basophils Absolute: 88 cells/uL (ref 0–200)
Basophils Relative: 1.1 %
Eosinophils Absolute: 416 cells/uL (ref 15–500)
Eosinophils Relative: 5.2 %
HCT: 40 % (ref 35.0–45.0)
Hemoglobin: 13 g/dL (ref 11.7–15.5)
Lymphs Abs: 2704 cells/uL (ref 850–3900)
MCH: 31.6 pg (ref 27.0–33.0)
MCHC: 32.5 g/dL (ref 32.0–36.0)
MCV: 97.1 fL (ref 80.0–100.0)
MPV: 9.6 fL (ref 7.5–12.5)
Monocytes Relative: 6.3 %
Neutro Abs: 4288 cells/uL (ref 1500–7800)
Neutrophils Relative %: 53.6 %
Platelets: 291 10*3/uL (ref 140–400)
RBC: 4.12 10*6/uL (ref 3.80–5.10)
RDW: 12.5 % (ref 11.0–15.0)
Total Lymphocyte: 33.8 %
WBC: 8 10*3/uL (ref 3.8–10.8)

## 2021-06-21 LAB — HEMOGLOBIN A1C
Hgb A1c MFr Bld: 5.1 % of total Hgb (ref <5.7)
Mean Plasma Glucose: 100 mg/dL
eAG (mmol/L): 5.5 mmol/L

## 2021-06-21 LAB — VITAMIN D 25 HYDROXY (VIT D DEFICIENCY, FRACTURES): Vit D, 25-Hydroxy: 50 ng/mL (ref 30–100)

## 2021-06-21 LAB — VITAMIN B12: Vitamin B-12: 694 pg/mL (ref 200–1100)

## 2021-06-21 LAB — TSH: TSH: 2.13 mIU/L (ref 0.40–4.50)

## 2021-06-21 LAB — LIPID PANEL
Cholesterol: 150 mg/dL (ref <200)
HDL: 65 mg/dL (ref >=50)
LDL Cholesterol (Calc): 66 mg/dL (calc)
Non-HDL Cholesterol (Calc): 85 mg/dL (calc) (ref <130)
Total CHOL/HDL Ratio: 2.3 (calc) (ref <5.0)
Triglycerides: 107 mg/dL (ref <150)

## 2021-06-23 ENCOUNTER — Encounter: Payer: Self-pay | Admitting: Family Medicine

## 2021-06-23 ENCOUNTER — Ambulatory Visit (INDEPENDENT_AMBULATORY_CARE_PROVIDER_SITE_OTHER): Payer: Medicare HMO | Admitting: Family Medicine

## 2021-06-23 ENCOUNTER — Other Ambulatory Visit: Payer: Self-pay

## 2021-06-23 VITALS — BP 95/64 | HR 102 | Ht 62.0 in | Wt 195.0 lb

## 2021-06-23 DIAGNOSIS — Z0000 Encounter for general adult medical examination without abnormal findings: Secondary | ICD-10-CM | POA: Diagnosis not present

## 2021-06-23 DIAGNOSIS — Z23 Encounter for immunization: Secondary | ICD-10-CM

## 2021-06-23 MED ORDER — SHINGRIX 50 MCG/0.5ML IM SUSR: INTRAMUSCULAR | 1 refills | Status: DC

## 2021-06-23 NOTE — Progress Notes (Signed)
Subjective:    Patient ID: [name], female    DOB: Mar 19, 1956, 65 y.o.   MRN: [id number]  [name] is a 65 y.o. female presenting on 06/23/2021 for Annual Exam   HPI  Here for Annual Physical and Lab Review.  Lab review today. All results within normal range. No significant concerns. She is doing well with lifestyle diet Cholesterol is in range Vitamin testing is normal on lab Hemoglobin A1c 5.1 is in normal range.  Right Knee swelling Chronic episodic problem R knee gradual inc swelling surrounding knee joint, no new injury has had problem with this knee in past with prior arthroscopic procedure. Currently not bothering her much but feels more swollen.  Anxiety Swimming 1 mile when exercising, helping her mind. But does not resolve the anxiety. - She has some anxiety and stress associated with talking with her father at home, husband and father don't get along, and she is looking for help to talk with someone to resolve some anxiety related to this.  Health Maintenance: Due for Flu Shot, will receive today   Future Shingrix vaccine at pharmacy if when ready   Depression screen [facility] 2/9 06/23/2021 04/19/2021 03/08/2021  Decreased Interest 1 0 2  Down, Depressed, Hopeless 1 2 2   PHQ - 2 Score 2 2 4   Altered sleeping 1 2 1   Tired, decreased energy 0 2 1  Change in appetite 0 0 1  Feeling bad or failure about yourself  1 1 1   Trouble concentrating 0 2 2  Moving slowly or fidgety/restless 0 0 1  Suicidal thoughts 0 0 0  PHQ-9 Score 4 9 11   Difficult doing work/chores Not difficult at all Somewhat difficult Somewhat difficult    Past Medical History:  Diagnosis Date   Allergy    Anxiety    Arthritis    left hip   Blood transfusion without reported diagnosis    with hip surgery    GERD (gastroesophageal reflux disease)    Hepatitis 1972   hep B   Hyperlipidemia    controlled on meds    Memory disorder 09/21/2017   OSA (obstructive sleep  apnea) 05/08/2018   PONV (postoperative nausea and vomiting)    Seizures ([place])    last seizure 11-2016   Sleep apnea    no cpap    Stroke ([place])    per pt    Past Surgical History:  Procedure Laterality Date   ABDOMINAL HYSTERECTOMY  04/1999   ARTHROSCOPIC REPAIR ACL     bilateral knees   BREAST EXCISIONAL BIOPSY Right 2008   NEG   BREAST EXCISIONAL BIOPSY Left YRS AGO   X2 - NEG   BREAST LUMPECTOMY  06/1979; 03/1995   left   CHOLECYSTECTOMY  09/1987   COLONOSCOPY     LAPAROSCOPIC GASTRIC SLEEVE RESECTION  09/30/2012   Procedure: LAPAROSCOPIC GASTRIC SLEEVE RESECTION;  Surgeon: Madilyn Hook, DO;  Location: [facility];  Service: General;  Laterality: N/A;   POLYPECTOMY     TOTAL HIP ARTHROPLASTY  11/13/2012   left   UPPER GASTROINTESTINAL ENDOSCOPY     UPPER GI ENDOSCOPY  09/30/2012   Procedure: UPPER GI ENDOSCOPY;  Surgeon: Madilyn Hook, DO;  Location: [facility];  Service: General;;   Social History   Socioeconomic History   Marital status: Married    Spouse name: Not on file   Number of children: 0   Years of education: BS   Highest education level: Not on file  Occupational  History   Not on file  Tobacco Use   Smoking status: Former    Packs/day: 0.25    Years: 5.00    Pack years: 1.25    Types: Cigarettes    Quit date: 07/25/2001    Years since quitting: 19.9   Smokeless tobacco: Never  Vaping Use   Vaping Use: Never used  Substance and Sexual Activity   Alcohol use: Yes    Alcohol/week: 0.0 standard drinks    Comment: 2 glasses of wine per week   Drug use: No   Sexual activity: Not on file  Other Topics Concern   Not on file  Social History Narrative   Lives with spouse   Caffeine use: Drinks coffee daily   Right handed   Social Determinants of Health   Financial Resource Strain: Low Risk    Difficulty of Paying Living Expenses: Not hard at all  Food Insecurity: No Food Insecurity   Worried About Charity fundraiser in the Last Year: Never true   Ran Out of  Food in the Last Year: Never true  Transportation Needs: No Transportation Needs   Lack of Transportation (Medical): No   Lack of Transportation (Non-Medical): No  Physical Activity: Inactive   Days of Exercise per Week: 0 days   Minutes of Exercise per Session: 0 min  Stress: No Stress Concern Present   Feeling of Stress : Only a little  Social Connections: Not on file  Intimate Partner Violence: Not on file   Family History  Problem Relation Age of Onset   Hyperlipidemia Mother    Obesity Mother    Pulmonary fibrosis Mother    Cancer Maternal Aunt        ovarian cancer   Cancer Father    Prostate cancer Father    Colon cancer Paternal Grandmother    Esophageal cancer Neg Hx    Stomach cancer Neg Hx    Rectal cancer Neg Hx    Breast cancer Neg Hx    Colon polyps Neg Hx    Current Outpatient Medications on File Prior to Visit  Medication Sig   acetaminophen (TYLENOL) 500 MG tablet Take 2 tablets (1,000 mg total) by mouth every 6 (six) hours as needed for mild pain or moderate pain.   B Complex Vitamins (VITAMIN-B COMPLEX PO) Take by mouth daily.   lamoTRIgine (LAMICTAL) 100 MG tablet Take 1 tablet (100 mg total) by mouth 2 (two) times daily.   Melatonin 3 MG TABS Take 6 mg by mouth at bedtime.    Multiple Vitamin (MULTIVITAMIN WITH MINERALS) TABS Take 1 tablet by mouth daily.    Omega-3 Fatty Acids (FISH OIL) 1200 MG CAPS Take 1 capsule by mouth 2 (two) times daily.   rosuvastatin (CRESTOR) 10 MG tablet TAKE 1 TABLET (10 MG TOTAL) BY MOUTH AT BEDTIME.   venlafaxine XR (EFFEXOR-XR) 150 MG 24 hr capsule Take 1 capsule (150 mg total) by mouth daily with breakfast.   No current facility-administered medications on file prior to visit.    Review of Systems  Constitutional:  Negative for activity change, appetite change, chills, diaphoresis, fatigue and fever.  HENT:  Negative for congestion and hearing loss.   Eyes:  Negative for visual disturbance.  Respiratory:  Negative  for cough, chest tightness, shortness of breath and wheezing.   Cardiovascular:  Negative for chest pain, palpitations and leg swelling.  Gastrointestinal:  Negative for abdominal pain, constipation, diarrhea, nausea and vomiting.  Genitourinary:  Negative for dysuria,  frequency and hematuria.  Musculoskeletal:  Positive for joint swelling. Negative for arthralgias and neck pain.  Skin:  Negative for rash.  Allergic/Immunologic: Negative for environmental allergies.  Neurological:  Negative for dizziness, weakness, light-headedness, numbness and headaches.  Hematological:  Negative for adenopathy.  Psychiatric/Behavioral:  Negative for behavioral problems, dysphoric mood and sleep disturbance.   Per HPI unless specifically indicated above     Objective:    BP 95/64   Pulse (!) 102   Ht 5' 2"  (1.575 m)   Wt 195 lb (88.5 kg)   SpO2 97%   BMI 35.67 kg/m   Wt Readings from Last 3 Encounters:  06/23/21 195 lb (88.5 kg)  06/15/21 198 lb (89.8 kg)  04/28/21 198 lb (89.8 kg)    Physical Exam Vitals and nursing note reviewed.  Constitutional:      General: She is not in acute distress.    Appearance: She is well-developed. She is not diaphoretic.     Comments: Well-appearing, comfortable, cooperative  HENT:     Head: Normocephalic and atraumatic.  Eyes:     General:        Right eye: No discharge.        Left eye: No discharge.     Conjunctiva/sclera: Conjunctivae normal.     Pupils: Pupils are equal, round, and reactive to light.  Neck:     Thyroid: No thyromegaly.  Cardiovascular:     Rate and Rhythm: Normal rate and regular rhythm.     Pulses: Normal pulses.     Heart sounds: Normal heart sounds. No murmur heard. Pulmonary:     Effort: Pulmonary effort is normal. No respiratory distress.     Breath sounds: Normal breath sounds. No wheezing or rales.  Abdominal:     General: Bowel sounds are normal. There is no distension.     Palpations: Abdomen is soft. There is no  mass.     Tenderness: There is no abdominal tenderness.  Musculoskeletal:        General: No tenderness. Normal range of motion.     Cervical back: Normal range of motion and neck supple.     Right lower leg: No edema.     Left lower leg: No edema.     Comments: Upper / Lower Extremities: - Normal muscle tone, strength bilateral upper extremities 5/5, lower extremities 5/5  Right Knee some mild crepitus. Mild increased soft tissue swelling surrounding knee, limited sign of effusion, no erythema, non tender.  Lymphadenopathy:     Cervical: No cervical adenopathy.  Skin:    General: Skin is warm and dry.     Findings: No erythema or rash.  Neurological:     Mental Status: She is alert and oriented to person, place, and time.     Comments: Distal sensation intact to light touch all extremities  Psychiatric:        Mood and Affect: Mood normal.        Behavior: Behavior normal.        Thought Content: Thought content normal.     Comments: Well groomed, good eye contact, normal speech and thoughts    I have personally reviewed the radiology report from 04/21/15 R knee x-ray.  CLINICAL DATA:  Slipped and fell on right knee 10 days ago with worsening pain and swelling   EXAM: RIGHT KNEE - COMPLETE 4+ VIEW   COMPARISON:  None.   FINDINGS: There is primarily bicompartmental degenerative joint disease of the right knee involving the  medial and patellofemoral compartments with loss of joint space and spurring. No acute fracture is seen. Alignment is normal. No joint effusion is noted. There is some soft tissue swelling anteriorly and inferiorly over the knee.   IMPRESSION: 1. Bicompartmental degenerative joint disease. No acute abnormality. No joint effusion. 2. Soft tissue swelling over the anterior inferior knee.     Electronically Signed   By: Ivar Drape M.D.   On: 04/21/2015 13:33   Results for orders placed or performed in visit on 06/20/21  Vitamin B12  Result Value  Ref Range   Vitamin B-12 694 200 - 1,100 pg/mL  VITAMIN D 25 Hydroxy (Vit-D Deficiency, Fractures)  Result Value Ref Range   Vit D, 25-Hydroxy 50 30 - 100 ng/mL  TSH  Result Value Ref Range   TSH 2.13 0.40 - 4.50 mIU/L  Lipid panel  Result Value Ref Range   Cholesterol 150 <200 mg/dL   HDL 65 > OR = 50 mg/dL   Triglycerides 107 <150 mg/dL   LDL Cholesterol (Calc) 66 mg/dL (calc)   Total CHOL/HDL Ratio 2.3 <5.0 (calc)   Non-HDL Cholesterol (Calc) 85 <130 mg/dL (calc)  COMPLETE METABOLIC PANEL WITH GFR  Result Value Ref Range   Glucose, Bld 90 65 - 99 mg/dL   BUN 16 7 - 25 mg/dL   Creat 1.00 0.50 - 1.05 mg/dL   eGFR 63 > OR = 60 mL/min/1.24m   BUN/Creatinine Ratio NOT APPLICABLE 6 - 22 (calc)   Sodium 143 135 - 146 mmol/L   Potassium 4.1 3.5 - 5.3 mmol/L   Chloride 109 98 - 110 mmol/L   CO2 27 20 - 32 mmol/L   Calcium 9.4 8.6 - 10.4 mg/dL   Total Protein 6.1 6.1 - 8.1 g/dL   Albumin 3.9 3.6 - 5.1 g/dL   Globulin 2.2 1.9 - 3.7 g/dL (calc)   AG Ratio 1.8 1.0 - 2.5 (calc)   Total Bilirubin 0.5 0.2 - 1.2 mg/dL   Alkaline phosphatase (APISO) 73 37 - 153 U/L   AST 14 10 - 35 U/L   ALT 16 6 - 29 U/L  CBC with Differential/Platelet  Result Value Ref Range   WBC 8.0 3.8 - 10.8 Thousand/uL   RBC 4.12 3.80 - 5.10 Million/uL   Hemoglobin 13.0 11.7 - 15.5 g/dL   HCT 40.0 35.0 - 45.0 %   MCV 97.1 80.0 - 100.0 fL   MCH 31.6 27.0 - 33.0 pg   MCHC 32.5 32.0 - 36.0 g/dL   RDW 12.5 11.0 - 15.0 %   Platelets 291 140 - 400 Thousand/uL   MPV 9.6 7.5 - 12.5 fL   Neutro Abs 4,288 1,500 - 7,800 cells/uL   Lymphs Abs 2,704 850 - 3,900 cells/uL   Absolute Monocytes 504 200 - 950 cells/uL   Eosinophils Absolute 416 15 - 500 cells/uL   Basophils Absolute 88 0 - 200 cells/uL   Neutrophils Relative % 53.6 %   Total Lymphocyte 33.8 %   Monocytes Relative 6.3 %   Eosinophils Relative 5.2 %   Basophils Relative 1.1 %  Hemoglobin A1c  Result Value Ref Range   Hgb A1c MFr Bld 5.1 <5.7 % of  total Hgb   Mean Plasma Glucose 100 mg/dL   eAG (mmol/L) 5.5 mmol/L      Assessment & Plan:   Problem List Items Addressed This Visit   None Visit Diagnoses     Annual physical exam    -  Primary   Needs  flu shot       Relevant Orders   Flu Vaccine QUAD High Dose(Fluad) (Completed)   Need for shingles vaccine       Relevant Medications   SHINGRIX injection       Updated Health Maintenance information Vaccines updated Reviewed recent lab results with patient Encouraged improvement to lifestyle with diet and exercise Goal of weight loss  Handout today on therapy options for CBT for stressors/anxiety, she can contact or notify us for referral  Knee appears stable, would use topical Voltaren OTC options, compression, RICE therapy modify activity, f/u in future if need.   Meds ordered this encounter  Medications   SHINGRIX injection    Sig: Inject 0.5 mL into muscle for shingles vaccine. Repeat dose in 2-6 months.    Dispense:  0.5 mL    Refill:  1     Follow up plan: Return in about 6 months (around 12/21/2021) for 6 follow-up Anxiety, Mood updates.  Nobie Putnam, [facility] 06/23/2021, 9:57 AM

## 2021-06-23 NOTE — Patient Instructions (Addendum)
Thank you for coming to the office today.  Future Prevnar 42 - next year age 65, then 1 year later 2nd dose of Pneumovax23 and done.  Please schedule and return for a NURSE ONLY VISIT for VACCINE - Approximately 1-2 weeks later - Need  Shingrix vaccine 2 doses, 2-6 months apart  These offices have both [place] and [place] [employer]omatic [employer] operational officer Available) [place] [address] [place], [place] [address] Phone: [phone]  [facility] Address: [address], [place], [place] [address] [email or web address] Phone: [phone]  [place] ([place] [facility]) Address: [place], [place], [place] [address] Hours: 8:30AM-5PM Phone: [phone]  [place] [facility] [place], [place] [address] Phone: [phone]  [facility] (All [facility]) [address], [place], [id number] Phone: [phone] (Option 1) [email or web address]  ----------------------------------------------------------------- THERAPIST ONLY  (No Psychiatry)  [facility] [address], [place] [address] Johnnette Litter P: [phone]  [place], [address], [place] [address] [place]: [place].   Address: [place], [place], [place] [address] Hours: Open today  9AM-7PM Phone: [phone]  [facility] [address], [place] Address: [address] [place], [place],  [address] Phone: [phone]    Please schedule a Follow-up Appointment to: Return in about 6 months (around 12/21/2021) for 6 follow-up Anxiety, Mood updates.  If you have any other questions or concerns, please feel free to call the office or send a message through [place]. You may also schedule an earlier appointment if  necessary.  Additionally, you may be receiving a survey about your experience at our office within a few days to 1 week by e-mail or mail. We value your feedback.  Nobie Putnam, DO [place]

## 2021-06-24 ENCOUNTER — Telehealth: Payer: Self-pay | Admitting: Family Medicine

## 2021-06-24 DIAGNOSIS — Z1231 Encounter for screening mammogram for malignant neoplasm of breast: Secondary | ICD-10-CM

## 2021-06-24 NOTE — Telephone Encounter (Signed)
Copied from [place] [phone]. Topic: Referral - Request for Referral >> Jun 24, 2021  9:10 AM Leward Quan A wrote: Has patient seen PCP for this complaint? Yes.   *If NO, is insurance requiring patient see PCP for this issue before PCP can refer them? Referral for which specialty: Radiologist Preferred provider/office: [facility] Reason for referral: Mammogram

## 2021-06-27 ENCOUNTER — Other Ambulatory Visit: Payer: Self-pay | Admitting: Family Medicine

## 2021-06-27 DIAGNOSIS — R921 Mammographic calcification found on diagnostic imaging of breast: Secondary | ICD-10-CM

## 2021-06-27 DIAGNOSIS — R928 Other abnormal and inconclusive findings on diagnostic imaging of breast: Secondary | ICD-10-CM

## 2021-06-27 NOTE — Telephone Encounter (Signed)
Ordered mammogram.  Patient may call to schedule apt at [facility] now.  Call the [place] below anytime to schedule your own appointment now that order has been placed.  [facility] [place], [place] [address] Phone: [phone]  Nobie Putnam, [facility] 06/27/2021, 10:26 AM

## 2021-08-16 ENCOUNTER — Ambulatory Visit
Admission: RE | Admit: 2021-08-16 | Discharge: 2021-08-16 | Disposition: A | Discharge status: Home / Self Care | Payer: Medicare HMO | Source: Ambulatory Visit | Attending: Family Medicine | Admitting: Family Medicine

## 2021-08-16 ENCOUNTER — Other Ambulatory Visit: Payer: Self-pay

## 2021-08-16 DIAGNOSIS — R922 Inconclusive mammogram: Secondary | ICD-10-CM | POA: Diagnosis not present

## 2021-08-16 DIAGNOSIS — R928 Other abnormal and inconclusive findings on diagnostic imaging of breast: Secondary | ICD-10-CM | POA: Diagnosis not present

## 2021-08-16 DIAGNOSIS — R921 Mammographic calcification found on diagnostic imaging of breast: Secondary | ICD-10-CM | POA: Diagnosis not present

## 2021-08-17 ENCOUNTER — Other Ambulatory Visit: Payer: Self-pay | Admitting: Family Medicine

## 2021-08-17 DIAGNOSIS — F3341 Major depressive disorder, recurrent, in partial remission: Secondary | ICD-10-CM

## 2021-08-17 NOTE — Telephone Encounter (Signed)
[place] called and spoke to [place] about the refill(s) Effexor 150mg  requested. Advised it was sent on 04/29/21 #90/1 refill(s). She says that medication was just filled and unsure why refill request sent but pt has refills remaining until 10/2021. Will refuse this request.  Requested Prescriptions  Pending Prescriptions Disp Refills   venlafaxine XR (EFFEXOR-XR) 150 MG 24 hr capsule [Pharmacy Med Name: VENLAFAXINE HCL ER 150 MG Capsule Extended Release 24 Hour] 90 capsule 1    Sig: TAKE 1 CAPSULE (150 MG TOTAL) BY MOUTH DAILY WITH BREAKFAST. DOSE CHANGE FROM 75MG  UP TO 150MG .     Psychiatry: Antidepressants - SNRI - desvenlafaxine & venlafaxine Passed - 08/17/2021 10:36 AM      Passed - LDL in normal range and within 360 days    LDL Cholesterol (Calc)  Date Value Ref Range Status  06/20/2021 66 mg/dL (calc) Final    Comment:    Reference range: <100 . Desirable range <100 mg/dL for primary prevention;   <70 mg/dL for patients with CHD or diabetic patients  with > or = 2 CHD risk factors. Marland Kitchen LDL-C is now calculated using the Martin-Hopkins  calculation, which is a validated novel method providing  better accuracy than the Friedewald equation in the  estimation of LDL-C.  Cresenciano Genre et al. Annamaria Helling. 8768;115(72): 2061-2068  (http://education.[employer].com/faq/FAQ164)    Direct LDL  Date Value Ref Range Status  12/19/2012 146.6 mg/dL Final    Comment:    Optimal:  <100 mg/dLNear or Above Optimal:  100-129 mg/dLBorderline High:  130-159 mg/dLHigh:  160-189 mg/dLVery High:  >190 mg/dL          Passed - Total Cholesterol in normal range and within 360 days    Cholesterol  Date Value Ref Range Status  06/20/2021 150 <200 mg/dL Final          Passed - Triglycerides in normal range and within 360 days    Triglycerides  Date Value Ref Range Status  06/20/2021 107 <150 mg/dL Final          Passed - Completed PHQ-2 or PHQ-9 in the last 360 days      Passed -  Last BP in normal range    BP Readings from Last 1 Encounters:  06/23/21 95/64          Passed - Valid encounter within last 6 months    Recent Outpatient Visits           1 month ago Annual physical exam   [place], DO   2 months ago Neck pain on left side   [place], DO   4 months ago Moderate episode of recurrent major depressive disorder [facility])   [place], DO   5 months ago Moderate episode of recurrent major depressive disorder [facility])   [place], DO   11 months ago Urinary tract infection with hematuria, site unspecified   [place], DO       Future Appointments             In 4 months [place], Devonne Doughty, DO [facility] [place]   In 8 months  [facility], [facility]

## 2021-08-18 ENCOUNTER — Other Ambulatory Visit: Payer: Self-pay | Admitting: Family Medicine

## 2021-08-18 DIAGNOSIS — F3341 Major depressive disorder, recurrent, in partial remission: Secondary | ICD-10-CM

## 2021-08-18 NOTE — Telephone Encounter (Unsigned)
Copied from [place] [id number]. Topic: Quick Communication - Rx Refill/Question >> Aug 18, 2021 10:43 AM Tessa Lerner A wrote: Medication: venlafaxine XR (EFFEXOR-XR) 150 MG 24 hr capsule [[id number]]   Has the patient contacted their pharmacy? Yes.  The patient has been directed to contact their PCP (Agent: If no, request that the patient contact the pharmacy for the refill. If patient does not wish to contact the pharmacy document the reason why and proceed with request.) (Agent: If yes, when and what did the pharmacy advise?)  Preferred Pharmacy (with phone number or street name): [facility], [place] [address] Phone: [phone] Fax: [phone]  Has the patient been seen for an appointment in the last year OR does the patient have an upcoming appointment? Yes.    Agent: Please be advised that RX refills may take up to 3 business days. We ask that you follow-up with your pharmacy.

## 2021-08-19 ENCOUNTER — Other Ambulatory Visit: Payer: Self-pay | Admitting: Neurology

## 2021-08-26 ENCOUNTER — Other Ambulatory Visit: Payer: Self-pay | Admitting: Family Medicine

## 2021-08-26 DIAGNOSIS — F3341 Major depressive disorder, recurrent, in partial remission: Secondary | ICD-10-CM

## 2021-08-27 NOTE — Telephone Encounter (Signed)
Pharmacy closed on WE- will call on Monday

## 2021-08-28 NOTE — Telephone Encounter (Signed)
Called pt and LM on VM to call back with information on amount of med she has left and if she needs the med now.

## 2021-08-29 NOTE — Telephone Encounter (Signed)
Dose changed from 75 mg to 150 mg . Protocol met.  Requested Prescriptions  Pending Prescriptions Disp Refills  . venlafaxine XR (EFFEXOR-XR) 150 MG 24 hr capsule [Pharmacy Med Name: VENLAFAXINE HCL ER 150 MG Capsule Extended Release 24 Hour] 90 capsule 1    Sig: TAKE 1 CAPSULE (150 MG TOTAL) BY MOUTH DAILY WITH BREAKFAST. DOSE CHANGE FROM 75MG UP TO 150MG.     Psychiatry: Antidepressants - SNRI - desvenlafaxine & venlafaxine Passed - 08/26/2021  9:58 PM      Passed - LDL in normal range and within 360 days    LDL Cholesterol (Calc)  Date Value Ref Range Status  06/20/2021 66 mg/dL (calc) Final    Comment:    Reference range: <100 . Desirable range <100 mg/dL for primary prevention;   <70 mg/dL for patients with CHD or diabetic patients  with > or = 2 CHD risk factors. Marland Kitchen LDL-C is now calculated using the Martin-Hopkins  calculation, which is a validated novel method providing  better accuracy than the Friedewald equation in the  estimation of LDL-C.  Cresenciano Genre et al. Annamaria Helling. 7544;920(10): 2061-2068  (http://education.[facility].com/faq/FAQ164)    Direct LDL  Date Value Ref Range Status  12/19/2012 146.6 mg/dL Final    Comment:    Optimal:  <100 mg/dLNear or Above Optimal:  100-129 mg/dLBorderline High:  130-159 mg/dLHigh:  160-189 mg/dLVery High:  >190 mg/dL         Passed - Total Cholesterol in normal range and within 360 days    Cholesterol  Date Value Ref Range Status  06/20/2021 150 <200 mg/dL Final         Passed - Triglycerides in normal range and within 360 days    Triglycerides  Date Value Ref Range Status  06/20/2021 107 <150 mg/dL Final         Passed - Completed PHQ-2 or PHQ-9 in the last 360 days      Passed - Last BP in normal range    BP Readings from Last 1 Encounters:  06/23/21 95/64         Passed - Valid encounter within last 6 months    Recent Outpatient Visits          2 months ago Annual physical exam   [place], DO   2 months ago Neck pain on left side   [place], DO   4 months ago Moderate episode of recurrent major depressive disorder [facility])   [place], DO   5 months ago Moderate episode of recurrent major depressive disorder [facility])   [place], DO   12 months ago Urinary tract infection with hematuria, site unspecified   [place], DO      Future Appointments            In 3 months Parks Ranger, Devonne Doughty, DO [facility], [place]   In 8 months  [facility], [facility]

## 2021-09-12 ENCOUNTER — Other Ambulatory Visit: Payer: Self-pay | Admitting: Family Medicine

## 2021-09-12 DIAGNOSIS — E7800 Pure hypercholesterolemia, unspecified: Secondary | ICD-10-CM

## 2021-09-12 NOTE — Telephone Encounter (Signed)
Requested Prescriptions  Pending Prescriptions Disp Refills  . rosuvastatin (CRESTOR) 10 MG tablet [Pharmacy Med Name: ROSUVASTATIN CALCIUM 10 MG Tablet] 90 tablet 1    Sig: TAKE 1 TABLET (10 MG TOTAL) BY MOUTH AT BEDTIME.     Cardiovascular:  Antilipid - Statins Passed - 09/12/2021  3:33 AM      Passed - Total Cholesterol in normal range and within 360 days    Cholesterol  Date Value Ref Range Status  06/20/2021 150 <200 mg/dL Final         Passed - LDL in normal range and within 360 days    LDL Cholesterol (Calc)  Date Value Ref Range Status  06/20/2021 66 mg/dL (calc) Final    Comment:    Reference range: <100 . Desirable range <100 mg/dL for primary prevention;   <70 mg/dL for patients with CHD or diabetic patients  with > or = 2 CHD risk factors. Marland Kitchen LDL-C is now calculated using the Martin-Hopkins  calculation, which is a validated novel method providing  better accuracy than the Friedewald equation in the  estimation of LDL-C.  Cresenciano Genre et al. Annamaria Helling. 3545;625(63): 2061-2068  (http://education.[facility].com/faq/FAQ164)    Direct LDL  Date Value Ref Range Status  12/19/2012 146.6 mg/dL Final    Comment:    Optimal:  <100 mg/dLNear or Above Optimal:  100-129 mg/dLBorderline High:  130-159 mg/dLHigh:  160-189 mg/dLVery High:  >190 mg/dL         Passed - HDL in normal range and within 360 days    HDL  Date Value Ref Range Status  06/20/2021 65 > OR = 50 mg/dL Final         Passed - Triglycerides in normal range and within 360 days    Triglycerides  Date Value Ref Range Status  06/20/2021 107 <150 mg/dL Final         Passed - Patient is not pregnant      Passed - Valid encounter within last 12 months    Recent Outpatient Visits          2 months ago Annual physical exam   [place], DO   2 months ago Neck pain on left side   [place], DO   5 months ago Moderate  episode of recurrent major depressive disorder [facility])   [facility] O[place] Hauser, DO   6 months ago Moderate episode of recurrent major depressive disorder [facility])   [facility] Olin Hauser, DO   1 year ago Urinary tract infection with hematuria, site unspecified   [place], DO      Future Appointments            In 3 months Parks Ranger, Devonne Doughty, DO [facility], [place]   In 7 months  [facility], [facility]

## 2021-10-05 NOTE — Progress Notes (Signed)
PATIENT: [name] DOB: 1956/02/22  REASON FOR VISIT: Follow up HISTORY FROM: Patient PRIMARY NEUROLOGIST: Dr. April Manson now that Dr. Jannifer Franklin is retired  HISTORY OF PRESENT ILLNESS: Today 10/06/21 [name] here today for follow-up with history of subarachnoid hemorrhage associated with left frontal stroke in 2018, began having seizures 1 year later.  Has been treated with Keppra, reported side effect of drowsiness. Tapered off Keppra and switched to Lamictal 100 mg BID back in August. Doing much better, a few times some dizziness, but rests and goes away. Previous seizures before have been total loss of consciousness. Getting around well. Has lost about 15 lbs. Here today alone.   HISTORY 04/28/2021 Dr. Jannifer Franklin: [name] is a 65 year old right-handed white female with a history of a subarachnoid hemorrhage associated with a left frontal stroke in February 2018.  Within a year after the event, she began having seizures.  She has sleep apnea as well but is not on CPAP.  She has lost about 50 pounds of weight and she no longer snores at night.  She is on Keppra, but even on the long-acting preparation she is reporting issues with drowsiness and cognitive slowing.  The patient has recently switched off of Prozac and has been switched to Effexor, she is being treated for depression.  She has not had any further seizures, she is operating a motor vehicle well.  She wishes to come off of the [place].  She does report some mild gait instability at times, she has not had any falls   REVIEW OF SYSTEMS: Out of a complete 14 system review of symptoms, the patient complains only of the following symptoms, and all other reviewed systems are negative.  See HPI  ALLERGIES: No Known Allergies  HOME MEDICATIONS: Outpatient Medications Prior to Visit  Medication Sig Dispense Refill   acetaminophen (TYLENOL) 500 MG tablet Take 2 tablets (1,000 mg total) by mouth every 6 (six) hours as needed for  mild pain or moderate pain. 30 tablet 0   B Complex Vitamins (VITAMIN-B COMPLEX PO) Take by mouth daily.     lamoTRIgine (LAMICTAL) 100 MG tablet TAKE 1 TABLET (100 MG TOTAL) BY MOUTH 2 (TWO) TIMES DAILY. 180 tablet 0   Melatonin 3 MG TABS Take 6 mg by mouth at bedtime.      Multiple Vitamin (MULTIVITAMIN WITH MINERALS) TABS Take 1 tablet by mouth daily.      Omega-3 Fatty Acids (FISH OIL) 1200 MG CAPS Take 1 capsule by mouth daily.     rosuvastatin (CRESTOR) 10 MG tablet TAKE 1 TABLET (10 MG TOTAL) BY MOUTH AT BEDTIME. 90 tablet 1   SHINGRIX injection Inject 0.5 mL into muscle for shingles vaccine. Repeat dose in 2-6 months. 0.5 mL 1   venlafaxine XR (EFFEXOR-XR) 150 MG 24 hr capsule TAKE 1 CAPSULE (150 MG TOTAL) BY MOUTH DAILY WITH BREAKFAST. DOSE CHANGE FROM 75MG  UP TO 150MG . (Patient taking differently: 150 mg daily with breakfast.) 90 capsule 1   No facility-administered medications prior to visit.    PAST MEDICAL HISTORY: Past Medical History:  Diagnosis Date   Allergy    Anxiety    Arthritis    left hip   Blood transfusion without reported diagnosis    with hip surgery    GERD (gastroesophageal reflux disease)    Hepatitis 1972   hep B   Hyperlipidemia    controlled on meds    Memory disorder 09/21/2017   OSA (obstructive sleep apnea) 05/08/2018  PONV (postoperative nausea and vomiting)    Seizures ([place])    last seizure 11-2016   Sleep apnea    no cpap    Stroke ([place])    per pt     PAST SURGICAL HISTORY: Past Surgical History:  Procedure Laterality Date   ABDOMINAL HYSTERECTOMY  04/1999   ARTHROSCOPIC REPAIR ACL     bilateral knees   BREAST EXCISIONAL BIOPSY Right 2008   NEG   BREAST EXCISIONAL BIOPSY Left YRS AGO   X2 - NEG   BREAST LUMPECTOMY  06/1979; 03/1995   left   CHOLECYSTECTOMY  09/1987   COLONOSCOPY     LAPAROSCOPIC GASTRIC SLEEVE RESECTION  09/30/2012   Procedure: LAPAROSCOPIC GASTRIC SLEEVE RESECTION;  Surgeon: Madilyn Hook, DO;  Location: [facility];   Service: General;  Laterality: N/A;   POLYPECTOMY     TOTAL HIP ARTHROPLASTY  11/13/2012   left   UPPER GASTROINTESTINAL ENDOSCOPY     UPPER GI ENDOSCOPY  09/30/2012   Procedure: UPPER GI ENDOSCOPY;  Surgeon: Madilyn Hook, DO;  Location: [facility];  Service: General;;    FAMILY HISTORY: Family History  Problem Relation Age of Onset   Hyperlipidemia Mother    Obesity Mother    Pulmonary fibrosis Mother    Cancer Maternal Aunt        ovarian cancer   Cancer Father    Prostate cancer Father    Colon cancer Paternal Grandmother    Esophageal cancer Neg Hx    Stomach cancer Neg Hx    Rectal cancer Neg Hx    Breast cancer Neg Hx    Colon polyps Neg Hx     SOCIAL HISTORY: Social History   Socioeconomic History   Marital status: Married    Spouse name: Not on file   Number of children: 0   Years of education: BS   Highest education level: Not on file  Occupational History   Not on file  Tobacco Use   Smoking status: Former    Packs/day: 0.25    Years: 5.00    Pack years: 1.25    Types: Cigarettes    Quit date: 07/25/2001    Years since quitting: 20.2   Smokeless tobacco: Never  Vaping Use   Vaping Use: Never used  Substance and Sexual Activity   Alcohol use: Yes    Alcohol/week: 0.0 standard drinks    Comment: 2 glasses of wine per week   Drug use: No   Sexual activity: Not on file  Other Topics Concern   Not on file  Social History Narrative   Lives with spouse   Caffeine use: Drinks coffee daily   Right handed   Social Determinants of Health   Financial Resource Strain: Low Risk    Difficulty of Paying Living Expenses: Not hard at all  Food Insecurity: No Food Insecurity   Worried About Charity fundraiser in the Last Year: Never true   Ran Out of Food in the Last Year: Never true  Transportation Needs: No Transportation Needs   Lack of Transportation (Medical): No   Lack of Transportation (Non-Medical): No  Physical Activity: Inactive   Days of Exercise  per Week: 0 days   Minutes of Exercise per Session: 0 min  Stress: No Stress Concern Present   Feeling of Stress : Only a little  Social Connections: Not on file  Intimate Partner Violence: Not on file   PHYSICAL EXAM  Vitals:   10/06/21 1011  BP: 109/77  Pulse: 80  Weight: 184 lb (83.5 kg)  Height: 5\' 2"  (1.575 m)   Body mass index is 33.65 kg/m.  Generalized: Well developed, in no acute distress   Neurological examination  Mentation: Alert oriented to time, place, history taking. Follows all commands speech and language fluent Cranial nerve II-XII: Pupils were equal round reactive to light. Extraocular movements were full, visual field were full on confrontational test. Facial sensation and strength were normal. Head turning and shoulder shrug  were normal and symmetric. Motor: The motor testing reveals 5 over 5 strength of all 4 extremities. Good symmetric motor tone is noted throughout.  Sensory: Sensory testing is intact to soft touch on all 4 extremities. No evidence of extinction is noted.  Coordination: Cerebellar testing reveals good finger-nose-finger and heel-to-shin bilaterally.  Gait and station: Gait is normal.  Reflexes: Deep tendon reflexes are symmetric and normal bilaterally.   DIAGNOSTIC DATA (LABS, IMAGING, TESTING) - I reviewed patient records, labs, notes, testing and imaging myself where available.  Lab Results  Component Value Date   WBC 8.0 06/20/2021   HGB 13.0 06/20/2021   HCT 40.0 06/20/2021   MCV 97.1 06/20/2021   PLT 291 06/20/2021      Component Value Date/Time   NA 143 06/20/2021 0915   NA 140 11/18/2012 0530   K 4.1 06/20/2021 0915   K 4.0 11/18/2012 0530   CL 109 06/20/2021 0915   CL 108 (H) 11/18/2012 0530   CO2 27 06/20/2021 0915   CO2 27 11/18/2012 0530   GLUCOSE 90 06/20/2021 0915   GLUCOSE 95 11/18/2012 0530   BUN 16 06/20/2021 0915   BUN 8 11/18/2012 0530   CREATININE 1.00 06/20/2021 0915   CALCIUM 9.4 06/20/2021 0915    CALCIUM 7.9 (L) 11/18/2012 0530   PROT 6.1 06/20/2021 0915   ALBUMIN 3.7 01/08/2017 1147   AST 14 06/20/2021 0915   ALT 16 06/20/2021 0915   ALKPHOS 88 01/08/2017 1147   BILITOT 0.5 06/20/2021 0915   GFRNONAA 60 06/16/2020 0944   GFRAA 69 06/16/2020 0944   Lab Results  Component Value Date   CHOL 150 06/20/2021   HDL 65 06/20/2021   LDLCALC 66 06/20/2021   LDLDIRECT 146.6 12/19/2012   TRIG 107 06/20/2021   CHOLHDL 2.3 06/20/2021   Lab Results  Component Value Date   HGBA1C 5.1 06/20/2021   Lab Results  Component Value Date   VITAMINB12 694 06/20/2021   Lab Results  Component Value Date   TSH 2.13 06/20/2021   ASSESSMENT AND PLAN 65 y.o. year old female  has a past medical history of Allergy, Anxiety, Arthritis, Blood transfusion without reported diagnosis, GERD (gastroesophageal reflux disease), Hepatitis (1972), Hyperlipidemia, Memory disorder (09/21/2017), OSA (obstructive sleep apnea) (05/08/2018), PONV (postoperative nausea and vomiting), Seizures ([place]), Sleep apnea, and Stroke ([place]). here with:  History of seizures, well controlled  Subarachnoid hemorrhage associated with left frontal stroke in January 2018, subsequent seizures  -[name] is doing great, is really glad to be off Keppra and on Lamictal for seizure prevention  -Continue Lamictal 100 mg twice daily, Keppra resulted in significant drowsiness -Check Lamictal blood level today, CBC, CMP reviewed from September  -Call for any seizure activity, otherwise we will follow-up in 1 year or sooner if needed, she will now be followed by Dr. April Manson since Dr. Jannifer Franklin has retired  Butler Denmark, AGNP-C, [place] 10/06/2021, 10:42 AM [facility] [address], [place], [place] [address] [phone]

## 2021-10-06 ENCOUNTER — Ambulatory Visit: Payer: Medicare HMO | Admitting: Neurology

## 2021-10-06 ENCOUNTER — Encounter: Payer: Self-pay | Admitting: Neurology

## 2021-10-06 VITALS — BP 109/77 | HR 80 | Ht 62.0 in | Wt 184.0 lb

## 2021-10-06 DIAGNOSIS — I6930 Unspecified sequelae of cerebral infarction: Secondary | ICD-10-CM

## 2021-10-06 DIAGNOSIS — R569 Unspecified convulsions: Secondary | ICD-10-CM

## 2021-10-06 MED ORDER — LAMOTRIGINE 100 MG PO TABS: 100.0000 mg | ORAL_TABLET | Freq: Two times a day (BID) | ORAL | 3 refills | Status: DC

## 2021-10-06 NOTE — Patient Instructions (Signed)
Great to see you today! Continue Lamictal at current dose Check Lamictal level  Call for any seizures See you back in 1 year

## 2021-10-07 LAB — LAMOTRIGINE LEVEL: Lamotrigine Lvl: 4.3 ug/mL (ref 2.0–20.0)

## 2021-11-29 DIAGNOSIS — D2272 Melanocytic nevi of left lower limb, including hip: Secondary | ICD-10-CM | POA: Diagnosis not present

## 2021-11-29 DIAGNOSIS — D2262 Melanocytic nevi of left upper limb, including shoulder: Secondary | ICD-10-CM | POA: Diagnosis not present

## 2021-11-29 DIAGNOSIS — D225 Melanocytic nevi of trunk: Secondary | ICD-10-CM | POA: Diagnosis not present

## 2021-11-29 DIAGNOSIS — D2261 Melanocytic nevi of right upper limb, including shoulder: Secondary | ICD-10-CM | POA: Diagnosis not present

## 2021-11-29 DIAGNOSIS — D2271 Melanocytic nevi of right lower limb, including hip: Secondary | ICD-10-CM | POA: Diagnosis not present

## 2021-11-29 DIAGNOSIS — L821 Other seborrheic keratosis: Secondary | ICD-10-CM | POA: Diagnosis not present

## 2021-12-06 DIAGNOSIS — H524 Presbyopia: Secondary | ICD-10-CM | POA: Diagnosis not present

## 2021-12-06 DIAGNOSIS — Z0100 Encounter for examination of eyes and vision without abnormal findings: Secondary | ICD-10-CM | POA: Diagnosis not present

## 2021-12-21 ENCOUNTER — Encounter: Payer: Self-pay | Admitting: Family Medicine

## 2021-12-21 ENCOUNTER — Other Ambulatory Visit: Payer: Self-pay

## 2021-12-21 ENCOUNTER — Other Ambulatory Visit: Payer: Self-pay | Admitting: Family Medicine

## 2021-12-21 ENCOUNTER — Ambulatory Visit (INDEPENDENT_AMBULATORY_CARE_PROVIDER_SITE_OTHER): Payer: Medicare HMO | Admitting: Family Medicine

## 2021-12-21 VITALS — BP 118/76 | HR 100 | Ht 62.0 in | Wt 192.6 lb

## 2021-12-21 DIAGNOSIS — R7309 Other abnormal glucose: Secondary | ICD-10-CM

## 2021-12-21 DIAGNOSIS — R339 Retention of urine, unspecified: Secondary | ICD-10-CM | POA: Diagnosis not present

## 2021-12-21 DIAGNOSIS — Z0000 Encounter for general adult medical examination without abnormal findings: Secondary | ICD-10-CM

## 2021-12-21 DIAGNOSIS — F419 Anxiety disorder, unspecified: Secondary | ICD-10-CM | POA: Diagnosis not present

## 2021-12-21 DIAGNOSIS — E538 Deficiency of other specified B group vitamins: Secondary | ICD-10-CM

## 2021-12-21 DIAGNOSIS — N182 Chronic kidney disease, stage 2 (mild): Secondary | ICD-10-CM

## 2021-12-21 DIAGNOSIS — E7800 Pure hypercholesterolemia, unspecified: Secondary | ICD-10-CM

## 2021-12-21 DIAGNOSIS — E559 Vitamin D deficiency, unspecified: Secondary | ICD-10-CM

## 2021-12-21 DIAGNOSIS — E669 Obesity, unspecified: Secondary | ICD-10-CM

## 2021-12-21 NOTE — Progress Notes (Addendum)
? ?Subjective:  ? ? Patient ID: [name], female    DOB: March 21, 1956, 66 y.o.   MRN: [id number] ? ?[name] is a 66 y.o. female presenting on 12/21/2021 for Depression and Anxiety ? ? ?HPI ? ?Follow up today ?  ?Right Knee swelling ?Chronic episodic problem R knee gradual inc swelling surrounding knee joint, no new injury has had problem with this knee in past with prior arthroscopic procedure. ?Currently not bothering her much but feels more swollen. ?  ?Anxiety ?Currently doing well, mood anxiety are controlled ?On Venlafaxine XR '150mg'$  daily ? ?Urinary Incontinence, leakage ?Reports occasional small amount of urinary leakage on standing, not every time. Not having urinary urge to go void prior to this, but does feel that urge after.  ?She avoids caffeine. Drinks 1 glass ice tea ? ?  ?Health Maintenance: ?Completed Shingrix vaccine x 2 doses ? ? ?Depression screen [facility] 2/9 12/21/2021 06/23/2021 04/19/2021  ?Decreased Interest 0 1 0  ?Down, Depressed, Hopeless 0 1 2  ?PHQ - 2 Score 0 2 2  ?Altered sleeping '1 1 2  '$ ?Tired, decreased energy 0 0 2  ?Change in appetite 0 0 0  ?Feeling bad or failure about yourself  0 1 1  ?Trouble concentrating 0 0 2  ?Moving slowly or fidgety/restless 0 0 0  ?Suicidal thoughts 0 0 0  ?PHQ-9 Score '1 4 9  '$ ?Difficult doing work/chores Not difficult at all Not difficult at all Somewhat difficult  ?Some recent data might be hidden  ? ?GAD 7 : Generalized Anxiety Score 12/21/2021 06/23/2021 03/08/2021 08/19/2020  ?Nervous, Anxious, on Edge 0 1 2 0  ?Control/stop worrying 0 1 2 0  ?Worry too much - different things 0 1 2 0  ?Trouble relaxing 0 1 2 0  ?Restless 0 0 1 0  ?Easily annoyed or irritable 0 1 1 0  ?Afraid - awful might happen 0 0 1 0  ?Total GAD 7 Score 0 5 11 0  ?Anxiety Difficulty Not difficult at all Not difficult at all Somewhat difficult -  ? ? ? ? ?Social History  ? ?Tobacco Use  ? Smoking status: Former  ?  Packs/day: 0.25  ?  Years: 5.00  ?  Pack years: 1.25  ?   Types: Cigarettes  ?  Quit date: 07/25/2001  ?  Years since quitting: 20.4  ? Smokeless tobacco: Never  ?Vaping Use  ? Vaping Use: Never used  ?Substance Use Topics  ? Alcohol use: Yes  ?  Alcohol/week: 0.0 standard drinks  ?  Comment: 2 glasses of wine per week  ? Drug use: No  ? ? ?Review of Systems ?Per HPI unless specifically indicated above ? ?   ?Objective:  ?  ?BP 118/76   Pulse 100   Ht '5\' 2"'$  (1.575 m)   Wt 192 lb 9.6 oz (87.4 kg)   SpO2 100%   BMI 35.23 kg/m?   ?Wt Readings from Last 3 Encounters:  ?12/21/21 192 lb 9.6 oz (87.4 kg)  ?10/06/21 184 lb (83.5 kg)  ?06/23/21 195 lb (88.5 kg)  ?  ?Physical Exam ?Vitals and nursing note reviewed.  ?Constitutional:   ?   General: She is not in acute distress. ?   Appearance: Normal appearance. She is well-developed. She is not diaphoretic.  ?   Comments: Well-appearing, comfortable, cooperative  ?HENT:  ?   Head: Normocephalic and atraumatic.  ?Eyes:  ?   General:     ?   Right eye:  No discharge.     ?   Left eye: No discharge.  ?   Conjunctiva/sclera: Conjunctivae normal.  ?Cardiovascular:  ?   Rate and Rhythm: Normal rate.  ?Pulmonary:  ?   Effort: Pulmonary effort is normal.  ?Skin: ?   General: Skin is warm and dry.  ?   Findings: No erythema or rash.  ?Neurological:  ?   Mental Status: She is alert and oriented to person, place, and time.  ?Psychiatric:     ?   Mood and Affect: Mood normal.     ?   Behavior: Behavior normal.     ?   Thought Content: Thought content normal.  ?   Comments: Well groomed, good eye contact, normal speech and thoughts  ? ? ? ?Results for orders placed or performed in visit on 10/06/21  ?Lamotrigine level  ?Result Value Ref Range  ? Lamotrigine Lvl 4.3 2.0 - 20.0 ug/mL  ? ?   ?Assessment & Plan:  ? ?Problem List Items Addressed This Visit   ? ? Hyperlipidemia - Primary  ? ?Other Visit Diagnoses   ? ? Incomplete bladder emptying      ? Anxiety      ? ?  ? ?HLD ?Controlled on Statin therapy. Continue current  dose. ? ?Anxiety ?Controlled on current medication management SNRI ? ?Incomplete Bladder Emptying ?History suggestive of mild urinary leakage occasionally on change of position, discussed recommendations for functional bladder management with limit bladder irritants alcohol caffeine, double voiding, bladder re-training, and kegel exercise pelvic floor str, follow up if not improved. Not clearly consistent with stress incontinence. ?  ?No orders of the defined types were placed in this encounter. ? ? ? ?Follow up plan: ?Return in about 6 months (around 06/23/2022) for 6 month fasting lab only 1 week later Annual Physical. ? ?Future labs ordered for 06/2022 ? ?Nobie Putnam, DO ?[facility] ?[facility] ?12/21/2021, 10:53 AM ?

## 2021-12-21 NOTE — Patient Instructions (Addendum)
Thank you for coming to the office today. ? ?Consider some bladder techniques to avoid leakage. ?Keep wearing pad for now ?Try double voiding technique, go back and repeat urination about 15-30 min after, make sure emptying completely. ?Limit or avoid excess caffeine and alcohol intake, these are bladder irritants, they can cause bladder spasms and leakage ?It does not sound like stress incontinence, so that is good ?Can try Kegel exercises for pelvic floor strengthening. ? ?DUE for FASTING BLOOD WORK (no food or drink after midnight before the lab appointment, only water or coffee without cream/sugar on the morning of) ? ?SCHEDULE "Lab Only" visit in the morning at the clinic for lab draw in 6 MONTHS  ? ?- Make sure Lab Only appointment is at about 1 week before your next appointment, so that results will be available ? ?For Lab Results, once available within 2-3 days of blood draw, you can can log in to MyChart online to view your results and a brief explanation. Also, we can discuss results at next follow-up visit. ? ? ? ?Please schedule a Follow-up Appointment to: Return in about 6 months (around 06/23/2022) for 6 month fasting lab only 1 week later Annual Physical. ? ?If you have any other questions or concerns, please feel free to call the office or send a message through [place]. You may also schedule an earlier appointment if necessary. ? ?Additionally, you may be receiving a survey about your experience at our office within a few days to 1 week by e-mail or mail. We value your feedback. ? ?Nobie Putnam, DO ?[place] ?

## 2022-02-27 ENCOUNTER — Other Ambulatory Visit: Payer: Self-pay | Admitting: Family Medicine

## 2022-02-27 DIAGNOSIS — R921 Mammographic calcification found on diagnostic imaging of breast: Secondary | ICD-10-CM

## 2022-03-16 ENCOUNTER — Ambulatory Visit
Admission: RE | Admit: 2022-03-16 | Discharge: 2022-03-16 | Disposition: A | Discharge status: Home / Self Care | Payer: Medicare HMO | Source: Ambulatory Visit | Attending: Family Medicine | Admitting: Family Medicine

## 2022-03-16 DIAGNOSIS — R921 Mammographic calcification found on diagnostic imaging of breast: Secondary | ICD-10-CM | POA: Insufficient documentation

## 2022-03-28 ENCOUNTER — Ambulatory Visit
Admission: EM | Admit: 2022-03-28 | Discharge: 2022-03-28 | Disposition: A | Discharge status: Home / Self Care | Payer: Medicare HMO | Attending: Physician Assistant | Admitting: Physician Assistant

## 2022-03-28 DIAGNOSIS — R197 Diarrhea, unspecified: Secondary | ICD-10-CM | POA: Insufficient documentation

## 2022-03-28 DIAGNOSIS — Z8673 Personal history of transient ischemic attack (TIA), and cerebral infarction without residual deficits: Secondary | ICD-10-CM | POA: Diagnosis not present

## 2022-03-28 DIAGNOSIS — E785 Hyperlipidemia, unspecified: Secondary | ICD-10-CM | POA: Diagnosis not present

## 2022-03-28 DIAGNOSIS — R110 Nausea: Secondary | ICD-10-CM | POA: Insufficient documentation

## 2022-03-28 DIAGNOSIS — G4733 Obstructive sleep apnea (adult) (pediatric): Secondary | ICD-10-CM | POA: Insufficient documentation

## 2022-03-28 DIAGNOSIS — Z20822 Contact with and (suspected) exposure to covid-19: Secondary | ICD-10-CM | POA: Diagnosis not present

## 2022-03-28 DIAGNOSIS — R569 Unspecified convulsions: Secondary | ICD-10-CM | POA: Diagnosis not present

## 2022-03-28 DIAGNOSIS — J029 Acute pharyngitis, unspecified: Secondary | ICD-10-CM | POA: Diagnosis not present

## 2022-03-28 DIAGNOSIS — R051 Acute cough: Secondary | ICD-10-CM | POA: Diagnosis not present

## 2022-03-28 DIAGNOSIS — R0981 Nasal congestion: Secondary | ICD-10-CM | POA: Diagnosis not present

## 2022-03-28 LAB — GROUP A STREP BY PCR: Group A Strep by PCR: NOT DETECTED

## 2022-03-28 LAB — SARS CORONAVIRUS 2 BY RT PCR: SARS Coronavirus 2 by RT PCR: NEGATIVE

## 2022-03-28 MED ORDER — LIDOCAINE VISCOUS HCL 2 % MT SOLN: 15.0000 mL | OROMUCOSAL | 0 refills | Status: DC | PRN

## 2022-03-28 MED ORDER — PROMETHAZINE-DM 6.25-15 MG/5ML PO SYRP: 5.0000 mL | ORAL_SOLUTION | Freq: Four times a day (QID) | ORAL | 0 refills | Status: DC | PRN

## 2022-03-28 NOTE — ED Triage Notes (Signed)
Onset 3 days ago pt started with sore throat. No other symptoms.

## 2022-03-28 NOTE — ED Provider Notes (Signed)
[facility]    CSN: [id number] Arrival date & time: 03/28/22  1412      History   Chief Complaint Chief Complaint  Patient presents with   Sore Throat    HPI [name] is a 66 y.o. female presenting for 4-day history of fever up to 101 degrees, fatigue, sore throat, mild cough and congestion.  Patient states that she broke the fever after the first day.  Patient says her sore throat is severe.  She is denying any ear pain, breathing difficulty, or vomiting.  Has had some nausea and loose stools.  No sick contacts.  Has been taking over-the-counter medicine for symptoms.  She reports that she recently flew home from [place] about 2 days before onset of symptoms.  Medical history significant for GERD, hyperlipidemia, OSA, seizures, and stroke.  No other complaints.  HPI  Past Medical History:  Diagnosis Date   Allergy    Anxiety    Arthritis    left hip   Blood transfusion without reported diagnosis    with hip surgery    GERD (gastroesophageal reflux disease)    Hepatitis 1972   hep B   Hyperlipidemia    controlled on meds    Memory disorder 09/21/2017   OSA (obstructive sleep apnea) 05/08/2018   PONV (postoperative nausea and vomiting)    Seizures ([place])    last seizure 11-2016   Sleep apnea    no cpap    Stroke [facility])    per pt     Patient Active Problem List   Diagnosis Date Noted   Insomnia 05/05/2020   CKD (chronic kidney disease), stage II 06/20/2019   History of hemorrhagic cerebrovascular accident (CVA) with residual deficit 05/23/2018   OSA (obstructive sleep apnea) 05/08/2018   Aphasia due to and not concurrent with nontraumatic subarachnoid hemorrhage 10/25/2017   Memory disorder 09/21/2017   Seizures ([place]) 04/09/2017   Aneurysm, carotid artery, internal 01/08/2017   Screening for breast cancer 05/21/2015   Special screening for malignant neoplasms, colon 01/13/2013   Hyperlipidemia 12/19/2012   Obesity (BMI 35.0-39.9 without  comorbidity) 09/23/2012   Recurrent major depression in partial remission ([place]) 07/26/2011    Past Surgical History:  Procedure Laterality Date   ABDOMINAL HYSTERECTOMY  04/1999   ARTHROSCOPIC REPAIR ACL     bilateral knees   BREAST EXCISIONAL BIOPSY Right 2008   NEG   BREAST EXCISIONAL BIOPSY Left YRS AGO   X2 - NEG   BREAST LUMPECTOMY  06/1979; 03/1995   left   CHOLECYSTECTOMY  09/1987   COLONOSCOPY     LAPAROSCOPIC GASTRIC SLEEVE RESECTION  09/30/2012   Procedure: LAPAROSCOPIC GASTRIC SLEEVE RESECTION;  Surgeon: Madilyn Hook, DO;  Location: [facility];  Service: General;  Laterality: N/A;   POLYPECTOMY     TOTAL HIP ARTHROPLASTY  11/13/2012   left   UPPER GASTROINTESTINAL ENDOSCOPY     UPPER GI ENDOSCOPY  09/30/2012   Procedure: UPPER GI ENDOSCOPY;  Surgeon: Madilyn Hook, DO;  Location: [facility];  Service: General;;    OB History   No obstetric history on file.      Home Medications    Prior to Admission medications   Medication Sig Start Date End Date Taking? Authorizing Provider  lidocaine (XYLOCAINE) 2 % solution Use as directed 15 mLs in the mouth or throat every 3 (three) hours as needed for mouth pain (swish and spit). 03/28/22  Yes Danton Clap, PA-C  promethazine-dextromethorphan (PROMETHAZINE-DM) 6.25-15 MG/5ML syrup Take  5 mLs by mouth 4 (four) times daily as needed. 03/28/22  Yes Laurene Footman B, PA-C  B Complex Vitamins (VITAMIN-B COMPLEX PO) Take by mouth daily.    [provider]  lamoTRIgine (LAMICTAL) 100 MG tablet Take 1 tablet (100 mg total) by mouth 2 (two) times daily. 10/06/21   Suzzanne Cloud, NP  Melatonin 3 MG TABS Take 6 mg by mouth at bedtime.     [provider]  Multiple Vitamin (MULTIVITAMIN WITH MINERALS) TABS Take 1 tablet by mouth daily.     [provider]  Omega-3 Fatty Acids (FISH OIL) 1200 MG CAPS Take 1 capsule by mouth daily.    [provider]  rosuvastatin (CRESTOR) 10 MG tablet TAKE 1 TABLET (10 MG  TOTAL) BY MOUTH AT BEDTIME. 09/12/21   Karamalegos, Devonne Doughty, DO  venlafaxine XR (EFFEXOR-XR) 150 MG 24 hr capsule TAKE 1 CAPSULE (150 MG TOTAL) BY MOUTH DAILY WITH BREAKFAST. DOSE CHANGE FROM '75MG'$  UP TO '150MG'$ . Patient taking differently: 150 mg daily with breakfast. 08/29/21   Olin Hauser, DO    Family History Family History  Problem Relation Age of Onset   Hyperlipidemia Mother    Obesity Mother    Pulmonary fibrosis Mother    Cancer Maternal Aunt        ovarian cancer   Cancer Father    Prostate cancer Father    Colon cancer Paternal Grandmother    Esophageal cancer Neg Hx    Stomach cancer Neg Hx    Rectal cancer Neg Hx    Breast cancer Neg Hx    Colon polyps Neg Hx     Social History Social History   Tobacco Use   Smoking status: Former    Packs/day: 0.25    Years: 5.00    Total pack years: 1.25    Types: Cigarettes    Quit date: 07/25/2001    Years since quitting: 20.6   Smokeless tobacco: Never  Vaping Use   Vaping Use: Never used  Substance Use Topics   Alcohol use: Yes    Alcohol/week: 0.0 standard drinks of alcohol    Comment: 2 glasses of wine per week   Drug use: No     Allergies   Patient has no known allergies.   Review of Systems Review of Systems  Constitutional:  Positive for fatigue and fever. Negative for chills and diaphoresis.  HENT:  Positive for congestion, rhinorrhea and sore throat. Negative for ear pain, sinus pressure and sinus pain.   Respiratory:  Positive for cough. Negative for shortness of breath.   Cardiovascular:  Negative for chest pain.  Gastrointestinal:  Negative for abdominal pain, nausea and vomiting.  Musculoskeletal:  Negative for arthralgias and myalgias.  Skin:  Negative for rash.  Neurological:  Negative for weakness and headaches.  Hematological:  Negative for adenopathy.     Physical Exam Triage Vital Signs ED Triage Vitals  Enc Vitals Group     BP      Pulse      Resp      Temp       Temp src      SpO2      Weight      Height      Head Circumference      Peak Flow      Pain Score      Pain Loc      Pain Edu?      Excl. in [place]?  No data found.  Updated Vital Signs BP 119/83 (BP Location: Left Arm)   Pulse 83   Temp 98.6 F (37 C) (Oral)   Resp 18   SpO2 99%      Physical Exam Vitals and nursing note reviewed.  Constitutional:      General: She is not in acute distress.    Appearance: Normal appearance. She is well-developed. She is ill-appearing. She is not toxic-appearing.  HENT:     Head: Normocephalic and atraumatic.     Nose: Congestion present.     Mouth/Throat:     Mouth: Mucous membranes are moist.     Pharynx: Oropharynx is clear. Posterior oropharyngeal erythema present.  Eyes:     General: No scleral icterus.       Right eye: No discharge.        Left eye: No discharge.     Conjunctiva/sclera: Conjunctivae normal.  Cardiovascular:     Rate and Rhythm: Normal rate and regular rhythm.     Heart sounds: Normal heart sounds.  Pulmonary:     Effort: Pulmonary effort is normal. No respiratory distress.     Breath sounds: Normal breath sounds.  Musculoskeletal:     Cervical back: Neck supple.  Skin:    General: Skin is dry.  Neurological:     General: No focal deficit present.     Mental Status: She is alert. Mental status is at baseline.     Motor: No weakness.     Gait: Gait normal.  Psychiatric:        Mood and Affect: Mood normal.        Behavior: Behavior normal.        Thought Content: Thought content normal.      UC Treatments / Results  Labs (all labs ordered are listed, but only abnormal results are displayed) Labs Reviewed  GROUP A STREP BY PCR  SARS CORONAVIRUS 2 BY RT PCR    EKG   Radiology No results found.  Procedures Procedures (including critical care time)  Medications Ordered in UC Medications - No data to display  Initial Impression / Assessment and Plan / UC Course  I have reviewed the triage  vital signs and the nursing notes.  Pertinent labs & imaging results that were available during my care of the patient were reviewed by me and considered in my medical decision making (see chart for details).  65 year old female presenting for 4-day history of fever up to 101 degrees, fatigue, sore throat, cough congestion.  Recently flew home from [facility].  Vitals normal and stable.  Patient ill-appearing but nontoxic.  On exam she has erythema posterior pharynx and nasal congestion.  Chest clear auscultation heart regular rate rhythm.  PCR strep test performed and negative.  PCR COVID test obtained.  Negative COVID.  Discussed result with patient.  Advised patient symptoms consistent with other viral illness.  Report care encouraged.  Sent Promethazine DM and viscous lidocaine to pharmacy.  Reviewed return and ER precautions.   Final Clinical Impressions(s) / UC Diagnoses   Final diagnoses:  Acute pharyngitis, unspecified etiology  Acute cough  Nasal congestion     Discharge Instructions      -Negative strep test. - We are testing for COVID.  There is only back shortly and we will call you with the result.  If you are positive I can send in anti viral medicine for you.  If it is negative this is likely another viral illness which will  need to run its course over 7 to 10 days.  I have sent viscous lidocaine for your throat and also cough medicine unit. - Increase rest and fluids. - Follow-up as needed especially if symptoms worsen or are not improving after the next week.     ED Prescriptions     Medication Sig Dispense Auth. Provider   promethazine-dextromethorphan (PROMETHAZINE-DM) 6.25-15 MG/5ML syrup Take 5 mLs by mouth 4 (four) times daily as needed. 118 mL Laurene Footman B, PA-C   lidocaine (XYLOCAINE) 2 % solution Use as directed 15 mLs in the mouth or throat every 3 (three) hours as needed for mouth pain (swish and spit). 100 mL Danton Clap, PA-C      PDMP  not reviewed this encounter.   Danton Clap, PA-C 03/28/22 1739

## 2022-03-28 NOTE — Discharge Instructions (Signed)
-  Negative strep test. - We are testing for COVID.  There is only back shortly and we will call you with the result.  If you are positive I can send in anti viral medicine for you.  If it is negative this is likely another viral illness which will need to run its course over 7 to 10 days.  I have sent viscous lidocaine for your throat and also cough medicine unit. - Increase rest and fluids. - Follow-up as needed especially if symptoms worsen or are not improving after the next week.

## 2022-04-18 ENCOUNTER — Ambulatory Visit: Payer: Medicare HMO

## 2022-04-22 ENCOUNTER — Other Ambulatory Visit: Payer: Self-pay | Admitting: Family Medicine

## 2022-04-22 DIAGNOSIS — E7800 Pure hypercholesterolemia, unspecified: Secondary | ICD-10-CM

## 2022-04-24 ENCOUNTER — Emergency Department: Payer: Medicare HMO

## 2022-04-24 ENCOUNTER — Observation Stay: Payer: Medicare HMO | Admitting: Certified Registered"

## 2022-04-24 ENCOUNTER — Other Ambulatory Visit: Payer: Self-pay

## 2022-04-24 ENCOUNTER — Observation Stay
Admission: EM | Admit: 2022-04-24 | Discharge: 2022-04-25 | Disposition: A | Discharge status: Home / Self Care | Payer: Medicare HMO | Attending: General Surgery | Admitting: General Surgery

## 2022-04-24 ENCOUNTER — Encounter
Admission: EM | Disposition: A | Discharge status: Home / Self Care | Payer: Self-pay | Source: Home / Self Care | Attending: Emergency Medicine

## 2022-04-24 DIAGNOSIS — Z8673 Personal history of transient ischemic attack (TIA), and cerebral infarction without residual deficits: Secondary | ICD-10-CM | POA: Insufficient documentation

## 2022-04-24 DIAGNOSIS — K3580 Unspecified acute appendicitis: Secondary | ICD-10-CM | POA: Diagnosis not present

## 2022-04-24 DIAGNOSIS — K353 Acute appendicitis with localized peritonitis, without perforation or gangrene: Principal | ICD-10-CM | POA: Insufficient documentation

## 2022-04-24 DIAGNOSIS — Z96642 Presence of left artificial hip joint: Secondary | ICD-10-CM | POA: Diagnosis not present

## 2022-04-24 DIAGNOSIS — G4733 Obstructive sleep apnea (adult) (pediatric): Secondary | ICD-10-CM | POA: Diagnosis not present

## 2022-04-24 DIAGNOSIS — Z87891 Personal history of nicotine dependence: Secondary | ICD-10-CM | POA: Insufficient documentation

## 2022-04-24 DIAGNOSIS — R1032 Left lower quadrant pain: Secondary | ICD-10-CM | POA: Diagnosis present

## 2022-04-24 DIAGNOSIS — I10 Essential (primary) hypertension: Secondary | ICD-10-CM | POA: Diagnosis not present

## 2022-04-24 DIAGNOSIS — K36 Other appendicitis: Secondary | ICD-10-CM | POA: Diagnosis not present

## 2022-04-24 DIAGNOSIS — Z79899 Other long term (current) drug therapy: Secondary | ICD-10-CM | POA: Insufficient documentation

## 2022-04-24 DIAGNOSIS — T8859XA Other complications of anesthesia, initial encounter: Secondary | ICD-10-CM | POA: Diagnosis not present

## 2022-04-24 DIAGNOSIS — R109 Unspecified abdominal pain: Secondary | ICD-10-CM | POA: Diagnosis not present

## 2022-04-24 HISTORY — PX: XI ROBOTIC LAPAROSCOPIC ASSISTED APPENDECTOMY: SHX6877

## 2022-04-24 LAB — COMPREHENSIVE METABOLIC PANEL
ALT: 27 U/L (ref 0–44)
AST: 23 U/L (ref 15–41)
Albumin: 3.7 g/dL (ref 3.5–5.0)
Alkaline Phosphatase: 77 U/L (ref 38–126)
Anion gap: 5 (ref 5–15)
BUN: 17 mg/dL (ref 8–23)
CO2: 25 mmol/L (ref 22–32)
Calcium: 8.7 mg/dL — ABNORMAL LOW (ref 8.9–10.3)
Chloride: 109 mmol/L (ref 98–111)
Creatinine, Ser: 0.93 mg/dL (ref 0.44–1.00)
GFR, Estimated: >60 mL/min (ref >60)
Glucose, Bld: 106 mg/dL — ABNORMAL HIGH (ref 70–99)
Potassium: 4.1 mmol/L (ref 3.5–5.1)
Sodium: 139 mmol/L (ref 135–145)
Total Bilirubin: 0.6 mg/dL (ref 0.3–1.2)
Total Protein: 6.4 g/dL — ABNORMAL LOW (ref 6.5–8.1)

## 2022-04-24 LAB — URINALYSIS, ROUTINE W REFLEX MICROSCOPIC
Bilirubin Urine: NEGATIVE
Glucose, UA: NEGATIVE mg/dL
Hgb urine dipstick: NEGATIVE
Ketones, ur: NEGATIVE mg/dL
Nitrite: NEGATIVE
Protein, ur: NEGATIVE mg/dL
Specific Gravity, Urine: 1.02 (ref 1.005–1.030)
pH: 7 (ref 5.0–8.0)

## 2022-04-24 LAB — CBC
HCT: 39.6 % (ref 36.0–46.0)
Hemoglobin: 12.9 g/dL (ref 12.0–15.0)
MCH: 31.3 pg (ref 26.0–34.0)
MCHC: 32.6 g/dL (ref 30.0–36.0)
MCV: 96.1 fL (ref 80.0–100.0)
Platelets: 269 10*3/uL (ref 150–400)
RBC: 4.12 MIL/uL (ref 3.87–5.11)
RDW: 13.2 % (ref 11.5–15.5)
WBC: 14.3 10*3/uL — ABNORMAL HIGH (ref 4.0–10.5)
nRBC: 0 % (ref 0.0–0.2)

## 2022-04-24 LAB — LIPASE, BLOOD: Lipase: 23 U/L (ref 11–51)

## 2022-04-24 LAB — URINALYSIS, MICROSCOPIC (REFLEX): Bacteria, UA: NONE SEEN

## 2022-04-24 SURGERY — APPENDECTOMY, ROBOT-ASSISTED, LAPAROSCOPIC
Anesthesia: General | Site: Abdomen

## 2022-04-24 MED ORDER — MORPHINE SULFATE (PF) 4 MG/ML IV SOLN
4.0000 mg | INTRAVENOUS | Status: DC | PRN
  Administered 2022-04-24: 4 mg via INTRAVENOUS
  Filled 2022-04-24 (×2): qty 1

## 2022-04-24 MED ORDER — LIDOCAINE HCL (CARDIAC) PF 100 MG/5ML IV SOSY
PREFILLED_SYRINGE | INTRAVENOUS | Status: DC | PRN
  Administered 2022-04-24: 40 mg via INTRAVENOUS

## 2022-04-24 MED ORDER — PROPOFOL 10 MG/ML IV BOLUS
INTRAVENOUS | Status: DC | PRN
  Administered 2022-04-24: 90 mg via INTRAVENOUS

## 2022-04-24 MED ORDER — CHLORHEXIDINE GLUCONATE 0.12 % MT SOLN
15.0000 mL | Freq: Once | OROMUCOSAL | Status: AC
  Administered 2022-04-24: 15 mL via OROMUCOSAL

## 2022-04-24 MED ORDER — LIDOCAINE HCL (PF) 2 % IJ SOLN
INTRAMUSCULAR | Status: AC
  Filled 2022-04-24: qty 5

## 2022-04-24 MED ORDER — ENOXAPARIN SODIUM 40 MG/0.4ML IJ SOSY
40.0000 mg | PREFILLED_SYRINGE | INTRAMUSCULAR | Status: DC
  Administered 2022-04-24: 40 mg via SUBCUTANEOUS
  Filled 2022-04-24: qty 0.4

## 2022-04-24 MED ORDER — B COMPLEX-C PO TABS
1.0000 | ORAL_TABLET | Freq: Every day | ORAL | Status: DC
  Administered 2022-04-25: 1 via ORAL
  Filled 2022-04-24: qty 1

## 2022-04-24 MED ORDER — PANTOPRAZOLE SODIUM 40 MG IV SOLR
40.0000 mg | Freq: Every day | INTRAVENOUS | Status: DC
  Administered 2022-04-24: 40 mg via INTRAVENOUS
  Filled 2022-04-24: qty 10

## 2022-04-24 MED ORDER — ACETAMINOPHEN 10 MG/ML IV SOLN
INTRAVENOUS | Status: DC | PRN
  Administered 2022-04-24: 1000 mg via INTRAVENOUS

## 2022-04-24 MED ORDER — FENTANYL CITRATE (PF) 100 MCG/2ML IJ SOLN
INTRAMUSCULAR | Status: DC | PRN
  Administered 2022-04-24: 25 ug via INTRAVENOUS
  Administered 2022-04-24: 75 ug via INTRAVENOUS

## 2022-04-24 MED ORDER — OXYCODONE HCL 5 MG/5ML PO SOLN: 5.0000 mg | Freq: Once | ORAL | Status: DC | PRN

## 2022-04-24 MED ORDER — ROCURONIUM BROMIDE 10 MG/ML (PF) SYRINGE
PREFILLED_SYRINGE | INTRAVENOUS | Status: AC
  Filled 2022-04-24: qty 10

## 2022-04-24 MED ORDER — ACETAMINOPHEN 10 MG/ML IV SOLN
INTRAVENOUS | Status: AC
  Filled 2022-04-24: qty 100

## 2022-04-24 MED ORDER — FENTANYL CITRATE (PF) 100 MCG/2ML IJ SOLN: 25.0000 ug | INTRAMUSCULAR | Status: DC | PRN

## 2022-04-24 MED ORDER — DEXMEDETOMIDINE HCL IN NACL 200 MCG/50ML IV SOLN
INTRAVENOUS | Status: DC | PRN
  Administered 2022-04-24 (×3): 4 ug via INTRAVENOUS

## 2022-04-24 MED ORDER — CHLORHEXIDINE GLUCONATE 0.12 % MT SOLN
OROMUCOSAL | Status: AC
  Filled 2022-04-24: qty 15

## 2022-04-24 MED ORDER — ONDANSETRON HCL 4 MG/2ML IJ SOLN
INTRAMUSCULAR | Status: DC | PRN
  Administered 2022-04-24: 4 mg via INTRAVENOUS

## 2022-04-24 MED ORDER — BUPIVACAINE-EPINEPHRINE (PF) 0.5% -1:200000 IJ SOLN
INTRAMUSCULAR | Status: AC
  Filled 2022-04-24: qty 30

## 2022-04-24 MED ORDER — DEXAMETHASONE SODIUM PHOSPHATE 10 MG/ML IJ SOLN
INTRAMUSCULAR | Status: AC
  Filled 2022-04-24: qty 1

## 2022-04-24 MED ORDER — BUPIVACAINE-EPINEPHRINE (PF) 0.5% -1:200000 IJ SOLN
INTRAMUSCULAR | Status: DC | PRN
  Administered 2022-04-24: 30 mL via PERINEURAL

## 2022-04-24 MED ORDER — SUGAMMADEX SODIUM 200 MG/2ML IV SOLN
INTRAVENOUS | Status: DC | PRN
  Administered 2022-04-24: 200 mg via INTRAVENOUS

## 2022-04-24 MED ORDER — ONDANSETRON HCL 4 MG/2ML IJ SOLN: 4.0000 mg | Freq: Four times a day (QID) | INTRAMUSCULAR | Status: DC | PRN

## 2022-04-24 MED ORDER — PIPERACILLIN-TAZOBACTAM 3.375 G IVPB
3.3750 g | Freq: Three times a day (TID) | INTRAVENOUS | Status: DC
  Administered 2022-04-24: 3.375 g via INTRAVENOUS

## 2022-04-24 MED ORDER — SUCCINYLCHOLINE CHLORIDE 200 MG/10ML IV SOSY
PREFILLED_SYRINGE | INTRAVENOUS | Status: DC | PRN
  Administered 2022-04-24: 80 mg via INTRAVENOUS

## 2022-04-24 MED ORDER — LACTATED RINGERS IV SOLN: INTRAVENOUS | Status: DC | PRN

## 2022-04-24 MED ORDER — ONDANSETRON 4 MG PO TBDP: 4.0000 mg | ORAL_TABLET | Freq: Four times a day (QID) | ORAL | Status: DC | PRN

## 2022-04-24 MED ORDER — ONDANSETRON HCL 4 MG/2ML IJ SOLN
INTRAMUSCULAR | Status: AC
  Filled 2022-04-24: qty 2

## 2022-04-24 MED ORDER — FIBRIN SEALANT 2 ML SINGLE DOSE KIT
PACK | CUTANEOUS | Status: DC | PRN
  Administered 2022-04-24: 2 mL via TOPICAL

## 2022-04-24 MED ORDER — PIPERACILLIN-TAZOBACTAM 3.375 G IVPB
INTRAVENOUS | Status: AC
  Filled 2022-04-24: qty 50

## 2022-04-24 MED ORDER — MIDAZOLAM HCL 2 MG/2ML IJ SOLN
INTRAMUSCULAR | Status: DC | PRN
  Administered 2022-04-24: 2 mg via INTRAVENOUS

## 2022-04-24 MED ORDER — ACETAMINOPHEN 325 MG PO TABS: 650.0000 mg | ORAL_TABLET | Freq: Four times a day (QID) | ORAL | Status: DC | PRN

## 2022-04-24 MED ORDER — VENLAFAXINE HCL ER 75 MG PO CP24
150.0000 mg | ORAL_CAPSULE | Freq: Every day | ORAL | Status: DC
  Administered 2022-04-25: 150 mg via ORAL
  Filled 2022-04-24: qty 2

## 2022-04-24 MED ORDER — DEXAMETHASONE SODIUM PHOSPHATE 10 MG/ML IJ SOLN
INTRAMUSCULAR | Status: DC | PRN
  Administered 2022-04-24: 5 mg via INTRAVENOUS

## 2022-04-24 MED ORDER — ACETAMINOPHEN 650 MG RE SUPP: 650.0000 mg | Freq: Four times a day (QID) | RECTAL | Status: DC | PRN

## 2022-04-24 MED ORDER — HYDROCODONE-ACETAMINOPHEN 5-325 MG PO TABS
1.0000 | ORAL_TABLET | ORAL | Status: DC | PRN
  Administered 2022-04-24: 1 via ORAL
  Filled 2022-04-24 (×2): qty 1

## 2022-04-24 MED ORDER — 0.9 % SODIUM CHLORIDE (POUR BTL) OPTIME
TOPICAL | Status: DC | PRN
  Administered 2022-04-24: 500 mL

## 2022-04-24 MED ORDER — LAMOTRIGINE 100 MG PO TABS
100.0000 mg | ORAL_TABLET | Freq: Two times a day (BID) | ORAL | Status: DC
  Administered 2022-04-24 – 2022-04-25 (×2): 100 mg via ORAL
  Filled 2022-04-24 (×2): qty 1

## 2022-04-24 MED ORDER — MELATONIN 5 MG PO TABS
5.0000 mg | ORAL_TABLET | Freq: Every day | ORAL | Status: DC
  Administered 2022-04-24: 5 mg via ORAL
  Filled 2022-04-24: qty 1

## 2022-04-24 MED ORDER — ADULT MULTIVITAMIN W/MINERALS CH
1.0000 | ORAL_TABLET | Freq: Every day | ORAL | Status: DC
  Administered 2022-04-25: 1 via ORAL
  Filled 2022-04-24: qty 1

## 2022-04-24 MED ORDER — PROPOFOL 10 MG/ML IV BOLUS
INTRAVENOUS | Status: AC
  Filled 2022-04-24: qty 20

## 2022-04-24 MED ORDER — MIDAZOLAM HCL 2 MG/2ML IJ SOLN
INTRAMUSCULAR | Status: AC
  Filled 2022-04-24: qty 2

## 2022-04-24 MED ORDER — FENTANYL CITRATE (PF) 100 MCG/2ML IJ SOLN
INTRAMUSCULAR | Status: AC
  Filled 2022-04-24: qty 2

## 2022-04-24 MED ORDER — SUCCINYLCHOLINE CHLORIDE 200 MG/10ML IV SOSY
PREFILLED_SYRINGE | INTRAVENOUS | Status: AC
  Filled 2022-04-24: qty 10

## 2022-04-24 MED ORDER — IOHEXOL 300 MG/ML  SOLN
100.0000 mL | Freq: Once | INTRAMUSCULAR | Status: AC | PRN
  Administered 2022-04-24: 100 mL via INTRAVENOUS

## 2022-04-24 MED ORDER — LACTATED RINGERS IV SOLN: INTRAVENOUS | Status: DC

## 2022-04-24 MED ORDER — DEXMEDETOMIDINE HCL IN NACL 80 MCG/20ML IV SOLN
INTRAVENOUS | Status: AC
  Filled 2022-04-24: qty 20

## 2022-04-24 MED ORDER — ROCURONIUM BROMIDE 100 MG/10ML IV SOLN
INTRAVENOUS | Status: DC | PRN
  Administered 2022-04-24: 40 mg via INTRAVENOUS

## 2022-04-24 MED ORDER — OXYCODONE HCL 5 MG PO TABS: 5.0000 mg | ORAL_TABLET | Freq: Once | ORAL | Status: DC | PRN

## 2022-04-24 SURGICAL SUPPLY — 69 items
APPLICATOR VISTASEAL 35 (MISCELLANEOUS) ×1 IMPLANT
BAG PRESSURE INF REUSE 1000 (BAG) IMPLANT
BLADE SURG SZ11 CARB STEEL (BLADE) ×2 IMPLANT
CANNULA REDUC XI 12-8 STAPL (CANNULA) ×1
CANNULA REDUCER 12-8 DVNC XI (CANNULA) ×1 IMPLANT
COVER TIP SHEARS 8 DVNC (MISCELLANEOUS) ×1 IMPLANT
COVER TIP SHEARS 8MM DA VINCI (MISCELLANEOUS) ×1
DERMABOND ADVANCED (GAUZE/BANDAGES/DRESSINGS) ×1
DERMABOND ADVANCED .7 DNX12 (GAUZE/BANDAGES/DRESSINGS) ×1 IMPLANT
DRAPE ARM DVNC X/XI (DISPOSABLE) ×4 IMPLANT
DRAPE COLUMN DVNC XI (DISPOSABLE) ×1 IMPLANT
DRAPE DA VINCI XI ARM (DISPOSABLE) ×4
DRAPE DA VINCI XI COLUMN (DISPOSABLE) ×1
ELECT REM PT RETURN 9FT ADLT (ELECTROSURGICAL) ×2
ELECTRODE REM PT RTRN 9FT ADLT (ELECTROSURGICAL) ×1 IMPLANT
GLOVE BIOGEL PI IND STRL 6.5 (GLOVE) ×2 IMPLANT
GLOVE BIOGEL PI INDICATOR 6.5 (GLOVE) ×2
GLOVE SURG SYN 6.5 ES PF (GLOVE) ×4 IMPLANT
GLOVE SURG SYN 6.5 PF PI (GLOVE) ×2 IMPLANT
GOWN STRL REUS W/ TWL LRG LVL3 (GOWN DISPOSABLE) ×3 IMPLANT
GOWN STRL REUS W/TWL LRG LVL3 (GOWN DISPOSABLE) ×3
GRASPER SUT TROCAR 14GX15 (MISCELLANEOUS) IMPLANT
IRRIGATOR SUCT 8 DISP DVNC XI (IRRIGATION / IRRIGATOR) IMPLANT
IRRIGATOR SUCTION 8MM XI DISP (IRRIGATION / IRRIGATOR)
IV NS 1000ML (IV SOLUTION)
IV NS 1000ML BAXH (IV SOLUTION) IMPLANT
KIT PINK PAD W/HEAD ARE REST (MISCELLANEOUS) ×2 IMPLANT
KIT PINK PAD W/HEAD ARM REST (MISCELLANEOUS) ×1 IMPLANT
LABEL OR SOLS (LABEL) IMPLANT
MANIFOLD NEPTUNE II (INSTRUMENTS) ×2 IMPLANT
NDL INSUFFLATION 14GA 120MM (NEEDLE) ×1 IMPLANT
NEEDLE HYPO 22GX1.5 SAFETY (NEEDLE) ×2 IMPLANT
NEEDLE INSUFFLATION 14GA 120MM (NEEDLE) ×2 IMPLANT
OBTURATOR OPTICAL STANDARD 8MM (TROCAR) ×1
OBTURATOR OPTICAL STND 8 DVNC (TROCAR) ×1
OBTURATOR OPTICALSTD 8 DVNC (TROCAR) ×1 IMPLANT
PACK LAP CHOLECYSTECTOMY (MISCELLANEOUS) ×2 IMPLANT
RELOAD STAPLE 45 2.5 WHT DVNC (STAPLE) IMPLANT
RELOAD STAPLE 45 3.5 BLU DVNC (STAPLE) ×1 IMPLANT
RELOAD STAPLER 2.5X45 WHT DVNC (STAPLE) IMPLANT
RELOAD STAPLER 3.5X45 BLU DVNC (STAPLE) ×1 IMPLANT
SEAL CANN UNIV 5-8 DVNC XI (MISCELLANEOUS) ×3 IMPLANT
SEAL XI 5MM-8MM UNIVERSAL (MISCELLANEOUS) ×3
SEALER VESSEL DA VINCI XI (MISCELLANEOUS)
SEALER VESSEL EXT DVNC XI (MISCELLANEOUS) IMPLANT
SET TUBE SMOKE EVAC HIGH FLOW (TUBING) ×2 IMPLANT
SOLUTION ELECTROLUBE (MISCELLANEOUS) ×2 IMPLANT
SPONGE T-LAP 4X18 ~~LOC~~+RFID (SPONGE) ×2 IMPLANT
STAPLER 45 DA VINCI SURE FORM (STAPLE) ×1
STAPLER 45 SUREFORM DVNC (STAPLE) ×1 IMPLANT
STAPLER CANNULA SEAL DVNC XI (STAPLE) ×1 IMPLANT
STAPLER CANNULA SEAL XI (STAPLE) ×1
STAPLER RELOAD 2.5X45 WHITE (STAPLE)
STAPLER RELOAD 2.5X45 WHT DVNC (STAPLE)
STAPLER RELOAD 3.5X45 BLU DVNC (STAPLE) ×1
STAPLER RELOAD 3.5X45 BLUE (STAPLE) ×1
SUT MNCRL 4-0 (SUTURE) ×1
SUT MNCRL 4-0 27XMFL (SUTURE) ×1
SUT MNCRL AB 4-0 PS2 18 (SUTURE) ×2 IMPLANT
SUT VIC AB 2-0 SH 27 (SUTURE) ×1
SUT VIC AB 2-0 SH 27XBRD (SUTURE) ×1 IMPLANT
SUT VICRYL 0 AB UR-6 (SUTURE) ×2 IMPLANT
SUT VLOC 90 6 CV-15 VIOLET (SUTURE) ×2 IMPLANT
SUTURE MNCRL 4-0 27XMF (SUTURE) IMPLANT
SYR 30ML LL (SYRINGE) ×2 IMPLANT
SYS BAG RETRIEVAL 10MM (BASKET) ×2
SYSTEM BAG RETRIEVAL 10MM (BASKET) ×1 IMPLANT
TRAY FOLEY MTR SLVR 16FR STAT (SET/KITS/TRAYS/PACK) IMPLANT
WATER STERILE IRR 500ML POUR (IV SOLUTION) ×2 IMPLANT

## 2022-04-24 NOTE — Anesthesia Preprocedure Evaluation (Signed)
Anesthesia Evaluation  Patient identified by MRN, date of birth, ID band Patient awake    Reviewed: Allergy & Precautions, NPO status , Patient's Chart, lab work & pertinent test results  Airway Mallampati: II  TM Distance: >3 FB Neck ROM: full    Dental  (+) Teeth Intact   Pulmonary neg pulmonary ROS, sleep apnea , former smoker,    Pulmonary exam normal        Cardiovascular negative cardio ROS Normal cardiovascular exam     Neuro/Psych PSYCHIATRIC DISORDERS CVA    GI/Hepatic negative GI ROS, Neg liver ROS, GERD  ,  Endo/Other  negative endocrine ROS  Renal/GU      Musculoskeletal   Abdominal   Peds  Hematology negative hematology ROS (+)   Anesthesia Other Findings Past Medical History: No date: Allergy No date: Anxiety No date: Arthritis     Comment:  left hip No date: Blood transfusion without reported diagnosis     Comment:  with hip surgery  No date: GERD (gastroesophageal reflux disease) 1972: Hepatitis     Comment:  hep B No date: Hyperlipidemia     Comment:  controlled on meds  09/21/2017: Memory disorder 05/08/2018: OSA (obstructive sleep apnea) No date: PONV (postoperative nausea and vomiting) No date: Seizures ([place])     Comment:  last seizure 11-2016 No date: Sleep apnea     Comment:  no cpap  No date: Stroke [facility])     Comment:  per pt   Past Surgical History: 04/1999: ABDOMINAL HYSTERECTOMY No date: ARTHROSCOPIC REPAIR ACL     Comment:  bilateral knees 2008: BREAST EXCISIONAL BIOPSY; Right     Comment:  NEG YRS AGO: BREAST EXCISIONAL BIOPSY; Left     Comment:  X2 - NEG 06/1979; 03/1995: BREAST LUMPECTOMY     Comment:  left 09/1987: CHOLECYSTECTOMY No date: COLONOSCOPY 09/30/2012: LAPAROSCOPIC GASTRIC SLEEVE RESECTION     Comment:  Procedure: LAPAROSCOPIC GASTRIC SLEEVE RESECTION;                Surgeon: Madilyn Hook, DO;  Location: [facility];  Service:               General;   Laterality: N/A; No date: POLYPECTOMY 11/13/2012: TOTAL HIP ARTHROPLASTY     Comment:  left No date: UPPER GASTROINTESTINAL ENDOSCOPY 09/30/2012: UPPER GI ENDOSCOPY     Comment:  Procedure: UPPER GI ENDOSCOPY;  Surgeon: Madilyn Hook,               DO;  Location: [facility];  Service: General;;  BMI    Body Mass Index: 32.92 kg/m      Reproductive/Obstetrics negative OB ROS                             Anesthesia Physical Anesthesia Plan  ASA: 2  Anesthesia Plan: General ETT   Post-op Pain Management:    Induction: Intravenous  PONV Risk Score and Plan: 3 and Ondansetron, Dexamethasone, Midazolam and Treatment may vary due to age or medical condition  Airway Management Planned: Oral ETT  Additional Equipment:   Intra-op Plan:   Post-operative Plan: Extubation in OR  Informed Consent: I have reviewed the patients History and Physical, chart, labs and discussed the procedure including the risks, benefits and alternatives for the proposed anesthesia with the patient or authorized representative who has indicated his/her understanding and acceptance.     Dental Advisory Given  Plan Discussed with: Anesthesiologist, CRNA  and Surgeon  Anesthesia Plan Comments: (Patient consented for risks of anesthesia including but not limited to:  - adverse reactions to medications - damage to eyes, teeth, lips or other oral mucosa - nerve damage due to positioning  - sore throat or hoarseness - Damage to heart, brain, nerves, lungs, other parts of body or loss of life  Patient voiced understanding.)        Anesthesia Quick Evaluation

## 2022-04-24 NOTE — ED Provider Notes (Signed)
[facility] Provider Note    Event Date/Time   First MD Initiated Contact with Patient 04/24/22 1302     (approximate)   History   Abdominal Pain   HPI  [name] is a 66 y.o. female with history of hyperlipidemia, seizures, other chronic medical problems presents emergency department with right lower quadrant pain.  Patient has history of cholecystectomy.  States pain started overnight.  No diarrhea.  No vomiting.  No fever or chills.      Physical Exam   Triage Vital Signs: ED Triage Vitals  Enc Vitals Group     BP 04/24/22 1107 119/82     Pulse Rate 04/24/22 1107 82     Resp 04/24/22 1107 16     Temp 04/24/22 1107 98.5 F (36.9 C)     Temp Source 04/24/22 1107 Oral     SpO2 04/24/22 1107 97 %     Weight 04/24/22 1108 180 lb (81.6 kg)     Height 04/24/22 1108 '5\' 2"'$  (1.575 m)     Head Circumference --      Peak Flow --      Pain Score 04/24/22 1034 5     Pain Loc --      Pain Edu? --      Excl. in [place]? --     Most recent vital signs: Vitals:   04/24/22 1107  BP: 119/82  Pulse: 82  Resp: 16  Temp: 98.5 F (36.9 C)  SpO2: 97%     General: Awake, no distress.   CV:  Good peripheral perfusion. regular rate and  rhythm Resp:  Normal effort. Lungs CTA Abd:  No distention.  Abdomen is tender to palpation in the right lower quadrant Other:      ED Results / Procedures / Treatments   Labs (all labs ordered are listed, but only abnormal results are displayed) Labs Reviewed  COMPREHENSIVE METABOLIC PANEL - Abnormal; Notable for the following components:      Result Value   Glucose, Bld 106 (*)    Calcium 8.7 (*)    Total Protein 6.4 (*)    All other components within normal limits  CBC - Abnormal; Notable for the following components:   WBC 14.3 (*)    All other components within normal limits  URINALYSIS, ROUTINE W REFLEX MICROSCOPIC - Abnormal; Notable for the following components:   Leukocytes,Ua TRACE (*)     All other components within normal limits  LIPASE, BLOOD  URINALYSIS, MICROSCOPIC (REFLEX)     EKG     RADIOLOGY CT abdomen/pelvis IV contrast    PROCEDURES:   Procedures   MEDICATIONS ORDERED IN ED: Medications  iohexol (OMNIPAQUE) 300 MG/ML solution 100 mL (100 mLs Intravenous Contrast Given 04/24/22 1216)     IMPRESSION / MDM / ASSESSMENT AND PLAN / ED COURSE  I reviewed the triage vital signs and the nursing notes.                              Differential diagnosis includes, but is not limited to, gastroenteritis, acute appendicitis, kidney stone  Patient's presentation is most consistent with acute presentation with potential threat to life or bodily function.   Patient's labs are concerning for infection as her WBC is elevated at 14.3, remainder of her labs are normal, prehensile metabolic panel urinalysis and lipase are normal  CT abdomen/pelvis was individually interpreted and reviewed by me.  Acute appendicitis noted.  Confirmed by radiology.  Consult to Dr. Peyton Najjar from surgery, he is coming to see the patient.  Patient is aware that she has acute appendicitis.  She was instructed to stay n.p.o.  Dr. Peyton Najjar in to see the patient.  She will be admitted to his service   FINAL CLINICAL IMPRESSION(S) / ED DIAGNOSES   Final diagnoses:  Acute appendicitis, unspecified acute appendicitis type     Rx / DC Orders   ED Discharge Orders     None        Note:  This document was prepared using Dragon voice recognition software and may include unintentional dictation errors.    Versie Starks, PA-C 04/24/22 1508    Naaman Plummer, MD 04/24/22 [phone]

## 2022-04-24 NOTE — H&P (Signed)
SURGICAL CONSULTATION NOTE   HISTORY OF PRESENT ILLNESS (HPI):  66 y.o. female presented to [facility] ED for evaluation of abdominal pain since last night. Patient reports started having abdominal pain last night.  Pain localized to the right lower quadrant.  Pain does not radiate to other part of body.  Pain aggravated by abdominal wall movement and pressure.  She cannot identify any alleviating factors.  She denies any fever or chills.  She denies nausea or vomiting.  In the ED she was found with leukocytosis.  No significant electrolyte disturbance.  CT scan of the abdomen and pelvis shows periappendiceal fat stranding without free air or free fluid.  No abscess.  I personally evaluated the images of the CT scan.  Surgery is consulted by Dr. Cheri Fowler in this context for evaluation and management of acute appendicitis.  PAST MEDICAL HISTORY (PMH):  Past Medical History:  Diagnosis Date   Allergy    Anxiety    Arthritis    left hip   Blood transfusion without reported diagnosis    with hip surgery    GERD (gastroesophageal reflux disease)    Hepatitis 1972   hep B   Hyperlipidemia    controlled on meds    Memory disorder 09/21/2017   OSA (obstructive sleep apnea) 05/08/2018   PONV (postoperative nausea and vomiting)    Seizures ([place])    last seizure 11-2016   Sleep apnea    no cpap    Stroke ([place])    per pt      PAST SURGICAL HISTORY ([place]):  Past Surgical History:  Procedure Laterality Date   ABDOMINAL HYSTERECTOMY  04/1999   ARTHROSCOPIC REPAIR ACL     bilateral knees   BREAST EXCISIONAL BIOPSY Right 2008   NEG   BREAST EXCISIONAL BIOPSY Left YRS AGO   X2 - NEG   BREAST LUMPECTOMY  06/1979; 03/1995   left   CHOLECYSTECTOMY  09/1987   COLONOSCOPY     LAPAROSCOPIC GASTRIC SLEEVE RESECTION  09/30/2012   Procedure: LAPAROSCOPIC GASTRIC SLEEVE RESECTION;  Surgeon: Madilyn Hook, DO;  Location: [facility] ORS;  Service: General;  Laterality: N/A;   POLYPECTOMY     TOTAL HIP ARTHROPLASTY   11/13/2012   left   UPPER GASTROINTESTINAL ENDOSCOPY     UPPER GI ENDOSCOPY  09/30/2012   Procedure: UPPER GI ENDOSCOPY;  Surgeon: Madilyn Hook, DO;  Location: [facility] ORS;  Service: General;;     MEDICATIONS:  Prior to Admission medications   Medication Sig Start Date End Date Taking? Authorizing Provider  B Complex Vitamins (VITAMIN-B COMPLEX PO) Take by mouth daily.    [provider]  lamoTRIgine (LAMICTAL) 100 MG tablet Take 1 tablet (100 mg total) by mouth 2 (two) times daily. 10/06/21   Suzzanne Cloud, NP  lidocaine (XYLOCAINE) 2 % solution Use as directed 15 mLs in the mouth or throat every 3 (three) hours as needed for mouth pain (swish and spit). 03/28/22   Danton Clap, PA-C  Melatonin 3 MG TABS Take 6 mg by mouth at bedtime.     [provider]  Multiple Vitamin (MULTIVITAMIN WITH MINERALS) TABS Take 1 tablet by mouth daily.     [provider]  Omega-3 Fatty Acids (FISH OIL) 1200 MG CAPS Take 1 capsule by mouth daily.    [provider]  promethazine-dextromethorphan (PROMETHAZINE-DM) 6.25-15 MG/5ML syrup Take 5 mLs by mouth 4 (four) times daily as needed. 03/28/22   Laurene Footman B, PA-C  rosuvastatin (CRESTOR) 10  MG tablet TAKE 1 TABLET AT BEDTIME 04/24/22   Karamalegos, Alexander J, DO  venlafaxine XR (EFFEXOR-XR) 150 MG 24 hr capsule TAKE 1 CAPSULE (150 MG TOTAL) BY MOUTH DAILY WITH BREAKFAST. DOSE CHANGE FROM '75MG'$  UP TO '150MG'$ . Patient taking differently: 150 mg daily with breakfast. 08/29/21   Karamalegos, Devonne Doughty, DO     ALLERGIES:  No Known Allergies   SOCIAL HISTORY:  Social History   Socioeconomic History   Marital status: Married    Spouse name: Not on file   Number of children: 0   Years of education: BS   Highest education level: Not on file  Occupational History   Not on file  Tobacco Use   Smoking status: Former    Packs/day: 0.25    Years: 5.00    Total pack years: 1.25    Types: Cigarettes    Quit date: 07/25/2001     Years since quitting: 20.7   Smokeless tobacco: Never  Vaping Use   Vaping Use: Never used  Substance and Sexual Activity   Alcohol use: Yes    Alcohol/week: 0.0 standard drinks of alcohol    Comment: 2 glasses of wine per week   Drug use: No   Sexual activity: Not on file  Other Topics Concern   Not on file  Social History Narrative   Lives with spouse   Caffeine use: Drinks coffee daily   Right handed   Social Determinants of Health   Financial Resource Strain: Low Risk  (04/19/2021)   Overall Financial Resource Strain (CARDIA)    Difficulty of Paying Living Expenses: Not hard at all  Food Insecurity: No Food Insecurity (04/19/2021)   Hunger Vital Sign    Worried About Running Out of Food in the Last Year: Never true    Ran Out of Food in the Last Year: Never true  Transportation Needs: No Transportation Needs (04/19/2021)   PRAPARE - Hydrologist (Medical): No    Lack of Transportation (Non-Medical): No  Physical Activity: Inactive (04/19/2021)   Exercise Vital Sign    Days of Exercise per Week: 0 days    Minutes of Exercise per Session: 0 min  Stress: No Stress Concern Present (04/19/2021)   [place]    Feeling of Stress : Only a little  Social Connections: Not on file  Intimate Partner Violence: Not on file      FAMILY HISTORY:  Family History  Problem Relation Age of Onset   Hyperlipidemia Mother    Obesity Mother    Pulmonary fibrosis Mother    Cancer Maternal Aunt        ovarian cancer   Cancer Father    Prostate cancer Father    Colon cancer Paternal Grandmother    Esophageal cancer Neg Hx    Stomach cancer Neg Hx    Rectal cancer Neg Hx    Breast cancer Neg Hx    Colon polyps Neg Hx      REVIEW OF SYSTEMS:  Constitutional: denies weight loss, fever, chills, or sweats  Eyes: denies any other vision changes, history of eye injury  ENT: denies sore  throat, hearing problems  Respiratory: denies shortness of breath, wheezing  Cardiovascular: denies chest pain, palpitations  Gastrointestinal: positive abdominal pain, denies nausea and vomiting Genitourinary: denies burning with urination or urinary frequency Musculoskeletal: denies any other joint pains or cramps  Skin: denies any other rashes or skin discolorations  Neurological: denies any other headache, dizziness, weakness  Psychiatric: denies any other depression, anxiety   All other review of systems were negative   VITAL SIGNS:  Temp:  [98.5 F (36.9 C)] 98.5 F (36.9 C) (07/17 1107) Pulse Rate:  [82] 82 (07/17 1107) Resp:  [16] 16 (07/17 1107) BP: (119)/(82) 119/82 (07/17 1107) SpO2:  [97 %] 97 % (07/17 1107) Weight:  [81.6 kg] 81.6 kg (07/17 1108)     Height: '5\' 2"'$  (157.5 cm) Weight: 81.6 kg BMI (Calculated): 32.91   INTAKE/OUTPUT:  This shift: No intake/output data recorded.  Last 2 shifts: '@IOLAST2SHIFTS'$ @   PHYSICAL EXAM:  Constitutional:  -- Normal body habitus  -- Awake, alert, and oriented x3  Eyes:  -- Pupils equally round and reactive to light  -- No scleral icterus  Ear, nose, and throat:  -- No jugular venous distension  Pulmonary:  -- No crackles  -- Equal breath sounds bilaterally -- Breathing non-labored at rest Cardiovascular:  -- S1, S2 present  -- No pericardial rubs Gastrointestinal:  -- Abdomen soft, tender in the right lower quadrant, non-distended, no guarding or rebound tenderness -- No abdominal masses appreciated, pulsatile or otherwise  Musculoskeletal and Integumentary:  -- Wounds or skin discoloration: None appreciated -- Extremities: B/L UE and LE FROM, hands and feet warm, no edema  Neurologic:  -- Motor function: intact and symmetric -- Sensation: intact and symmetric   Labs:     Latest Ref Rng & Units 04/24/2022   11:13 AM 06/20/2021    9:15 AM 06/16/2020    9:44 AM  CBC  WBC 4.0 - 10.5 K/uL 14.3  8.0  8.3   Hemoglobin  12.0 - 15.0 g/dL 12.9  13.0  12.5   Hematocrit 36.0 - 46.0 % 39.6  40.0  38.6   Platelets 150 - 400 K/uL 269  291  270       Latest Ref Rng & Units 04/24/2022   11:13 AM 06/20/2021    9:15 AM 06/16/2020    9:44 AM  CMP  Glucose 70 - 99 mg/dL 106  90  86   BUN 8 - 23 mg/dL '17  16  13   '$ Creatinine 0.44 - 1.00 mg/dL 0.93  1.00  1.00   Sodium 135 - 145 mmol/L 139  143  143   Potassium 3.5 - 5.1 mmol/L 4.1  4.1  3.8   Chloride 98 - 111 mmol/L 109  109  109   CO2 22 - 32 mmol/L '25  27  27   '$ Calcium 8.9 - 10.3 mg/dL 8.7  9.4  8.8   Total Protein 6.5 - 8.1 g/dL 6.4  6.1  5.8   Total Bilirubin 0.3 - 1.2 mg/dL 0.6  0.5  0.5   Alkaline Phos 38 - 126 U/L 77     AST 15 - 41 U/L '23  14  13   '$ ALT 0 - 44 U/L '27  16  9     '$ Imaging studies:  EXAM: CT ABDOMEN AND PELVIS WITH CONTRAST   TECHNIQUE: Multidetector CT imaging of the abdomen and pelvis was performed using the standard protocol following bolus administration of intravenous contrast.   RADIATION DOSE REDUCTION: This exam was performed according to the departmental dose-optimization program which includes automated exposure control, adjustment of the mA and/or kV according to patient size and/or use of iterative reconstruction technique.   CONTRAST:  174m OMNIPAQUE IOHEXOL 300 MG/ML  SOLN   COMPARISON:  None Available.   FINDINGS: Lower  chest: No acute abnormality.  Small hiatal hernia.   Hepatobiliary: No focal liver abnormality is seen. Status post cholecystectomy. No biliary dilatation.   Pancreas: Unremarkable. No pancreatic ductal dilatation or surrounding inflammatory changes.   Spleen: Normal in size without focal abnormality.   Adrenals/Urinary Tract: Adrenals are unremarkable. Kidneys are unremarkable. Bladder is poorly distended and there is streak artifact the pelvis.   Stomach/Bowel: Appendix measures mildly dilated the base at 8 mm. There is surrounding fat infiltration. Bowel is normal in caliber. Prior  sleeve gastrectomy.   Vascular/Lymphatic: No significant vascular abnormality. No enlarged nodes.   Reproductive: Status post hysterectomy. No adnexal masses.   Other: No free air.  No abscess.   Musculoskeletal: No acute osseous abnormality. Left hip replacement with associated streak artifact.   IMPRESSION: Acute appendicitis without complication.   Small hiatal hernia.     Electronically Signed   By: Macy Mis M.D.   On: 04/24/2022 12:34  Assessment/Plan:  66 y.o. female with acute appendicitis, complicated by pertinent comorbidities including seizures, history of stroke, hyperlipidemia, hypertension.  Patient with history, physical exam and images consistent with acute appendicitis. Patient oriented about diagnosis and surgical management as treatment. Patient oriented about goals of surgery and its risk including: bowel injury, infection, abscess, bleeding, leak from cecum, intestinal adhesions, bowel obstruction, fistula, injury to the ureter among others.  Patient understood and agreed to proceed with surgery. Will admit patient, already started on antibiotic therapy, will give IV hydration since patient is NPO and schedule to OR.   Arnold Long, MD

## 2022-04-24 NOTE — Anesthesia Procedure Notes (Signed)
Procedure Name: Intubation Date/Time: 04/24/2022 3:20 PM  Performed by: Cammie Sickle, CRNAPre-anesthesia Checklist: Patient identified, Patient being monitored, Timeout performed, Emergency Drugs available and Suction available Patient Re-evaluated:Patient Re-evaluated prior to induction Oxygen Delivery Method: Circle system utilized Preoxygenation: Pre-oxygenation with 100% oxygen Induction Type: IV induction, Rapid sequence and Cricoid Pressure applied Laryngoscope Size: 3 and McGraph Grade View: Grade I Tube type: Oral Tube size: 7.0 mm Number of attempts: 1 Airway Equipment and Method: Stylet Placement Confirmation: ETT inserted through vocal cords under direct vision, positive ETCO2 and breath sounds checked- equal and bilateral Secured at: 22 cm Tube secured with: Tape Dental Injury: Teeth and Oropharynx as per pre-operative assessment

## 2022-04-24 NOTE — ED Notes (Signed)
Report given to [facility] nurse and [facility] nurse

## 2022-04-24 NOTE — ED Triage Notes (Addendum)
Pt comes with c/o right sided pain that started two days ago.

## 2022-04-24 NOTE — Op Note (Signed)
Pre-op Diagnosis: Acute appendicitis   Post op Diagnosis: Acute appenditicis  Procedure: Robotic assisted laparoscopic appendectomy.  Anesthesia: GETA  Surgeon: Herbert Pun, MD, FACS  Wound Classification: clean contaminated  Specimen: Appendix  Complications: None  Estimated Blood Loss: 3 mL   Indications: Patient is a 66 y.o. female  presented with above right lower quadrant pain. CT scan shows acute appendicitis.     FIndings: 1.  Irritated appendix  2. No peri-appendiceal abscess or phlegmon 3. Normal anatomy 4. Adequate hemostasis.   Description of procedure: The patient was placed on the operating table in the supine position. General anesthesia was induced. A time-out was completed verifying correct patient, procedure, site, positioning, and implant(s) and/or special equipment prior to beginning this procedure. The abdomen was prepped and draped in the usual sterile fashion.   Palmer's point located and Veress needle was inserted.  After confirming 2 clicks and a positive saline drop test, gas insufflation was initiated until the abdominal pressure was measured at 15 mmHg.  Afterwards, the Veress needle was removed and a 8 mm port was placed in left upper quadrant area using Optiview technique.  After local was infused, 3 additional incision on the left abdominal wall were made 5 cm apart.  An 12 mm port and two other 8 mm ports were placed under direct visualization.  No injuries from trocar placements were noted.  The table was placed in the Trendelenburg position with the right side elevated.  With the use of prograsp, force bipolar and monopolar scissors, an inflamed appendix was identified and elevated.  Window created at base of appendix in the mesentery.    The mesoappendix was divided with combination of bipolar energy and monopolar scissors.  The base of the appendix was ligated with 3-0 V-Loc.  The appendix was divided with monopolar scissors.  A second layer  of the 3 oh V-Loc was done over the appendiceal stump.  The appendiceal stump was examined and hemostasis noted. No other pathology was identified within pelvis. The 12 mm trocar removed and port site closed with PMI using 0 vicryl under direct vision. Remaining trocars were removed under direct vision. No bleeding was noted.The abdomen was allowed to collapse.  All skin incisions then closed with subcuticular sutures Monocryl 4-0.  Wounds then dressed with dermabond.  The patient tolerated the procedure well, awakened from anesthesia and was taken to the postanesthesia care unit in satisfactory condition.  Sponge count and instrument count correct at the end of the procedure.

## 2022-04-24 NOTE — Telephone Encounter (Signed)
Requested Prescriptions  Pending Prescriptions Disp Refills  . rosuvastatin (CRESTOR) 10 MG tablet [Pharmacy Med Name: ROSUVASTATIN CALCIUM 10 MG Tablet] 90 tablet 0    Sig: TAKE 1 TABLET AT BEDTIME     Cardiovascular:  Antilipid - Statins 2 Failed - 04/22/2022  4:05 AM      Failed - Lipid Panel in normal range within the last 12 months    Cholesterol  Date Value Ref Range Status  06/20/2021 150 <200 mg/dL Final   LDL Cholesterol (Calc)  Date Value Ref Range Status  06/20/2021 66 mg/dL (calc) Final    Comment:    Reference range: <100 . Desirable range <100 mg/dL for primary prevention;   <70 mg/dL for patients with CHD or diabetic patients  with > or = 2 CHD risk factors. Marland Kitchen LDL-C is now calculated using the Martin-Hopkins  calculation, which is a validated novel method providing  better accuracy than the Friedewald equation in the  estimation of LDL-C.  Cresenciano Genre et al. Annamaria Helling. 3614;431(54): 2061-2068  (http://education.[employer].com/faq/FAQ164)    Direct LDL  Date Value Ref Range Status  12/19/2012 146.6 mg/dL Final    Comment:    Optimal:  <100 mg/dLNear or Above Optimal:  100-129 mg/dLBorderline High:  130-159 mg/dLHigh:  160-189 mg/dLVery High:  >190 mg/dL   HDL  Date Value Ref Range Status  06/20/2021 65 > OR = 50 mg/dL Final   Triglycerides  Date Value Ref Range Status  06/20/2021 107 <150 mg/dL Final         Passed - Cr in normal range and within 360 days    Creat  Date Value Ref Range Status  06/20/2021 1.00 0.50 - 1.05 mg/dL Final   Creatinine,U  Date Value Ref Range Status  03/31/2014 113.8 mg/dL Final         Passed - Patient is not pregnant      Passed - Valid encounter within last 12 months    Recent Outpatient Visits          4 months ago Pure hypercholesterolemia   [place], DO   10 months ago Annual physical exam   [place], DO   10 months ago  Neck pain on left side   [place], DO   1 year ago Moderate episode of recurrent major depressive disorder [facility])   [place], DO   1 year ago Moderate episode of recurrent major depressive disorder [facility])   [place], Devonne Doughty, DO      Future Appointments            In 4 days  [facility], [place]   In 2 months [place], Devonne Doughty, [facility], [facility]

## 2022-04-24 NOTE — ED Notes (Signed)
See triage note  Presents with RLQ pain with some n/v  States pain started last pm   denies any fever

## 2022-04-24 NOTE — ED Provider Triage Note (Signed)
Emergency Medicine Provider Triage Evaluation Note  [name] , a 66 y.o. female  was evaluated in triage.  Pt complains of right lower quadrant pain.  It started last night..  Review of Systems  Positive: Right lower quadrant pain Negative: Fever chills  Physical Exam  BP 119/82 (BP Location: Left Arm)   Pulse 82   Temp 98.5 F (36.9 C) (Oral)   Resp 16   Ht '5\' 2"'$  (1.575 m)   Wt 81.6 kg   SpO2 97%   BMI 32.92 kg/m  Gen:   Awake, no distress   Resp:  Normal effort  MSK:   Moves extremities without difficulty  Other:  Tender in right lower quadrant  Medical Decision Making  Medically screening exam initiated at 11:17 AM.  Appropriate orders placed.  [name] was informed that the remainder of the evaluation will be completed by another provider, this initial triage assessment does not replace that evaluation, and the importance of remaining in the ED until their evaluation is complete.  Patient has history of cholecystectomy.  We will do CT abdomen pelvis with IV contrast to rule out appendicitis   Versie Starks, PA-C 04/24/22 1117

## 2022-04-24 NOTE — Anesthesia Postprocedure Evaluation (Signed)
Anesthesia Post Note  Patient: Shivon Hackel  Procedure(s) Performed: XI ROBOTIC LAPAROSCOPIC ASSISTED APPENDECTOMY (Abdomen)  Patient location during evaluation: PACU Anesthesia Type: General Level of consciousness: awake and alert Pain management: pain level controlled Vital Signs Assessment: post-procedure vital signs reviewed and stable Respiratory status: spontaneous breathing, nonlabored ventilation, respiratory function stable and patient connected to nasal cannula oxygen Cardiovascular status: blood pressure returned to baseline and stable Postop Assessment: no apparent nausea or vomiting Anesthetic complications: no   No notable events documented.   Last Vitals:  Vitals:   04/24/22 1700 04/24/22 1715  BP: 107/66 108/62  Pulse: 64 66  Resp: 12 15  Temp: (!) 36.1 C 36.4 C  SpO2: 100% 98%    Last Pain:  Vitals:   04/24/22 1736  TempSrc:   PainSc: 5                  Precious Haws Brandilee Pies

## 2022-04-24 NOTE — Transfer of Care (Signed)
Immediate Anesthesia Transfer of Care Note  Patient: [name]  Procedure(s) Performed: XI ROBOTIC LAPAROSCOPIC ASSISTED APPENDECTOMY (Abdomen)  Patient Location: PACU  Anesthesia Type:General  Level of Consciousness: awake  Airway & Oxygen Therapy: Patient Spontanous Breathing  Post-op Assessment: Report given to RN and Post -op Vital signs reviewed and stable  Post vital signs: Reviewed and stable  Last Vitals:  Vitals Value Taken Time  BP 114/69 04/24/22 1639  Temp 36.8 C 04/24/22 1639  Pulse 76 04/24/22 1642  Resp 12 04/24/22 1642  SpO2 94 % 04/24/22 1642  Vitals shown include unvalidated device data.  Last Pain:  Vitals:   04/24/22 1639  TempSrc:   PainSc: Asleep         Complications: No notable events documented.

## 2022-04-25 ENCOUNTER — Encounter: Payer: Self-pay | Admitting: General Surgery

## 2022-04-25 MED ORDER — HYDROCODONE-ACETAMINOPHEN 5-325 MG PO TABS: 1.0000 | ORAL_TABLET | ORAL | 0 refills | Status: DC | PRN

## 2022-04-25 NOTE — Progress Notes (Signed)
Pt discharged per MD order. IV removed. Discharge instructions reviewed with pt. Pt verbalized understanding. All questions answered to pt satisfaction. Pt taken out in wheelchair by volunteer.  

## 2022-04-25 NOTE — Discharge Instructions (Signed)
  Diet: Resume home heart healthy regular diet.   Activity: No heavy lifting >20 pounds (children, pets, laundry, garbage) or strenuous activity until follow-up, but light activity and walking are encouraged. Do not drive or drink alcohol if taking narcotic pain medications.  Wound care: May shower with soapy water and pat dry (do not rub incisions), but no baths or submerging incision underwater until follow-up. (no swimming)   Medications: Resume all home medications except . For mild to moderate pain: acetaminophen (Tylenol) or ibuprofen (if no kidney disease). Combining Tylenol with alcohol can substantially increase your risk of causing liver disease. Narcotic pain medications, if prescribed, can be used for severe pain, though may cause nausea, constipation, and drowsiness. Do not combine Tylenol and Norco within a 6 hour period as Norco contains Tylenol. If you do not need the narcotic pain medication, you do not need to fill the prescription.  Call office [phone]) at any time if any questions, worsening pain, fevers/chills, bleeding, drainage from incision site, or other concerns.

## 2022-04-25 NOTE — TOC Initial Note (Signed)
Transition of Care [facility]) - Initial/Assessment Note    Patient Details  Name: [name] MRN: [id number] Date of Birth: Jun 17, 1956  Transition of Care [facility]) CM/SW Contact:    Beverly Sessions, RN Phone Number: 04/25/2022, 9:34 AM  Clinical Narrative:                   Transition of Care [facility]) Screening Note   Patient Details  Name: [name] Date of Birth: 1955/11/01   Transition of Care [facility]) CM/SW Contact:    Beverly Sessions, RN Phone Number: 04/25/2022, 9:34 AM    Transition of Care Department [facility]) has reviewed patient and no TOC needs have been identified at this time. We will continue to monitor patient advancement through interdisciplinary progression rounds. If new patient transition needs arise, please place a TOC consult.         Patient Goals and CMS Choice        Expected Discharge Plan and Services           Expected Discharge Date: 04/25/22                                    Prior Living Arrangements/Services                       Activities of Daily Living Home Assistive Devices/Equipment: Eyeglasses ADL Screening (condition at time of admission) Patient's cognitive ability adequate to safely complete daily activities?: Yes Is the patient deaf or have difficulty hearing?: No Does the patient have difficulty seeing, even when wearing glasses/contacts?: No Does the patient have difficulty concentrating, remembering, or making decisions?: No Patient able to express need for assistance with ADLs?: Yes Does the patient have difficulty dressing or bathing?: No Independently performs ADLs?: Yes (appropriate for developmental age) Does the patient have difficulty walking or climbing stairs?: No Weakness of Legs: None Weakness of Arms/Hands: None  Permission Sought/Granted                  Emotional Assessment              Admission diagnosis:  Acute appendicitis with localized peritonitis  [K35.30] Acute appendicitis, unspecified acute appendicitis type [K35.80] Patient Active Problem List   Diagnosis Date Noted   Acute appendicitis with localized peritonitis 04/24/2022   Insomnia 05/05/2020   CKD (chronic kidney disease), stage II 06/20/2019   History of hemorrhagic cerebrovascular accident (CVA) with residual deficit 05/23/2018   OSA (obstructive sleep apnea) 05/08/2018   Aphasia due to and not concurrent with nontraumatic subarachnoid hemorrhage 10/25/2017   Memory disorder 09/21/2017   Seizures ([place]) 04/09/2017   Aneurysm, carotid artery, internal 01/08/2017   Screening for breast cancer 05/21/2015   Special screening for malignant neoplasms, colon 01/13/2013   Hyperlipidemia 12/19/2012   Obesity (BMI 35.0-39.9 without comorbidity) 09/23/2012   Recurrent major depression in partial remission ([place]) 07/26/2011   PCP:  Olin Hauser, DO Pharmacy:   [place], [place] - [place] [address] Phone: [phone] Fax: [phone]  [facility] [place], [place] [address] Phone: [phone] Fax: [phone]  [id number] [id number] [place] [id number]: [phone]: [phone]    Social Determinants of  Health (SDOH) Interventions    Readmission Risk Interventions     No data to display

## 2022-04-25 NOTE — Discharge Summary (Signed)
  Patient ID: [name] MRN: [id number] DOB/AGE: 1956-08-03 66 y.o.  Admit date: 04/24/2022 Discharge date: 04/25/2022   Discharge Diagnoses:  Principal Problem:   Acute appendicitis with localized peritonitis   Procedures: Robotic assisted laparoscopic appendectomy  Hospital Course: Patient admitted with acute appendicitis.  She underwent appendectomy.  She tolerated the procedure well.  This morning patient with pain control.  Tolerating diet.  Ambulating.  Wounds are dry and clean.  Physical Exam Vitals reviewed.  HENT:     Head: Normocephalic.  Cardiovascular:     Rate and Rhythm: Normal rate and regular rhythm.  Pulmonary:     Effort: Pulmonary effort is normal.  Abdominal:     General: Abdomen is flat. Bowel sounds are normal.     Palpations: Abdomen is soft.  Skin:    General: Skin is warm.     Capillary Refill: Capillary refill takes less than 2 seconds.  Neurological:     Mental Status: She is alert and oriented to person, place, and time.      Consults: None  Disposition: Discharge disposition: 01-Home or Self Care       Discharge Instructions     Diet - low sodium heart healthy   Complete by: As directed    Increase activity slowly   Complete by: As directed       Allergies as of 04/25/2022   No Known Allergies      Medication List     TAKE these medications    Fish Oil 1200 MG Caps Take 1 capsule by mouth daily.   HYDROcodone-acetaminophen 5-325 MG tablet Commonly known as: NORCO/VICODIN Take 1-2 tablets by mouth every 4 (four) hours as needed for moderate pain.   lamoTRIgine 100 MG tablet Commonly known as: LAMICTAL Take 1 tablet (100 mg total) by mouth 2 (two) times daily.   lidocaine 2 % solution Commonly known as: XYLOCAINE Use as directed 15 mLs in the mouth or throat every 3 (three) hours as needed for mouth pain (swish and spit).   melatonin 3 MG Tabs tablet Take 6 mg by mouth at bedtime.   multivitamin with  minerals Tabs tablet Take 1 tablet by mouth daily.   promethazine-dextromethorphan 6.25-15 MG/5ML syrup Commonly known as: PROMETHAZINE-DM Take 5 mLs by mouth 4 (four) times daily as needed.   rosuvastatin 10 MG tablet Commonly known as: CRESTOR TAKE 1 TABLET AT BEDTIME   venlafaxine XR 150 MG 24 hr capsule Commonly known as: EFFEXOR-XR TAKE 1 CAPSULE (150 MG TOTAL) BY MOUTH DAILY WITH BREAKFAST. DOSE CHANGE FROM '75MG'$  UP TO '150MG'$ .   VITAMIN-B COMPLEX PO Take by mouth daily.        Follow-up Information     Herbert Pun, MD Follow up in 2 week(s).   Specialty: General Surgery Why: Follow up after appendectomy Contact information: [address] [place] [address] [phone]

## 2022-04-27 LAB — SURGICAL PATHOLOGY

## 2022-04-28 ENCOUNTER — Other Ambulatory Visit: Payer: Self-pay | Admitting: Family Medicine

## 2022-04-28 ENCOUNTER — Ambulatory Visit: Payer: Medicare HMO

## 2022-04-28 DIAGNOSIS — F3341 Major depressive disorder, recurrent, in partial remission: Secondary | ICD-10-CM

## 2022-04-28 NOTE — Telephone Encounter (Signed)
Requested Prescriptions  Pending Prescriptions Disp Refills  . venlafaxine XR (EFFEXOR-XR) 150 MG 24 hr capsule [Pharmacy Med Name: VENLAFAXINE HCL ER 150 MG Capsule Extended Release 24 Hour] 90 capsule 1    Sig: TAKE 1 CAPSULE (150 MG TOTAL) BY MOUTH DAILY WITH BREAKFAST. DOSE CHANGE FROM '75MG'$  UP TO '150MG'$ .     Psychiatry: Antidepressants - SNRI - desvenlafaxine & venlafaxine Failed - 04/28/2022  1:45 PM      Failed - Lipid Panel in normal range within the last 12 months    Cholesterol  Date Value Ref Range Status  06/20/2021 150 <200 mg/dL Final   LDL Cholesterol (Calc)  Date Value Ref Range Status  06/20/2021 66 mg/dL (calc) Final    Comment:    Reference range: <100 . Desirable range <100 mg/dL for primary prevention;   <70 mg/dL for patients with CHD or diabetic patients  with > or = 2 CHD risk factors. Marland Kitchen LDL-C is now calculated using the Martin-Hopkins  calculation, which is a validated novel method providing  better accuracy than the Friedewald equation in the  estimation of LDL-C.  Cresenciano Genre et al. Annamaria Helling. 4010;272(53): 2061-2068  (http://education.[employer].com/faq/FAQ164)    Direct LDL  Date Value Ref Range Status  12/19/2012 146.6 mg/dL Final    Comment:    Optimal:  <100 mg/dLNear or Above Optimal:  100-129 mg/dLBorderline High:  130-159 mg/dLHigh:  160-189 mg/dLVery High:  >190 mg/dL   HDL  Date Value Ref Range Status  06/20/2021 65 > OR = 50 mg/dL Final   Triglycerides  Date Value Ref Range Status  06/20/2021 107 <150 mg/dL Final         Passed - Cr in normal range and within 360 days    Creat  Date Value Ref Range Status  06/20/2021 1.00 0.50 - 1.05 mg/dL Final   Creatinine, Ser  Date Value Ref Range Status  04/24/2022 0.93 0.44 - 1.00 mg/dL Final   Creatinine,U  Date Value Ref Range Status  03/31/2014 113.8 mg/dL Final         Passed - Completed PHQ-2 or PHQ-9 in the last 360 days      Passed - Last BP in normal range    BP Readings  from Last 1 Encounters:  04/24/22 94/65         Passed - Valid encounter within last 6 months    Recent Outpatient Visits          4 months ago Pure hypercholesterolemia   [place], DO   10 months ago Annual physical exam   [place], DO   10 months ago Neck pain on left side   [place], DO   1 year ago Moderate episode of recurrent major depressive disorder [facility])   [place], DO   1 year ago Moderate episode of recurrent major depressive disorder [facility])   [facility] [place], Devonne Doughty, DO      Future Appointments            In 2 months [place], Devonne Doughty, DO [facility]

## 2022-05-02 ENCOUNTER — Ambulatory Visit: Payer: Medicare HMO

## 2022-05-06 ENCOUNTER — Encounter: Payer: Self-pay | Admitting: Family Medicine

## 2022-05-08 NOTE — Telephone Encounter (Signed)
Please review.  KP

## 2022-05-09 DIAGNOSIS — R69 Illness, unspecified: Secondary | ICD-10-CM | POA: Diagnosis not present

## 2022-06-15 ENCOUNTER — Ambulatory Visit
Admission: EM | Admit: 2022-06-15 | Discharge: 2022-06-15 | Disposition: A | Discharge status: Home / Self Care | Payer: Medicare HMO | Attending: Physician Assistant | Admitting: Physician Assistant

## 2022-06-15 ENCOUNTER — Encounter: Payer: Self-pay | Admitting: Emergency Medicine

## 2022-06-15 DIAGNOSIS — R051 Acute cough: Secondary | ICD-10-CM | POA: Insufficient documentation

## 2022-06-15 DIAGNOSIS — J069 Acute upper respiratory infection, unspecified: Secondary | ICD-10-CM | POA: Diagnosis not present

## 2022-06-15 DIAGNOSIS — Z20822 Contact with and (suspected) exposure to covid-19: Secondary | ICD-10-CM | POA: Insufficient documentation

## 2022-06-15 DIAGNOSIS — J029 Acute pharyngitis, unspecified: Secondary | ICD-10-CM | POA: Insufficient documentation

## 2022-06-15 LAB — SARS CORONAVIRUS 2 BY RT PCR: SARS Coronavirus 2 by RT PCR: NEGATIVE

## 2022-06-15 NOTE — Discharge Instructions (Signed)
-  We will call if the COVID test is positive and I can send antiviral medicine to the pharmacy for you. - If you are positive you will need to isolate 5 days and wear a mask for 5 days.  Today would be the first day. - You should increase your rest and fluids.  May take over-the-counter Robitussin, throat lozenges, Chloraseptic spray, Tylenol. - Follow-up as needed especially if you have a return and/or worsening of the fever, worsening cough, shortness of breath.

## 2022-06-15 NOTE — ED Triage Notes (Signed)
Pt presents with ST and fever since yesterday. She was around someone who tested positive for Covid.

## 2022-06-15 NOTE — ED Provider Notes (Signed)
[facility]    CSN: [id number] Arrival date & time: 06/15/22  0845      History   Chief Complaint Chief Complaint  Patient presents with   Sore Throat   Fever    HPI [name] is a 66 y.o. female presenting for sore throat, temps up to 100 degrees, cough and congestion since yesterday.  Patient reports that she was exposed to someone with COVID the day before.  She says her throat pain is about 5 out of 10.  She denies any associated runny nose, headaches, body aches, fatigue, chest pain, breathing occultly, vomiting or diarrhea.  Is concerned about possible COVID and would be interested in antiviral medicine if her test is positive.  She has not been taking any over-the-counter medication for her symptoms.  She does have medical history significant for anxiety, arthritis, hyperlipidemia, OSA, seizures, and stroke.  HPI  Past Medical History:  Diagnosis Date   Allergy    Anxiety    Arthritis    left hip   Blood transfusion without reported diagnosis    with hip surgery    GERD (gastroesophageal reflux disease)    Hepatitis 1972   hep B   Hyperlipidemia    controlled on meds    Memory disorder 09/21/2017   OSA (obstructive sleep apnea) 05/08/2018   PONV (postoperative nausea and vomiting)    Seizures ([place])    last seizure 11-2016   Sleep apnea    no cpap    Stroke [facility])    per pt     Patient Active Problem List   Diagnosis Date Noted   Acute appendicitis with localized peritonitis 04/24/2022   Insomnia 05/05/2020   CKD (chronic kidney disease), stage II 06/20/2019   History of hemorrhagic cerebrovascular accident (CVA) with residual deficit 05/23/2018   OSA (obstructive sleep apnea) 05/08/2018   Aphasia due to and not concurrent with nontraumatic subarachnoid hemorrhage 10/25/2017   Memory disorder 09/21/2017   Seizures ([place]) 04/09/2017   Aneurysm, carotid artery, internal 01/08/2017   Screening for breast cancer 05/21/2015   Special  screening for malignant neoplasms, colon 01/13/2013   Hyperlipidemia 12/19/2012   Obesity (BMI 35.0-39.9 without comorbidity) 09/23/2012   Recurrent major depression in partial remission ([place]) 07/26/2011    Past Surgical History:  Procedure Laterality Date   ABDOMINAL HYSTERECTOMY  04/1999   ARTHROSCOPIC REPAIR ACL     bilateral knees   BREAST EXCISIONAL BIOPSY Right 2008   NEG   BREAST EXCISIONAL BIOPSY Left YRS AGO   X2 - NEG   BREAST LUMPECTOMY  06/1979; 03/1995   left   CHOLECYSTECTOMY  09/1987   COLONOSCOPY     LAPAROSCOPIC GASTRIC SLEEVE RESECTION  09/30/2012   Procedure: LAPAROSCOPIC GASTRIC SLEEVE RESECTION;  Surgeon: Madilyn Hook, DO;  Location: [facility];  Service: General;  Laterality: N/A;   POLYPECTOMY     TOTAL HIP ARTHROPLASTY  11/13/2012   left   UPPER GASTROINTESTINAL ENDOSCOPY     UPPER GI ENDOSCOPY  09/30/2012   Procedure: UPPER GI ENDOSCOPY;  Surgeon: Madilyn Hook, DO;  Location: [facility];  Service: General;;   XI ROBOTIC LAPAROSCOPIC ASSISTED APPENDECTOMY N/A 04/24/2022   Procedure: XI ROBOTIC LAPAROSCOPIC ASSISTED APPENDECTOMY;  Surgeon: Herbert Pun, MD;  Location: [facility] ORS;  Service: General;  Laterality: N/A;    OB History   No obstetric history on file.      Home Medications    Prior to Admission medications   Medication Sig Start Date End  Date Taking? Authorizing Provider  B Complex Vitamins (VITAMIN-B COMPLEX PO) Take by mouth daily.    [provider]  HYDROcodone-acetaminophen (NORCO/VICODIN) 5-325 MG tablet Take 1-2 tablets by mouth every 4 (four) hours as needed for moderate pain. 04/25/22   Herbert Pun, MD  lamoTRIgine (LAMICTAL) 100 MG tablet Take 1 tablet (100 mg total) by mouth 2 (two) times daily. 10/06/21   Suzzanne Cloud, NP  lidocaine (XYLOCAINE) 2 % solution Use as directed 15 mLs in the mouth or throat every 3 (three) hours as needed for mouth pain (swish and spit). 03/28/22   Danton Clap, PA-C  Melatonin 3 MG  TABS Take 6 mg by mouth at bedtime.     [provider]  Multiple Vitamin (MULTIVITAMIN WITH MINERALS) TABS Take 1 tablet by mouth daily.     [provider]  Omega-3 Fatty Acids (FISH OIL) 1200 MG CAPS Take 1 capsule by mouth daily.    [provider]  promethazine-dextromethorphan (PROMETHAZINE-DM) 6.25-15 MG/5ML syrup Take 5 mLs by mouth 4 (four) times daily as needed. 03/28/22   Laurene Footman B, PA-C  rosuvastatin (CRESTOR) 10 MG tablet TAKE 1 TABLET AT BEDTIME 04/24/22   [place], Devonne Doughty, DO  venlafaxine XR (EFFEXOR-XR) 150 MG 24 hr capsule TAKE 1 CAPSULE (150 MG TOTAL) BY MOUTH DAILY WITH BREAKFAST. DOSE CHANGE FROM '75MG'$  UP TO '150MG'$ . 04/28/22   Olin Hauser, DO    Family History Family History  Problem Relation Age of Onset   Hyperlipidemia Mother    Obesity Mother    Pulmonary fibrosis Mother    Cancer Maternal Aunt        ovarian cancer   Cancer Father    Prostate cancer Father    Colon cancer Paternal Grandmother    Esophageal cancer Neg Hx    Stomach cancer Neg Hx    Rectal cancer Neg Hx    Breast cancer Neg Hx    Colon polyps Neg Hx     Social History Social History   Tobacco Use   Smoking status: Former    Packs/day: 0.25    Years: 5.00    Total pack years: 1.25    Types: Cigarettes    Quit date: 07/25/2001    Years since quitting: 20.9   Smokeless tobacco: Never  Vaping Use   Vaping Use: Never used  Substance Use Topics   Alcohol use: Yes    Alcohol/week: 0.0 standard drinks of alcohol    Comment: 2 glasses of wine per week   Drug use: No     Allergies   Patient has no known allergies.   Review of Systems Review of Systems  Constitutional:  Positive for fever. Negative for chills, diaphoresis and fatigue.  HENT:  Positive for congestion and sore throat. Negative for ear pain, rhinorrhea, sinus pressure and sinus pain.   Respiratory:  Positive for cough. Negative for shortness of breath.   Cardiovascular:   Negative for chest pain.  Gastrointestinal:  Negative for abdominal pain, nausea and vomiting.  Musculoskeletal:  Negative for arthralgias and myalgias.  Skin:  Negative for rash.  Neurological:  Negative for weakness and headaches.  Hematological:  Negative for adenopathy.     Physical Exam Triage Vital Signs ED Triage Vitals  Enc Vitals Group     BP 06/15/22 0904 (!) 115/90     Pulse Rate 06/15/22 0904 98     Resp 06/15/22 0904 16     Temp 06/15/22 0904 98.7 F (37.1  C)     Temp Source 06/15/22 0904 Oral     SpO2 06/15/22 0904 98 %     Weight --      Height --      Head Circumference --      Peak Flow --      Pain Score 06/15/22 0859 0     Pain Loc --      Pain Edu? --      Excl. in [place]? --    No data found.  Updated Vital Signs BP (!) 115/90 (BP Location: Left Wrist)   Pulse 98   Temp 98.7 F (37.1 C) (Oral)   Resp 16   SpO2 98%   Physical Exam Vitals and nursing note reviewed.  Constitutional:      General: She is not in acute distress.    Appearance: Normal appearance. She is not ill-appearing or toxic-appearing.  HENT:     Head: Normocephalic and atraumatic.     Nose: Congestion present.     Mouth/Throat:     Mouth: Mucous membranes are moist.     Pharynx: Oropharynx is clear. Posterior oropharyngeal erythema present.  Eyes:     General: No scleral icterus.       Right eye: No discharge.        Left eye: No discharge.     Conjunctiva/sclera: Conjunctivae normal.  Cardiovascular:     Rate and Rhythm: Normal rate and regular rhythm.     Heart sounds: Normal heart sounds.  Pulmonary:     Effort: Pulmonary effort is normal. No respiratory distress.     Breath sounds: Normal breath sounds.  Musculoskeletal:     Cervical back: Neck supple.  Skin:    General: Skin is dry.  Neurological:     General: No focal deficit present.     Mental Status: She is alert. Mental status is at baseline.     Motor: No weakness.     Gait: Gait normal.  Psychiatric:         Mood and Affect: Mood normal.        Behavior: Behavior normal.        Thought Content: Thought content normal.      UC Treatments / Results  Labs (all labs ordered are listed, but only abnormal results are displayed) Labs Reviewed  SARS CORONAVIRUS 2 BY RT PCR    EKG   Radiology No results found.  Procedures Procedures (including critical care time)  Medications Ordered in UC Medications - No data to display  Initial Impression / Assessment and Plan / UC Course  I have reviewed the triage vital signs and the nursing notes.  Pertinent labs & imaging results that were available during my care of the patient were reviewed by me and considered in my medical decision making (see chart for details).   66 year old female presenting for low-grade fever, sore throat and cough that started yesterday.  Exposed to [place] the day before.  Vitals are stable.  She is overall well-appearing and has mild nasal congestion and erythema of posterior pharynx on exam.  Chest clear auscultation and heart regular rate rhythm.  PCR COVID test obtained.  Patient does not want to stay and wait for the results.  Advised if the COVID is negative we should perform a strep test.  She says that she is not worried about strep and will return if the sore throat gets worse and is not due to COVID.  Advised patient I will contact  her if the COVID test is positive and send antiviral medicine.  Reviewed current CDC guidelines, isolation protocol and ED precautions if positive.  Encouraged use of OTC meds as needed, rest and fluids.  Negative COVID test. Suspect other viral URI.   Final Clinical Impressions(s) / UC Diagnoses   Final diagnoses:  Upper respiratory tract infection, unspecified type  Sore throat  Acute cough     Discharge Instructions      -We will call if the COVID test is positive and I can send antiviral medicine to the pharmacy for you. - If you are positive you will need to  isolate 5 days and wear a mask for 5 days.  Today would be the first day. - You should increase your rest and fluids.  May take over-the-counter Robitussin, throat lozenges, Chloraseptic spray, Tylenol. - Follow-up as needed especially if you have a return and/or worsening of the fever, worsening cough, shortness of breath.     ED Prescriptions   None    PDMP not reviewed this encounter.   Danton Clap, PA-C 06/15/22 1008

## 2022-06-22 ENCOUNTER — Other Ambulatory Visit: Payer: Self-pay

## 2022-06-22 DIAGNOSIS — N182 Chronic kidney disease, stage 2 (mild): Secondary | ICD-10-CM

## 2022-06-22 DIAGNOSIS — E538 Deficiency of other specified B group vitamins: Secondary | ICD-10-CM

## 2022-06-22 DIAGNOSIS — Z0000 Encounter for general adult medical examination without abnormal findings: Secondary | ICD-10-CM

## 2022-06-22 DIAGNOSIS — R7309 Other abnormal glucose: Secondary | ICD-10-CM

## 2022-06-22 DIAGNOSIS — E7800 Pure hypercholesterolemia, unspecified: Secondary | ICD-10-CM

## 2022-06-22 DIAGNOSIS — E559 Vitamin D deficiency, unspecified: Secondary | ICD-10-CM

## 2022-06-22 DIAGNOSIS — E669 Obesity, unspecified: Secondary | ICD-10-CM

## 2022-06-23 ENCOUNTER — Ambulatory Visit (INDEPENDENT_AMBULATORY_CARE_PROVIDER_SITE_OTHER): Payer: Medicare HMO

## 2022-06-23 ENCOUNTER — Other Ambulatory Visit: Payer: Medicare HMO

## 2022-06-23 VITALS — Ht 62.0 in | Wt 192.0 lb

## 2022-06-23 DIAGNOSIS — E7800 Pure hypercholesterolemia, unspecified: Secondary | ICD-10-CM | POA: Diagnosis not present

## 2022-06-23 DIAGNOSIS — Z0000 Encounter for general adult medical examination without abnormal findings: Secondary | ICD-10-CM | POA: Diagnosis not present

## 2022-06-23 DIAGNOSIS — R7309 Other abnormal glucose: Secondary | ICD-10-CM | POA: Diagnosis not present

## 2022-06-23 DIAGNOSIS — Z780 Asymptomatic menopausal state: Secondary | ICD-10-CM

## 2022-06-23 DIAGNOSIS — E559 Vitamin D deficiency, unspecified: Secondary | ICD-10-CM | POA: Diagnosis not present

## 2022-06-23 DIAGNOSIS — E538 Deficiency of other specified B group vitamins: Secondary | ICD-10-CM | POA: Diagnosis not present

## 2022-06-23 DIAGNOSIS — N182 Chronic kidney disease, stage 2 (mild): Secondary | ICD-10-CM | POA: Diagnosis not present

## 2022-06-23 DIAGNOSIS — E669 Obesity, unspecified: Secondary | ICD-10-CM | POA: Diagnosis not present

## 2022-06-23 NOTE — Patient Instructions (Signed)
[name] , Thank you for taking time to come for your Medicare Wellness Visit. I appreciate your ongoing commitment to your health goals. Please review the following plan we discussed and let me know if I can assist you in the future.   Screening recommendations/referrals: Colonoscopy: 03/16/21 Mammogram: 03/16/22 Bone Density: referral sent Recommended yearly ophthalmology/optometry visit for glaucoma screening and checkup Recommended yearly dental visit for hygiene and checkup  Vaccinations: Influenza vaccine: 06/23/21 Pneumococcal vaccine: n/d Tdap vaccine: 04/28/11, due if have injury Shingles vaccine:   Shingrix 07/11/21, 10/18/21 Covid-19:12/09/19, 12/31/19, 07/19/20, 01/21/21  Advanced directives: no  Conditions/risks identified: none  Next appointment: Follow up in one year for your annual wellness visit 06/29/23 @ 1:30 pm by phone   Preventive Care 65 Years and Older, Female Preventive care refers to lifestyle choices and visits with your health care provider that can promote health and wellness. What does preventive care include? A yearly physical exam. This is also called an annual well check. Dental exams once or twice a year. Routine eye exams. Ask your health care provider how often you should have your eyes checked. Personal lifestyle choices, including: Daily care of your teeth and gums. Regular physical activity. Eating a healthy diet. Avoiding tobacco and drug use. Limiting alcohol use. Practicing safe sex. Taking low-dose aspirin every day. Taking vitamin and mineral supplements as recommended by your health care provider. What happens during an annual well check? The services and screenings done by your health care provider during your annual well check will depend on your age, overall health, lifestyle risk factors, and family history of disease. Counseling  Your health care provider may ask you questions about your: Alcohol use. Tobacco use. Drug use. Emotional  well-being. Home and relationship well-being. Sexual activity. Eating habits. History of falls. Memory and ability to understand (cognition). Work and work Statistician. Reproductive health. Screening  You may have the following tests or measurements: Height, weight, and BMI. Blood pressure. Lipid and cholesterol levels. These may be checked every 5 years, or more frequently if you are over 86 years old. Skin check. Lung cancer screening. You may have this screening every year starting at age 33 if you have a 30-pack-year history of smoking and currently smoke or have quit within the past 15 years. Fecal occult blood test (FOBT) of the stool. You may have this test every year starting at age 27. Flexible sigmoidoscopy or colonoscopy. You may have a sigmoidoscopy every 5 years or a colonoscopy every 10 years starting at age 25. Hepatitis C blood test. Hepatitis B blood test. Sexually transmitted disease (STD) testing. Diabetes screening. This is done by checking your blood sugar (glucose) after you have not eaten for a while (fasting). You may have this done every 1-3 years. Bone density scan. This is done to screen for osteoporosis. You may have this done starting at age 101. Mammogram. This may be done every 1-2 years. Talk to your health care provider about how often you should have regular mammograms. Talk with your health care provider about your test results, treatment options, and if necessary, the need for more tests. Vaccines  Your health care provider may recommend certain vaccines, such as: Influenza vaccine. This is recommended every year. Tetanus, diphtheria, and acellular pertussis (Tdap, Td) vaccine. You may need a Td booster every 10 years. Zoster vaccine. You may need this after age 71. Pneumococcal 13-valent conjugate (PCV13) vaccine. One dose is recommended after age 50. Pneumococcal polysaccharide (PPSV23) vaccine. One dose is recommended  after age 32. Talk to your  health care provider about which screenings and vaccines you need and how often you need them. This information is not intended to replace advice given to you by your health care provider. Make sure you discuss any questions you have with your health care provider. Document Released: 10/22/2015 Document Revised: 06/14/2016 Document Reviewed: 07/27/2015 Elsevier Interactive Patient Education  2017 [place] Prevention in the Home Falls can cause injuries. They can happen to people of all ages. There are many things you can do to make your home safe and to help prevent falls. What can I do on the outside of my home? Regularly fix the edges of walkways and driveways and fix any cracks. Remove anything that might make you trip as you walk through a door, such as a raised step or threshold. Trim any bushes or trees on the path to your home. Use bright outdoor lighting. Clear any walking paths of anything that might make someone trip, such as rocks or tools. Regularly check to see if handrails are loose or broken. Make sure that both sides of any steps have handrails. Any raised decks and porches should have guardrails on the edges. Have any leaves, snow, or ice cleared regularly. Use sand or salt on walking paths during winter. Clean up any spills in your garage right away. This includes oil or grease spills. What can I do in the bathroom? Use night lights. Install grab bars by the toilet and in the tub and shower. Do not use towel bars as grab bars. Use non-skid mats or decals in the tub or shower. If you need to sit down in the shower, use a plastic, non-slip stool. Keep the floor dry. Clean up any water that spills on the floor as soon as it happens. Remove soap buildup in the tub or shower regularly. Attach bath mats securely with double-sided non-slip rug tape. Do not have throw rugs and other things on the floor that can make you trip. What can I do in the bedroom? Use night  lights. Make sure that you have a light by your bed that is easy to reach. Do not use any sheets or blankets that are too big for your bed. They should not hang down onto the floor. Have a firm chair that has side arms. You can use this for support while you get dressed. Do not have throw rugs and other things on the floor that can make you trip. What can I do in the kitchen? Clean up any spills right away. Avoid walking on wet floors. Keep items that you use a lot in easy-to-reach places. If you need to reach something above you, use a strong step stool that has a grab bar. Keep electrical cords out of the way. Do not use floor polish or wax that makes floors slippery. If you must use wax, use non-skid floor wax. Do not have throw rugs and other things on the floor that can make you trip. What can I do with my stairs? Do not leave any items on the stairs. Make sure that there are handrails on both sides of the stairs and use them. Fix handrails that are broken or loose. Make sure that handrails are as long as the stairways. Check any carpeting to make sure that it is firmly attached to the stairs. Fix any carpet that is loose or worn. Avoid having throw rugs at the top or bottom of the stairs. If you  do have throw rugs, attach them to the floor with carpet tape. Make sure that you have a light switch at the top of the stairs and the bottom of the stairs. If you do not have them, ask someone to add them for you. What else can I do to help prevent falls? Wear shoes that: Do not have high heels. Have rubber bottoms. Are comfortable and fit you well. Are closed at the toe. Do not wear sandals. If you use a stepladder: Make sure that it is fully opened. Do not climb a closed stepladder. Make sure that both sides of the stepladder are locked into place. Ask someone to hold it for you, if possible. Clearly mark and make sure that you can see: Any grab bars or handrails. First and last  steps. Where the edge of each step is. Use tools that help you move around (mobility aids) if they are needed. These include: Canes. Walkers. Scooters. Crutches. Turn on the lights when you go into a dark area. Replace any light bulbs as soon as they burn out. Set up your furniture so you have a clear path. Avoid moving your furniture around. If any of your floors are uneven, fix them. If there are any pets around you, be aware of where they are. Review your medicines with your doctor. Some medicines can make you feel dizzy. This can increase your chance of falling. Ask your doctor what other things that you can do to help prevent falls. This information is not intended to replace advice given to you by your health care provider. Make sure you discuss any questions you have with your health care provider. Document Released: 07/22/2009 Document Revised: 03/02/2016 Document Reviewed: 10/30/2014 Elsevier Interactive Patient Education  2017 [employer].

## 2022-06-23 NOTE — Progress Notes (Signed)
Virtual Visit via Telephone Note  I connected with  [name] on 06/23/22 at  3:15 PM EDT by telephone and verified that I am speaking with the correct person using two identifiers.  Location: Patient: home Provider: [facility] Persons participating in the virtual visit: [place]   I discussed the limitations, risks, security and privacy concerns of performing an evaluation and management service by telephone and the availability of in person appointments. The patient expressed understanding and agreed to proceed.  Interactive audio and video telecommunications were attempted between this nurse and patient, however failed, due to patient having technical difficulties OR patient did not have access to video capability.  We continued and completed visit with audio only.  Some vital signs may be absent or patient reported.   Dionisio David, LPN  Subjective:   [name] is a 66 y.o. female who presents for Medicare Annual (Subsequent) preventive examination.  Review of Systems     Cardiac Risk Factors include: advanced age (>51mn, >>32women);dyslipidemia     Objective:    There were no vitals filed for this visit. There is no height or weight on file to calculate BMI.     06/23/2022    3:16 PM 06/15/2022    9:01 AM 04/24/2022    5:36 PM 04/24/2022    2:43 PM 04/24/2022   10:35 AM 04/19/2021    1:21 PM 09/03/2020    9:23 AM  Advanced Directives  Does Patient Have a Medical Advance Directive? No No No No No No No  Would patient like information on creating a medical advance directive? No - Patient declined  No - Patient declined No - Patient declined       Current Medications (verified) Outpatient Encounter Medications as of 06/23/2022  Medication Sig   B Complex Vitamins (VITAMIN-B COMPLEX PO) Take by mouth daily.   chlorhexidine (PERIDEX) 0.12 % solution SMARTSIG:By Mouth   lamoTRIgine (LAMICTAL) 100 MG tablet Take 1 tablet (100 mg total)  by mouth 2 (two) times daily.   levETIRAcetam (KEPPRA) 750 MG tablet Take by mouth.   Melatonin 3 MG TABS Take 6 mg by mouth at bedtime.    Multiple Vitamin (MULTIVITAMIN WITH MINERALS) TABS Take 1 tablet by mouth daily.    Omega-3 Fatty Acids (FISH OIL) 1200 MG CAPS Take 1 capsule by mouth daily.   rosuvastatin (CRESTOR) 10 MG tablet TAKE 1 TABLET AT BEDTIME   venlafaxine XR (EFFEXOR-XR) 150 MG 24 hr capsule TAKE 1 CAPSULE (150 MG TOTAL) BY MOUTH DAILY WITH BREAKFAST. DOSE CHANGE FROM '75MG'$  UP TO '150MG'$ .   amoxicillin (AMOXIL) 500 MG capsule SMARTSIG:4 Capsule(s) By Mouth Once (Patient not taking: Reported on 06/23/2022)   amoxicillin-clavulanate (AUGMENTIN) 500-125 MG tablet Take 1 tablet by mouth 3 (three) times daily. (Patient not taking: Reported on 06/23/2022)   methylPREDNISolone (MEDROL DOSEPAK) 4 MG TBPK tablet Take by mouth as directed. (Patient not taking: Reported on 06/23/2022)   [DISCONTINUED] HYDROcodone-acetaminophen (NORCO/VICODIN) 5-325 MG tablet Take 1-2 tablets by mouth every 4 (four) hours as needed for moderate pain.   [DISCONTINUED] lidocaine (XYLOCAINE) 2 % solution Use as directed 15 mLs in the mouth or throat every 3 (three) hours as needed for mouth pain (swish and spit).   [DISCONTINUED] melatonin 3 MG TABS tablet Take by mouth.   [DISCONTINUED] promethazine-dextromethorphan (PROMETHAZINE-DM) 6.25-15 MG/5ML syrup Take 5 mLs by mouth 4 (four) times daily as needed.   No facility-administered encounter medications on file as of 06/23/2022.  Allergies (verified) Patient has no known allergies.   History: Past Medical History:  Diagnosis Date   Allergy    Anxiety    Arthritis    left hip   Blood transfusion without reported diagnosis    with hip surgery    GERD (gastroesophageal reflux disease)    Hepatitis 1972   hep B   Hyperlipidemia    controlled on meds    Memory disorder 09/21/2017   OSA (obstructive sleep apnea) 05/08/2018   PONV (postoperative nausea  and vomiting)    Seizures ([place])    last seizure 11-2016   Sleep apnea    no cpap    Stroke ([place])    per pt    Past Surgical History:  Procedure Laterality Date   ABDOMINAL HYSTERECTOMY  04/1999   ARTHROSCOPIC REPAIR ACL     bilateral knees   BREAST EXCISIONAL BIOPSY Right 2008   NEG   BREAST EXCISIONAL BIOPSY Left YRS AGO   X2 - NEG   BREAST LUMPECTOMY  06/1979; 03/1995   left   CHOLECYSTECTOMY  09/1987   COLONOSCOPY     LAPAROSCOPIC GASTRIC SLEEVE RESECTION  09/30/2012   Procedure: LAPAROSCOPIC GASTRIC SLEEVE RESECTION;  Surgeon: Madilyn Hook, DO;  Location: [facility];  Service: General;  Laterality: N/A;   POLYPECTOMY     TOTAL HIP ARTHROPLASTY  11/13/2012   left   UPPER GASTROINTESTINAL ENDOSCOPY     UPPER GI ENDOSCOPY  09/30/2012   Procedure: UPPER GI ENDOSCOPY;  Surgeon: Madilyn Hook, DO;  Location: [facility];  Service: General;;   XI ROBOTIC LAPAROSCOPIC ASSISTED APPENDECTOMY N/A 04/24/2022   Procedure: XI ROBOTIC LAPAROSCOPIC ASSISTED APPENDECTOMY;  Surgeon: Herbert Pun, MD;  Location: [facility] ORS;  Service: General;  Laterality: N/A;   Family History  Problem Relation Age of Onset   Hyperlipidemia Mother    Obesity Mother    Pulmonary fibrosis Mother    Cancer Maternal Aunt        ovarian cancer   Cancer Father    Prostate cancer Father    Colon cancer Paternal Grandmother    Esophageal cancer Neg Hx    Stomach cancer Neg Hx    Rectal cancer Neg Hx    Breast cancer Neg Hx    Colon polyps Neg Hx    Social History   Socioeconomic History   Marital status: Married    Spouse name: Not on file   Number of children: 0   Years of education: BS   Highest education level: Not on file  Occupational History   Not on file  Tobacco Use   Smoking status: Former    Packs/day: 0.25    Years: 5.00    Total pack years: 1.25    Types: Cigarettes    Quit date: 07/25/2001    Years since quitting: 20.9   Smokeless tobacco: Never  Vaping Use   Vaping Use: Never used   Substance and Sexual Activity   Alcohol use: Yes    Alcohol/week: 0.0 standard drinks of alcohol    Comment: 2 glasses of wine per week   Drug use: No   Sexual activity: Not on file  Other Topics Concern   Not on file  Social History Narrative   Lives with spouse   Caffeine use: Drinks coffee daily   Right handed   Social Determinants of Health   Financial Resource Strain: Low Risk  (06/23/2022)   Overall Financial Resource Strain (CARDIA)    Difficulty of Paying Living Expenses:  Not very hard  Food Insecurity: No Food Insecurity (06/23/2022)   Hunger Vital Sign    Worried About Running Out of Food in the Last Year: Never true    Ran Out of Food in the Last Year: Never true  Transportation Needs: No Transportation Needs (06/23/2022)   PRAPARE - Hydrologist (Medical): No    Lack of Transportation (Non-Medical): No  Physical Activity: Sufficiently Active (06/23/2022)   Exercise Vital Sign    Days of Exercise per Week: 4 days    Minutes of Exercise per Session: 40 min  Stress: No Stress Concern Present (06/23/2022)   [place]    Feeling of Stress : Only a little  Social Connections: Moderately Integrated (06/23/2022)   Social Connection and Isolation Panel [NHANES]    Frequency of Communication with Friends and Family: More than three times a week    Frequency of Social Gatherings with Friends and Family: Once a week    Attends Religious Services: More than 4 times per year    Active Member of [employer] or Organizations: No    Attends Music therapist: Never    Marital Status: Married    Tobacco Counseling Counseling given: Not Answered   Clinical Intake:  Pre-visit preparation completed: Yes  Pain : No/denies pain     Nutritional Risks: None Diabetes: No  How often do you need to have someone help you when you read instructions, pamphlets, or other written  materials from your doctor or pharmacy?: 1 - Never  Diabetic?no  Interpreter Needed?: No  Information entered by :: Kirke Shaggy, LPN   Activities of Daily Living    06/23/2022    3:17 PM 04/24/2022    5:39 PM  In your present state of health, do you have any difficulty performing the following activities:  Hearing? 0   Vision? 0   Difficulty concentrating or making decisions? 0   Walking or climbing stairs? 0   Dressing or bathing? 0   Doing errands, shopping? 0 0  Preparing Food and eating ? N   Using the Toilet? N   In the past six months, have you accidently leaked urine? N   Do you have problems with loss of bowel control? N   Managing your Medications? N   Managing your Finances? N   Housekeeping or managing your Housekeeping? N     Patient Care Team: Olin Hauser, DO as PCP - General (Family Medicine) Himmelrich, Bryson Ha, RD (Inactive) as Dietitian (Bariatrics)  Indicate any recent Medical Services you may have received from other than [facility] providers in the past year (date may be approximate).     Assessment:   This is a routine wellness examination for [name].  Hearing/Vision screen Hearing Screening - Comments:: No aids Vision Screening - Comments:: Wears glasses- [facility]  Dietary issues and exercise activities discussed: Current Exercise Habits: Home exercise routine, Type of exercise: Other - see comments (swimming), Time (Minutes): 35, Frequency (Times/Week): 4, Weekly Exercise (Minutes/Week): 140, Intensity: Mild   Goals Addressed             This Visit's Progress    DIET - EAT MORE FRUITS AND VEGETABLES         Depression Screen    06/23/2022    3:14 PM 12/21/2021   10:34 AM 06/23/2021    9:45 AM 04/19/2021    1:22 PM 03/08/2021    2:50 PM 08/19/2020  11:40 AM 06/21/2020   10:12 AM  PHQ 2/9 Scores  PHQ - 2 Score 1 0 '2 2 4 '$ 0 2  PHQ- 9 Score '3 1 4 9 11 '$ 0 8    Fall Risk    06/23/2022    3:17 PM 12/21/2021   10:34 AM  06/23/2021    9:45 AM 04/19/2021    1:21 PM 03/08/2021    2:49 PM  [place] in the past year? 0 0 0 0 0  Number falls in past yr: 0 0 0  0  Injury with Fall? 0 0 0  0  Risk for fall due to : No Fall Risks No Fall Risks  Medication side effect   Follow up Falls prevention discussed Falls evaluation completed Falls evaluation completed Falls evaluation completed;Education provided;Falls prevention discussed Falls evaluation completed    FALL RISK PREVENTION PERTAINING TO THE HOME:  Any stairs in or around the home? Yes  If so, are there any without handrails? No  Home free of loose throw rugs in walkways, pet beds, electrical cords, etc? Yes  Adequate lighting in your home to reduce risk of falls? Yes   ASSISTIVE DEVICES UTILIZED TO PREVENT FALLS:  Life alert? No  Use of a cane, walker or w/c? No  Grab bars in the bathroom? Yes  Shower chair or bench in shower? Yes  Elevated toilet seat or a handicapped toilet? Yes    Cognitive Function:        06/23/2022    3:18 PM 04/19/2021    1:25 PM  6CIT Screen  What Year? 0 points 0 points  What month? 0 points 0 points  What time? 0 points 0 points  Count back from 20 0 points 0 points  Months in reverse 0 points 0 points  Repeat phrase 0 points 4 points  Total Score 0 points 4 points    Immunizations Immunization History  Administered Date(s) Administered   Fluad Quad(high Dose 65+) 06/23/2021   Influenza Inj Mdck Quad Pf 07/29/2018   Influenza Split 06/20/2012   Influenza,inj,Quad PF,6+ Mos 06/20/2019, 06/21/2020   Influenza-Unspecified 10/17/2016, 07/31/2017, 07/29/2018, 06/09/2022   PFIZER(Purple Top)SARS-COV-2 Vaccination 12/09/2019, 12/31/2019, 07/19/2020, 01/21/2021   Td 04/28/2011   Zoster Recombinat (Shingrix) 07/11/2021, 10/18/2021    TDAP status: Due, Education has been provided regarding the importance of this vaccine. Advised may receive this vaccine at local pharmacy or Health Dept. Aware to provide a  copy of the vaccination record if obtained from local pharmacy or Health Dept. Verbalized acceptance and understanding.  Flu Vaccine status: Up to date  Pneumococcal vaccine status: Due, Education has been provided regarding the importance of this vaccine. Advised may receive this vaccine at local pharmacy or Health Dept. Aware to provide a copy of the vaccination record if obtained from local pharmacy or Health Dept. Verbalized acceptance and understanding.  Covid-19 vaccine status: Completed vaccines  Qualifies for Shingles Vaccine? Yes   Zostavax completed No   Shingrix Completed?: Yes  Screening Tests Health Maintenance  Topic Date Due   COVID-19 Vaccine (5 - Pfizer series) 03/18/2021   DEXA SCAN  Never done   Pneumonia Vaccine 62+ Years old (1 - PCV) 05/31/2022   TETANUS/TDAP  06/23/2024 (Originally 04/27/2021)   MAMMOGRAM  03/16/2024   COLONOSCOPY (Pts 45-66yr Insurance coverage will need to be confirmed)  03/16/2028   INFLUENZA VACCINE  Completed   Hepatitis C Screening  Completed   HIV Screening  Completed   Zoster Vaccines- Shingrix  Completed   HPV VACCINES  Aged Out   PAP SMEAR-Modifier  Discontinued    Health Maintenance  Health Maintenance Due  Topic Date Due   COVID-19 Vaccine (5 - Pfizer series) 03/18/2021   DEXA SCAN  Never done   Pneumonia Vaccine 67+ Years old (1 - PCV) 05/31/2022    Colorectal cancer screening: Type of screening: Colonoscopy. Completed 03/16/21. Repeat every 7 years  Mammogram status: Completed 03/16/22. Repeat every year  Bone Density status: Ordered today. Pt provided with contact info and advised to call to schedule appt.  Lung Cancer Screening: (Low Dose CT Chest recommended if Age 58-80 years, 30 pack-year currently smoking OR have quit w/in 15years.) does not qualify.   Additional Screening:  Hepatitis C Screening: does qualify; Completed 06/18/19  Vision Screening: Recommended annual ophthalmology exams for early detection of  glaucoma and other disorders of the eye. Is the patient up to date with their annual eye exam?  Yes  Who is the provider or what is the name of the office in which the patient attends annual eye exams? [facility] If pt is not established with a provider, would they like to be referred to a provider to establish care? No .   Dental Screening: Recommended annual dental exams for proper oral hygiene  Community Resource Referral / Chronic Care Management: CRR required this visit?  No   CCM required this visit?  No      Plan:     I have personally reviewed and noted the following in the patient's chart:   Medical and social history Use of alcohol, tobacco or illicit drugs  Current medications and supplements including opioid prescriptions. Patient is not currently taking opioid prescriptions. Functional ability and status Nutritional status Physical activity Advanced directives List of other physicians Hospitalizations, surgeries, and ER visits in previous 12 months Vitals Screenings to include cognitive, depression, and falls Referrals and appointments  In addition, I have reviewed and discussed with patient certain preventive protocols, quality metrics, and best practice recommendations. A written personalized care plan for preventive services as well as general preventive health recommendations were provided to patient.     Dionisio David, LPN   02/08/7740   Nurse Notes: none

## 2022-06-24 LAB — COMPLETE METABOLIC PANEL WITH GFR
AG Ratio: 1.8 (calc) (ref 1.0–2.5)
ALT: 12 U/L (ref 6–29)
AST: 13 U/L (ref 10–35)
Albumin: 3.8 g/dL (ref 3.6–5.1)
Alkaline phosphatase (APISO): 79 U/L (ref 37–153)
BUN: 20 mg/dL (ref 7–25)
CO2: 30 mmol/L (ref 20–32)
Calcium: 8.7 mg/dL (ref 8.6–10.4)
Chloride: 107 mmol/L (ref 98–110)
Creat: 1.03 mg/dL (ref 0.50–1.05)
Globulin: 2.1 g/dL (calc) (ref 1.9–3.7)
Glucose, Bld: 79 mg/dL (ref 65–99)
Potassium: 4 mmol/L (ref 3.5–5.3)
Sodium: 143 mmol/L (ref 135–146)
Total Bilirubin: 0.5 mg/dL (ref 0.2–1.2)
Total Protein: 5.9 g/dL — ABNORMAL LOW (ref 6.1–8.1)
eGFR: 60 mL/min/{1.73_m2} (ref >=60)

## 2022-06-24 LAB — CBC WITH DIFFERENTIAL/PLATELET
Absolute Monocytes: 710 cells/uL (ref 200–950)
Basophils Absolute: 86 cells/uL (ref 0–200)
Basophils Relative: 0.9 %
Eosinophils Absolute: 672 cells/uL — ABNORMAL HIGH (ref 15–500)
Eosinophils Relative: 7 %
HCT: 38.3 % (ref 35.0–45.0)
Hemoglobin: 12.7 g/dL (ref 11.7–15.5)
Lymphs Abs: 3773 cells/uL (ref 850–3900)
MCH: 32.2 pg (ref 27.0–33.0)
MCHC: 33.2 g/dL (ref 32.0–36.0)
MCV: 97.2 fL (ref 80.0–100.0)
MPV: 9.1 fL (ref 7.5–12.5)
Monocytes Relative: 7.4 %
Neutro Abs: 4358 cells/uL (ref 1500–7800)
Neutrophils Relative %: 45.4 %
Platelets: 331 10*3/uL (ref 140–400)
RBC: 3.94 10*6/uL (ref 3.80–5.10)
RDW: 12.1 % (ref 11.0–15.0)
Total Lymphocyte: 39.3 %
WBC: 9.6 10*3/uL (ref 3.8–10.8)

## 2022-06-24 LAB — LIPID PANEL
Cholesterol: 168 mg/dL (ref <200)
HDL: 74 mg/dL (ref >=50)
LDL Cholesterol (Calc): 74 mg/dL (calc)
Non-HDL Cholesterol (Calc): 94 mg/dL (calc) (ref <130)
Total CHOL/HDL Ratio: 2.3 (calc) (ref <5.0)
Triglycerides: 114 mg/dL (ref <150)

## 2022-06-24 LAB — HEMOGLOBIN A1C
Hgb A1c MFr Bld: 5 % of total Hgb (ref <5.7)
Mean Plasma Glucose: 97 mg/dL
eAG (mmol/L): 5.4 mmol/L

## 2022-06-24 LAB — VITAMIN D 25 HYDROXY (VIT D DEFICIENCY, FRACTURES): Vit D, 25-Hydroxy: 52 ng/mL (ref 30–100)

## 2022-06-24 LAB — VITAMIN B12: Vitamin B-12: 475 pg/mL (ref 200–1100)

## 2022-06-24 LAB — TSH: TSH: 2.71 mIU/L (ref 0.40–4.50)

## 2022-06-25 ENCOUNTER — Ambulatory Visit
Admission: EM | Admit: 2022-06-25 | Discharge: 2022-06-25 | Disposition: A | Discharge status: Home / Self Care | Payer: Medicare HMO | Attending: Physician Assistant | Admitting: Physician Assistant

## 2022-06-25 DIAGNOSIS — L282 Other prurigo: Secondary | ICD-10-CM | POA: Diagnosis not present

## 2022-06-25 DIAGNOSIS — L259 Unspecified contact dermatitis, unspecified cause: Secondary | ICD-10-CM

## 2022-06-25 DIAGNOSIS — W57XXXA Bitten or stung by nonvenomous insect and other nonvenomous arthropods, initial encounter: Secondary | ICD-10-CM | POA: Diagnosis not present

## 2022-06-25 MED ORDER — METHYLPREDNISOLONE 4 MG PO TBPK: ORAL_TABLET | ORAL | 0 refills | Status: DC

## 2022-06-25 MED ORDER — METHYLPREDNISOLONE SODIUM SUCC 125 MG IJ SOLR
80.0000 mg | Freq: Once | INTRAMUSCULAR | Status: AC
  Administered 2022-06-25: 80 mg via INTRAMUSCULAR

## 2022-06-25 MED ORDER — TRIAMCINOLONE ACETONIDE 0.1 % EX OINT: 1.0000 | TOPICAL_OINTMENT | Freq: Two times a day (BID) | CUTANEOUS | 0 refills | Status: DC

## 2022-06-25 MED ORDER — HYDROXYZINE HCL 25 MG PO TABS: 25.0000 mg | ORAL_TABLET | Freq: Four times a day (QID) | ORAL | 0 refills | Status: DC | PRN

## 2022-06-25 NOTE — ED Provider Notes (Signed)
[facility]    CSN: [id number] Arrival date & time: 06/25/22  0258      History   Chief Complaint Chief Complaint  Patient presents with   Poison Ivy    HPI [name] is a 66 y.o. female presenting for pruritic rash on abdomen, back, posterior neck, arms and legs for the past 3 days.  Patient reports that she was working in her garden picking up sticks.  She says that she might have come into contact with poison ivy.  She also reports that at 1 point she saw a bunch of very tiny black insect crawling up her arms and she had to brush them off.  She also reports that she had a small tick on her stomach the other day that she pulled off.  Patient is unsure if her rash is due to poison ivy, bug bites or possibly tickborne disease.  She is denying any fevers and feels otherwise fine.  She has been treating with over-the-counter poison ivy cream.  Symptoms have not gotten any better.  No other complaints.  HPI  Past Medical History:  Diagnosis Date   Allergy    Anxiety    Arthritis    left hip   Blood transfusion without reported diagnosis    with hip surgery    GERD (gastroesophageal reflux disease)    Hepatitis 1972   hep B   Hyperlipidemia    controlled on meds    Memory disorder 09/21/2017   OSA (obstructive sleep apnea) 05/08/2018   PONV (postoperative nausea and vomiting)    Seizures ([place])    last seizure 11-2016   Sleep apnea    no cpap    Stroke [facility])    per pt     Patient Active Problem List   Diagnosis Date Noted   Acute appendicitis with localized peritonitis 04/24/2022   Insomnia 05/05/2020   CKD (chronic kidney disease), stage II 06/20/2019   History of hemorrhagic cerebrovascular accident (CVA) with residual deficit 05/23/2018   OSA (obstructive sleep apnea) 05/08/2018   Aphasia due to and not concurrent with nontraumatic subarachnoid hemorrhage 10/25/2017   Memory disorder 09/21/2017   Seizures ([place]) 04/09/2017   Aneurysm, carotid  artery, internal 01/08/2017   Stroke ([place]) 12/22/2016   Leukocytosis 12/12/2016   Cerebral vasospasm 12/08/2016   Hypotension 12/08/2016   Hypertension 12/03/2016   Screening for breast cancer 05/21/2015   Special screening for malignant neoplasms, colon 01/13/2013   Hyperlipidemia 12/19/2012   Obesity (BMI 35.0-39.9 without comorbidity) 09/23/2012   Recurrent major depression in partial remission ([place]) 07/26/2011    Past Surgical History:  Procedure Laterality Date   ABDOMINAL HYSTERECTOMY  04/1999   ARTHROSCOPIC REPAIR ACL     bilateral knees   BREAST EXCISIONAL BIOPSY Right 2008   NEG   BREAST EXCISIONAL BIOPSY Left YRS AGO   X2 - NEG   BREAST LUMPECTOMY  06/1979; 03/1995   left   CHOLECYSTECTOMY  09/1987   COLONOSCOPY     LAPAROSCOPIC GASTRIC SLEEVE RESECTION  09/30/2012   Procedure: LAPAROSCOPIC GASTRIC SLEEVE RESECTION;  Surgeon: Madilyn Hook, DO;  Location: [facility];  Service: General;  Laterality: N/A;   POLYPECTOMY     TOTAL HIP ARTHROPLASTY  11/13/2012   left   UPPER GASTROINTESTINAL ENDOSCOPY     UPPER GI ENDOSCOPY  09/30/2012   Procedure: UPPER GI ENDOSCOPY;  Surgeon: Madilyn Hook, DO;  Location: [facility];  Service: General;;   XI ROBOTIC LAPAROSCOPIC ASSISTED APPENDECTOMY N/A 04/24/2022  Procedure: XI ROBOTIC LAPAROSCOPIC ASSISTED APPENDECTOMY;  Surgeon: Herbert Pun, MD;  Location: [facility] ORS;  Service: General;  Laterality: N/A;    OB History   No obstetric history on file.      Home Medications    Prior to Admission medications   Medication Sig Start Date End Date Taking? Authorizing Provider  hydrOXYzine (ATARAX) 25 MG tablet Take 1 tablet (25 mg total) by mouth every 6 (six) hours as needed for itching. 06/25/22  Yes Laurene Footman B, PA-C  methylPREDNISolone (MEDROL DOSEPAK) 4 MG TBPK tablet Take according to Dosepak instructions 06/25/22  Yes Laurene Footman B, PA-C  triamcinolone ointment (KENALOG) 0.1 % Apply 1 Application topically 2 (two) times  daily. 06/25/22  Yes Laurene Footman B, PA-C  B Complex Vitamins (VITAMIN-B COMPLEX PO) Take by mouth daily.    [provider]  chlorhexidine (PERIDEX) 0.12 % solution SMARTSIG:By Mouth 05/08/22   [provider]  lamoTRIgine (LAMICTAL) 100 MG tablet Take 1 tablet (100 mg total) by mouth 2 (two) times daily. 10/06/21   Suzzanne Cloud, NP  levETIRAcetam (KEPPRA) 750 MG tablet Take by mouth. 05/20/21   [provider]  Melatonin 3 MG TABS Take 6 mg by mouth at bedtime.     [provider]  Multiple Vitamin (MULTIVITAMIN WITH MINERALS) TABS Take 1 tablet by mouth daily.     [provider]  Omega-3 Fatty Acids (FISH OIL) 1200 MG CAPS Take 1 capsule by mouth daily.    [provider]  rosuvastatin (CRESTOR) 10 MG tablet TAKE 1 TABLET AT BEDTIME 04/24/22   Karamalegos, Alexander J, DO  venlafaxine XR (EFFEXOR-XR) 150 MG 24 hr capsule TAKE 1 CAPSULE (150 MG TOTAL) BY MOUTH DAILY WITH BREAKFAST. DOSE CHANGE FROM '75MG'$  UP TO '150MG'$ . 04/28/22   Olin Hauser, DO    Family History Family History  Problem Relation Age of Onset   Hyperlipidemia Mother    Obesity Mother    Pulmonary fibrosis Mother    Cancer Maternal Aunt        ovarian cancer   Cancer Father    Prostate cancer Father    Colon cancer Paternal Grandmother    Esophageal cancer Neg Hx    Stomach cancer Neg Hx    Rectal cancer Neg Hx    Breast cancer Neg Hx    Colon polyps Neg Hx     Social History Social History   Tobacco Use   Smoking status: Former    Packs/day: 0.25    Years: 5.00    Total pack years: 1.25    Types: Cigarettes    Quit date: 07/25/2001    Years since quitting: 20.9   Smokeless tobacco: Never  Vaping Use   Vaping Use: Never used  Substance Use Topics   Alcohol use: Yes    Alcohol/week: 0.0 standard drinks of alcohol    Comment: 2 glasses of wine per week   Drug use: No     Allergies   Patient has no known allergies.   Review of  Systems Review of Systems  Constitutional:  Negative for fatigue and fever.  Respiratory:  Negative for shortness of breath.   Cardiovascular:  Negative for chest pain.  Gastrointestinal:  Negative for nausea and vomiting.  Musculoskeletal:  Negative for arthralgias, joint swelling and myalgias.  Skin:  Positive for rash.  Neurological:  Negative for weakness and headaches.     Physical Exam Triage Vital Signs ED Triage Vitals  Enc Vitals Group  BP 06/25/22 1011 121/85     Pulse Rate 06/25/22 1011 81     Resp 06/25/22 1011 18     Temp 06/25/22 1011 98.9 F (37.2 C)     Temp Source 06/25/22 1011 Oral     SpO2 06/25/22 1011 98 %     Weight 06/25/22 1009 185 lb (83.9 kg)     Height 06/25/22 1009 '5\' 2"'$  (1.575 m)     Head Circumference --      Peak Flow --      Pain Score 06/25/22 1009 0     Pain Loc --      Pain Edu? --      Excl. in [place]? --    No data found.  Updated Vital Signs BP 121/85 (BP Location: Left Arm)   Pulse 81   Temp 98.9 F (37.2 C) (Oral)   Resp 18   Ht '5\' 2"'$  (1.575 m)   Wt 185 lb (83.9 kg)   SpO2 98%   BMI 33.84 kg/m   Physical Exam Vitals and nursing note reviewed.  Constitutional:      General: She is not in acute distress.    Appearance: Normal appearance. She is not ill-appearing or toxic-appearing.  HENT:     Head: Normocephalic and atraumatic.     Nose: Nose normal.     Mouth/Throat:     Mouth: Mucous membranes are moist.     Pharynx: Oropharynx is clear.  Eyes:     General: No scleral icterus.       Right eye: No discharge.        Left eye: No discharge.     Conjunctiva/sclera: Conjunctivae normal.  Cardiovascular:     Rate and Rhythm: Normal rate and regular rhythm.     Heart sounds: Normal heart sounds.  Pulmonary:     Effort: Pulmonary effort is normal. No respiratory distress.     Breath sounds: Normal breath sounds.  Musculoskeletal:     Cervical back: Neck supple.  Skin:    General: Skin is dry.     Findings: Rash  present.     Comments: Of the posterior neck, there are papules and vesicles in a linear pattern.  Of the abdomen and back there are numerous scattered vesicles and tiny papules.  The same rash appears on the arms and legs to a lesser degree.  Neurological:     General: No focal deficit present.     Mental Status: She is alert. Mental status is at baseline.     Motor: No weakness.     Gait: Gait normal.  Psychiatric:        Mood and Affect: Mood normal.        Behavior: Behavior normal.        Thought Content: Thought content normal.      UC Treatments / Results  Labs (all labs ordered are listed, but only abnormal results are displayed) Labs Reviewed - No data to display  EKG   Radiology No results found.  Procedures Procedures (including critical care time)  Medications Ordered in UC Medications  methylPREDNISolone sodium succinate (SOLU-MEDROL) 125 mg/2 mL injection 80 mg (has no administration in time range)    Initial Impression / Assessment and Plan / UC Course  I have reviewed the triage vital signs and the nursing notes.  Pertinent labs & imaging results that were available during my care of the patient were reviewed by me and considered in my medical decision making (see  chart for details).   66 year old female presenting for pruritic vesicular and papular rash for the past few days.  Patient reports coming into contact with poison ivy, multiple tiny black insects and a tick.  She has not had any fever, myalgias or weakness.  Feels otherwise fine and is mostly complaining of a lot of itching.  On exam, rash seems to be most consistent with insect bites.  May also be related to poison ivy.  We will treat at this time with corticosteroids.  Patient given 80 mg IM Solu-Medrol in clinic and send Medrol Dosepak for her to start tomorrow.  Sent triamcinolone ointment for her to apply to a few specific areas that are bothering her the most and also sent hydroxyzine.  Cool  compresses also recommended.  Advised to follow-up as needed especially if symptoms are worsening or if she is developing a fever.   Final Clinical Impressions(s) / UC Diagnoses   Final diagnoses:  Pruritic rash  Insect bite, unspecified site, initial encounter  Contact dermatitis, unspecified contact dermatitis type, unspecified trigger     Discharge Instructions      -You have been given a injection of a corticosteroid in clinic today. - Start the Medrol Dosepak tomorrow. - I have sent hydroxyzine for the itching. - You can also use the topical triamcinolone ointment in the most bothersome areas. - Cool compresses may also help. - Return if you develop a fever swelling or shortness of breath.  Also return for any worsening symptoms.     ED Prescriptions     Medication Sig Dispense Auth. Provider   methylPREDNISolone (MEDROL DOSEPAK) 4 MG TBPK tablet Take according to Dosepak instructions 21 tablet Laurene Footman B, PA-C   triamcinolone ointment (KENALOG) 0.1 % Apply 1 Application topically 2 (two) times daily. 30 g Laurene Footman B, PA-C   hydrOXYzine (ATARAX) 25 MG tablet Take 1 tablet (25 mg total) by mouth every 6 (six) hours as needed for itching. 30 tablet Gretta Cool      PDMP not reviewed this encounter.   Danton Clap, PA-C 06/25/22 1052

## 2022-06-25 NOTE — ED Triage Notes (Signed)
Pt c/o possible poison Ivy along the back of the neck, feet, abdomen, and forearms x3days  Pt states that she was picking up sticks outside and pulled out a small tick from her stomach.  Pt used a OTC poison Ivy ointment called Tecu.  Pt took her husbands Meloxicam on Friday night for knee pain - Pt states that she has taken this before.   Pt states that while picking up the sticks, she had "tiny dots" crawling on her arms. Pt states that she brushed them all off as she noticed them.

## 2022-06-25 NOTE — Discharge Instructions (Signed)
-  You have been given a injection of a corticosteroid in clinic today. - Start the Medrol Dosepak tomorrow. - I have sent hydroxyzine for the itching. - You can also use the topical triamcinolone ointment in the most bothersome areas. - Cool compresses may also help. - Return if you develop a fever swelling or shortness of breath.  Also return for any worsening symptoms.

## 2022-06-30 ENCOUNTER — Encounter: Payer: Self-pay | Admitting: Family Medicine

## 2022-06-30 ENCOUNTER — Ambulatory Visit (INDEPENDENT_AMBULATORY_CARE_PROVIDER_SITE_OTHER): Payer: Medicare HMO | Admitting: Family Medicine

## 2022-06-30 VITALS — BP 122/76 | HR 102 | Ht 62.0 in | Wt 201.6 lb

## 2022-06-30 DIAGNOSIS — F331 Major depressive disorder, recurrent, moderate: Secondary | ICD-10-CM | POA: Diagnosis not present

## 2022-06-30 DIAGNOSIS — R569 Unspecified convulsions: Secondary | ICD-10-CM | POA: Diagnosis not present

## 2022-06-30 DIAGNOSIS — E538 Deficiency of other specified B group vitamins: Secondary | ICD-10-CM | POA: Diagnosis not present

## 2022-06-30 DIAGNOSIS — F419 Anxiety disorder, unspecified: Secondary | ICD-10-CM

## 2022-06-30 DIAGNOSIS — E559 Vitamin D deficiency, unspecified: Secondary | ICD-10-CM | POA: Diagnosis not present

## 2022-06-30 DIAGNOSIS — E669 Obesity, unspecified: Secondary | ICD-10-CM | POA: Diagnosis not present

## 2022-06-30 DIAGNOSIS — Z0000 Encounter for general adult medical examination without abnormal findings: Secondary | ICD-10-CM | POA: Diagnosis not present

## 2022-06-30 MED ORDER — BUSPIRONE HCL 5 MG PO TABS: 5.0000 mg | ORAL_TABLET | Freq: Three times a day (TID) | ORAL | 0 refills | Status: DC | PRN

## 2022-06-30 NOTE — Progress Notes (Signed)
Subjective:    Patient ID: [name], female    DOB: [birth year], 66 y.o.   MRN: [id number]  [name] is a 66 y.o. female presenting on 06/30/2022 for Annual Exam   HPI  Here for Annual Physical and Lab Review.   Lab review today. All results within normal range, except mild total protein and eos  No significant concerns. She is doing well with lifestyle diet Cholesterol is in range Vitamin testing is normal on lab Hemoglobin A1c 5.0 is in normal range.   Anxiety She feels anxious and on edge. Seems worse since this summer. She seems to not be able to "shake" it and she feels about worse possible scenario and obsesses on catastrophic scenario. This is preventing her from doing normal things, she will get too wrapped up with what might happen that she will not    Recent rash with generalized pruritic rash, has improved since trial on Prednisone from Urgent Care  Additional update, with history of acute appendicitis 04/2022, s/p surgical removal, procedure done by Dr Windell Moment. She has done very well.  Health Maintenance:  UTD Flu Shot, PNA , Shingrix vaccine  Colonoscopy 03/2021, next due 7 years, or 2029       06/30/2022    2:37 PM 06/23/2022    3:14 PM 12/21/2021   10:34 AM  Depression screen PHQ 2/9  Decreased Interest 1  0  Down, Depressed, Hopeless 1 1 0  PHQ - 2 Score 2 1 0  Altered sleeping 2 1 1   Tired, decreased energy 2 1 0  Change in appetite 0 0 0  Feeling bad or failure about yourself  1 0 0  Trouble concentrating 1 0 0  Moving slowly or fidgety/restless 0 0 0  Suicidal thoughts 0 0 0  PHQ-9 Score 8 3 1   Difficult doing work/chores Not difficult at all Not difficult at all Not difficult at all      06/30/2022    2:38 PM 12/21/2021   10:34 AM 06/23/2021    9:46 AM 03/08/2021    2:50 PM  GAD 7 : Generalized Anxiety Score  Nervous, Anxious, on Edge 2 0 1 2  Control/stop worrying 2 0 1 2  Worry too much - different things 2 0 1 2   Trouble relaxing 1 0 1 2  Restless 0 0 0 1  Easily annoyed or irritable 0 0 1 1  Afraid - awful might happen 2 0 0 1  Total GAD 7 Score 9 0 5 11  Anxiety Difficulty Not difficult at all Not difficult at all Not difficult at all Somewhat difficult     Past Medical History:  Diagnosis Date   Allergy    Anxiety    Arthritis    left hip   Blood transfusion without reported diagnosis    with hip surgery    GERD (gastroesophageal reflux disease)    Hepatitis 1972   hep B   Hyperlipidemia    controlled on meds    Memory disorder 09/21/2017   OSA (obstructive sleep apnea) 05/08/2018   PONV (postoperative nausea and vomiting)    Seizures ([place])    last seizure 11-2016   Sleep apnea    no cpap    Stroke [facility])    per pt    Past Surgical History:  Procedure Laterality Date   ABDOMINAL HYSTERECTOMY  04/1999   ARTHROSCOPIC REPAIR ACL     bilateral knees   BREAST EXCISIONAL BIOPSY Right  2008   NEG   BREAST EXCISIONAL BIOPSY Left YRS AGO   X2 - NEG   BREAST LUMPECTOMY  06/1979; 03/1995   left   CHOLECYSTECTOMY  09/1987   COLONOSCOPY     LAPAROSCOPIC GASTRIC SLEEVE RESECTION  09/30/2012   Procedure: LAPAROSCOPIC GASTRIC SLEEVE RESECTION;  Surgeon: Madilyn Hook, DO;  Location: [facility];  Service: General;  Laterality: N/A;   POLYPECTOMY     TOTAL HIP ARTHROPLASTY  11/13/2012   left   UPPER GASTROINTESTINAL ENDOSCOPY     UPPER GI ENDOSCOPY  09/30/2012   Procedure: UPPER GI ENDOSCOPY;  Surgeon: Madilyn Hook, DO;  Location: [facility];  Service: General;;   XI ROBOTIC LAPAROSCOPIC ASSISTED APPENDECTOMY N/A 04/24/2022   Procedure: XI ROBOTIC LAPAROSCOPIC ASSISTED APPENDECTOMY;  Surgeon: Herbert Pun, MD;  Location: [facility] ORS;  Service: General;  Laterality: N/A;   Social History   Socioeconomic History   Marital status: Married    Spouse name: Not on file   Number of children: 0   Years of education: BS   Highest education level: Not on file  Occupational History   Not on file   Tobacco Use   Smoking status: Former    Packs/day: 0.25    Years: 5.00    Total pack years: 1.25    Types: Cigarettes    Quit date: 07/25/2001    Years since quitting: 20.9   Smokeless tobacco: Never  Vaping Use   Vaping Use: Never used  Substance and Sexual Activity   Alcohol use: Yes    Alcohol/week: 0.0 standard drinks of alcohol    Comment: 2 glasses of wine per week   Drug use: No   Sexual activity: Not on file  Other Topics Concern   Not on file  Social History Narrative   Lives with spouse   Caffeine use: Drinks coffee daily   Right handed   Social Determinants of Health   Financial Resource Strain: Low Risk  (06/23/2022)   Overall Financial Resource Strain (CARDIA)    Difficulty of Paying Living Expenses: Not very hard  Food Insecurity: No Food Insecurity (06/23/2022)   Hunger Vital Sign    Worried About Running Out of Food in the Last Year: Never true    Ran Out of Food in the Last Year: Never true  Transportation Needs: No Transportation Needs (06/23/2022)   PRAPARE - Hydrologist (Medical): No    Lack of Transportation (Non-Medical): No  Physical Activity: Sufficiently Active (06/23/2022)   Exercise Vital Sign    Days of Exercise per Week: 4 days    Minutes of Exercise per Session: 40 min  Stress: No Stress Concern Present (06/23/2022)   [place]    Feeling of Stress : Only a little  Social Connections: Moderately Integrated (06/23/2022)   Social Connection and Isolation Panel [NHANES]    Frequency of Communication with Friends and Family: More than three times a week    Frequency of Social Gatherings with Friends and Family: Once a week    Attends Religious Services: More than 4 times per year    Active Member of [employer] or Organizations: No    Attends Archivist Meetings: Never    Marital Status: Married  Human resources officer Violence: Not At Risk  (06/23/2022)   Humiliation, Afraid, Rape, and Kick questionnaire    Fear of Current or Ex-Partner: No    Emotionally Abused: No    Physically  Abused: No    Sexually Abused: No   Family History  Problem Relation Age of Onset   Hyperlipidemia Mother    Obesity Mother    Pulmonary fibrosis Mother    Cancer Maternal Aunt        ovarian cancer   Cancer Father    Prostate cancer Father    Colon cancer Paternal Grandmother    Esophageal cancer Neg Hx    Stomach cancer Neg Hx    Rectal cancer Neg Hx    Breast cancer Neg Hx    Colon polyps Neg Hx    Current Outpatient Medications on File Prior to Visit  Medication Sig   B Complex Vitamins (VITAMIN-B COMPLEX PO) Take by mouth daily.   hydrOXYzine (ATARAX) 25 MG tablet Take 1 tablet (25 mg total) by mouth every 6 (six) hours as needed for itching.   lamoTRIgine (LAMICTAL) 100 MG tablet Take 1 tablet (100 mg total) by mouth 2 (two) times daily.   Melatonin 3 MG TABS Take 6 mg by mouth at bedtime.    methylPREDNISolone (MEDROL DOSEPAK) 4 MG TBPK tablet Take according to Dosepak instructions   Multiple Vitamin (MULTIVITAMIN WITH MINERALS) TABS Take 1 tablet by mouth daily.    Omega-3 Fatty Acids (FISH OIL) 1200 MG CAPS Take 1 capsule by mouth daily.   rosuvastatin (CRESTOR) 10 MG tablet TAKE 1 TABLET AT BEDTIME   triamcinolone ointment (KENALOG) 0.1 % Apply 1 Application topically 2 (two) times daily.   venlafaxine XR (EFFEXOR-XR) 150 MG 24 hr capsule TAKE 1 CAPSULE (150 MG TOTAL) BY MOUTH DAILY WITH BREAKFAST. DOSE CHANGE FROM 75MG UP TO 150MG.   No current facility-administered medications on file prior to visit.    Review of Systems  Constitutional:  Negative for activity change, appetite change, chills, diaphoresis, fatigue and fever.  HENT:  Negative for congestion and hearing loss.   Eyes:  Negative for visual disturbance.  Respiratory:  Negative for cough, chest tightness, shortness of breath and wheezing.   Cardiovascular:   Negative for chest pain, palpitations and leg swelling.  Gastrointestinal:  Negative for abdominal pain, constipation, diarrhea, nausea and vomiting.  Genitourinary:  Negative for dysuria, frequency and hematuria.  Musculoskeletal:  Negative for arthralgias and neck pain.  Skin:  Negative for rash.  Neurological:  Negative for dizziness, weakness, light-headedness, numbness and headaches.  Hematological:  Negative for adenopathy.  Psychiatric/Behavioral:  Negative for behavioral problems, dysphoric mood and sleep disturbance.    Per HPI unless specifically indicated above     Objective:    BP 122/76   Pulse (!) 102   Ht 5' 2"  (1.575 m)   Wt 201 lb 9.6 oz (91.4 kg)   SpO2 96%   BMI 36.87 kg/m   Wt Readings from Last 3 Encounters:  06/30/22 201 lb 9.6 oz (91.4 kg)  06/25/22 185 lb (83.9 kg)  06/23/22 192 lb (87.1 kg)    Physical Exam Vitals and nursing note reviewed.  Constitutional:      General: She is not in acute distress.    Appearance: She is well-developed. She is not diaphoretic.     Comments: Well-appearing, comfortable, cooperative  HENT:     Head: Normocephalic and atraumatic.  Eyes:     General:        Right eye: No discharge.        Left eye: No discharge.     Conjunctiva/sclera: Conjunctivae normal.     Pupils: Pupils are equal, round, and reactive to light.  Neck:     Thyroid: No thyromegaly.     Vascular: No carotid bruit.  Cardiovascular:     Rate and Rhythm: Normal rate and regular rhythm.     Pulses: Normal pulses.     Heart sounds: Normal heart sounds. No murmur heard. Pulmonary:     Effort: Pulmonary effort is normal. No respiratory distress.     Breath sounds: Normal breath sounds. No wheezing or rales.  Abdominal:     General: Bowel sounds are normal. There is no distension.     Palpations: Abdomen is soft. There is no mass.     Tenderness: There is no abdominal tenderness.  Musculoskeletal:        General: No tenderness. Normal range of  motion.     Cervical back: Normal range of motion and neck supple.     Right lower leg: No edema.     Left lower leg: No edema.     Comments: Upper / Lower Extremities: - Normal muscle tone, strength bilateral upper extremities 5/5, lower extremities 5/5  Lymphadenopathy:     Cervical: No cervical adenopathy.  Skin:    General: Skin is warm and dry.     Findings: No erythema or rash.  Neurological:     Mental Status: She is alert and oriented to person, place, and time.     Comments: Distal sensation intact to light touch all extremities  Psychiatric:        Mood and Affect: Mood normal.        Behavior: Behavior normal.        Thought Content: Thought content normal.     Comments: Well groomed, good eye contact, normal speech and thoughts      Results for orders placed or performed in visit on 06/22/22  Vitamin B12  Result Value Ref Range   Vitamin B-12 475 200 - 1,100 pg/mL  VITAMIN D 25 Hydroxy (Vit-D Deficiency, Fractures)  Result Value Ref Range   Vit D, 25-Hydroxy 52 30 - 100 ng/mL  TSH  Result Value Ref Range   TSH 2.71 0.40 - 4.50 mIU/L  Hemoglobin A1c  Result Value Ref Range   Hgb A1c MFr Bld 5.0 <5.7 % of total Hgb   Mean Plasma Glucose 97 mg/dL   eAG (mmol/L) 5.4 mmol/L  Lipid panel  Result Value Ref Range   Cholesterol 168 <200 mg/dL   HDL 74 > OR = 50 mg/dL   Triglycerides 114 <150 mg/dL   LDL Cholesterol (Calc) 74 mg/dL (calc)   Total CHOL/HDL Ratio 2.3 <5.0 (calc)   Non-HDL Cholesterol (Calc) 94 <130 mg/dL (calc)  CBC with Differential/Platelet  Result Value Ref Range   WBC 9.6 3.8 - 10.8 Thousand/uL   RBC 3.94 3.80 - 5.10 Million/uL   Hemoglobin 12.7 11.7 - 15.5 g/dL   HCT 38.3 35.0 - 45.0 %   MCV 97.2 80.0 - 100.0 fL   MCH 32.2 27.0 - 33.0 pg   MCHC 33.2 32.0 - 36.0 g/dL   RDW 12.1 11.0 - 15.0 %   Platelets 331 140 - 400 Thousand/uL   MPV 9.1 7.5 - 12.5 fL   Neutro Abs 4,358 1,500 - 7,800 cells/uL   Lymphs Abs 3,773 850 - 3,900 cells/uL    Absolute Monocytes 710 200 - 950 cells/uL   Eosinophils Absolute 672 (H) 15 - 500 cells/uL   Basophils Absolute 86 0 - 200 cells/uL   Neutrophils Relative % 45.4 %   Total Lymphocyte 39.3 %  Monocytes Relative 7.4 %   Eosinophils Relative 7.0 %   Basophils Relative 0.9 %  COMPLETE METABOLIC PANEL WITH GFR  Result Value Ref Range   Glucose, Bld 79 65 - 99 mg/dL   BUN 20 7 - 25 mg/dL   Creat 1.03 0.50 - 1.05 mg/dL   eGFR 60 > OR = 60 mL/min/1.49m   BUN/Creatinine Ratio SEE NOTE: 6 - 22 (calc)   Sodium 143 135 - 146 mmol/L   Potassium 4.0 3.5 - 5.3 mmol/L   Chloride 107 98 - 110 mmol/L   CO2 30 20 - 32 mmol/L   Calcium 8.7 8.6 - 10.4 mg/dL   Total Protein 5.9 (L) 6.1 - 8.1 g/dL   Albumin 3.8 3.6 - 5.1 g/dL   Globulin 2.1 1.9 - 3.7 g/dL (calc)   AG Ratio 1.8 1.0 - 2.5 (calc)   Total Bilirubin 0.5 0.2 - 1.2 mg/dL   Alkaline phosphatase (APISO) 79 37 - 153 U/L   AST 13 10 - 35 U/L   ALT 12 6 - 29 U/L      Assessment & Plan:   Problem List Items Addressed This Visit     Obesity (BMI 35.0-39.9 without comorbidity)   Seizures (HCC)    No breakthrough seizures [facility]      Other Visit Diagnoses     Annual physical exam    -  Primary   Anxiety       Relevant Medications   busPIRone (BUSPAR) 5 MG tablet   Moderate episode of recurrent major depressive disorder (HCC)       Relevant Medications   busPIRone (BUSPAR) 5 MG tablet   Vitamin D deficiency       Vitamin B12 nutritional deficiency           Updated Health Maintenance information Reviewed recent lab results with patient Encouraged improvement to lifestyle with diet and exercise Goal of maintain healthy weight   Recommend Prevnar-20, pneumonia vaccine age 64+ when you are ready, can do at same time as COVID at the pharmacy.  DEXA Scan (Bone mineral density) screening for osteoporosis  [facility] [place] anytime to schedule your own  appointment now that order has been placed.  [facility] [id number]: [phone] -----------------------  Start Buspar for anxiety, 562mtablet up to max 3 times a day as needed. If you prefer to keep some consistency you pick your preferred 1-2 times per day to dose it then save the 3rd as needed.   Meds ordered this encounter  Medications   busPIRone (BUSPAR) 5 MG tablet    Sig: Take 1 tablet (5 mg total) by mouth 3 (three) times daily as needed (anxiety).    Dispense:  90 tablet    Refill:  0      Follow up plan: Return in about 1 year (around 07/01/2023) for 1 year fasting lab only then 1 week later Annual Physical.  AlNobie PutnamDO [place] 06/30/2022, 2:20 PM

## 2022-06-30 NOTE — Assessment & Plan Note (Addendum)
No breakthrough seizures Followed by [facility] in past On Lamictal

## 2022-06-30 NOTE — Patient Instructions (Addendum)
Thank you for coming to the office today.  Recommend Prevnar-20, pneumonia vaccine age 65+ when you are ready, can do at same time as COVID at the pharmacy.  DEXA Scan (Bone mineral density) screening for osteoporosis  Call the [facility] below anytime to schedule your own appointment now that order has been placed.  [place] at [facility] [place], [address] [place], [place] [address] Phone: [phone]  -----------------------  Start Buspar for anxiety, '5mg'$  tablet up to max 3 times a day as needed. If you prefer to keep some consistency you pick your preferred 1-2 times per day to dose it then save the 3rd as needed.   These offices have both PSYCHIATRY doctors and [place] (Virtual Available) Dillwyn Hutchinson [address] [place], [place] [address] Phone: [phone]  [facility] Address: [address] [place], [place] [address] [email or web address] Phone: [phone]  [place] ([place] at [facility]) Address: [address], [place], [place] [address] Hours: 8:30AM-5PM Phone: [phone] (Doctor + therapist only)  [place] (Adult, [place], [facility], Counseling) [address], [place] [address] [place], [place] [address] Phone: [phone] Fax: [phone]  [facility] (All ages) [address] [place], [id number] Phone: [phone] (Option 1) [email or web address]  ----------------------------------------------------------------- THERAPIST ONLY  (No Psychiatry)  [facility] [address], [place] [address] [place] P: [phone]  [facility]) [facility], [facility] [address] [place], [place] [address] [phone]  [place].   Address: [address],  [place], [place] [address] Hours: Open today  9AM-7PM Phone: [phone]  [facility] [address] [place] Address: [address] [place], [place], [place] [address] Phone: [phone]  DUE for FASTING BLOOD WORK (no food or drink after midnight before the lab appointment, only water or coffee without cream/sugar on the morning of)  SCHEDULE "Lab Only" visit in the morning at the clinic for lab draw in 1 YEAR  - Make sure Lab Only appointment is at about 1 week before your next appointment, so that results will be available  For Lab Results, once available within 2-3 days of blood draw, you can can log in to MyChart online to view your results and a brief explanation. Also, we can discuss results at next follow-up visit.     Please schedule a Follow-up Appointment to: Return in about 1 year (around 07/01/2023) for 1 year fasting lab only then 1 week later Annual Physical.  If you have any other questions or concerns, please feel free to call the office or send a message through [place]. You may also schedule an earlier appointment if necessary.  Additionally, you may be receiving a survey about your experience at our office within a few days to 1 week by e-mail or mail. We value your feedback.  Nobie Putnam, DO [place]

## 2022-07-17 ENCOUNTER — Other Ambulatory Visit: Payer: Self-pay | Admitting: Family Medicine

## 2022-07-17 DIAGNOSIS — F419 Anxiety disorder, unspecified: Secondary | ICD-10-CM

## 2022-07-17 MED ORDER — BUSPIRONE HCL 5 MG PO TABS: 5.0000 mg | ORAL_TABLET | Freq: Three times a day (TID) | ORAL | 3 refills | Status: DC | PRN

## 2022-07-17 MED ORDER — BUSPIRONE HCL 5 MG PO TABS: 5.0000 mg | ORAL_TABLET | Freq: Three times a day (TID) | ORAL | 0 refills | Status: DC | PRN

## 2022-08-12 ENCOUNTER — Ambulatory Visit
Admission: EM | Admit: 2022-08-12 | Discharge: 2022-08-12 | Disposition: A | Discharge status: Home / Self Care | Payer: Medicare HMO | Attending: Emergency Medicine | Admitting: Emergency Medicine

## 2022-08-12 ENCOUNTER — Encounter: Payer: Self-pay | Admitting: Emergency Medicine

## 2022-08-12 DIAGNOSIS — M5442 Lumbago with sciatica, left side: Secondary | ICD-10-CM | POA: Diagnosis not present

## 2022-08-12 MED ORDER — BACLOFEN 10 MG PO TABS: 10.0000 mg | ORAL_TABLET | Freq: Three times a day (TID) | ORAL | 0 refills | Status: DC

## 2022-08-12 MED ORDER — IBUPROFEN 600 MG PO TABS: 600.0000 mg | ORAL_TABLET | Freq: Four times a day (QID) | ORAL | 0 refills | Status: AC | PRN

## 2022-08-12 NOTE — Discharge Instructions (Signed)
Take the ibuprofen, 600 mg every 6 hours with food, on a schedule for the next 48 hours and then as needed.  Take the baclofen, 10 mg every 8 hours, on a schedule for the next 48 hours and then as needed.  Apply moist heat to your back for 30 minutes at a time 2-3 times a day to improve blood flow to the area and help remove the lactic acid causing the spasm.  Follow the back exercises given at discharge.  Return for reevaluation for any new or worsening symptoms.

## 2022-08-12 NOTE — ED Triage Notes (Signed)
Patient c/o left sided lower back pain that started last night.  Patient states that she twisted a certain way and felt a pain on the left side of her back.

## 2022-08-12 NOTE — ED Provider Notes (Signed)
[facility]    CSN: [id number] Arrival date & time: 08/12/22  0801      History   Chief Complaint Chief Complaint  Patient presents with   Back Pain    HPI [name] is a 66 y.o. female.   HPI  66 year old female here for evaluation of low back pain.  Patient reports that she developed low back pain yesterday after she twisted.  This caused a sharp pain in her back and she states that it does radiate down through her buttock into the back of her left thigh.  She took ibuprofen last night and that did help the pain and allowed her to sleep.  The pain does increase with movement especially when transitioning from sitting to standing or vice versa.  She denies any numbness or tingling in her foot.  No saddle anesthesia or loss of bowel or bladder control.  Patient does have a significant past medical history to include CVA with residual deficit, CKD stage II, hypertension, OSA, seizures, and carotid artery aneurysm.  Past Medical History:  Diagnosis Date   Allergy    Anxiety    Arthritis    left hip   Blood transfusion without reported diagnosis    with hip surgery    GERD (gastroesophageal reflux disease)    Hepatitis 1972   hep B   Hyperlipidemia    controlled on meds    Memory disorder 09/21/2017   OSA (obstructive sleep apnea) 05/08/2018   PONV (postoperative nausea and vomiting)    Seizures ([place])    last seizure 11-2016   Sleep apnea    no cpap    Stroke [facility])    per pt     Patient Active Problem List   Diagnosis Date Noted   Acute appendicitis with localized peritonitis 04/24/2022   Insomnia 05/05/2020   CKD (chronic kidney disease), stage II 06/20/2019   History of hemorrhagic cerebrovascular accident (CVA) with residual deficit 05/23/2018   OSA (obstructive sleep apnea) 05/08/2018   Aphasia due to and not concurrent with nontraumatic subarachnoid hemorrhage 10/25/2017   Memory disorder 09/21/2017   Seizures ([place]) 04/09/2017    Aneurysm, carotid artery, internal 01/08/2017   Stroke ([place]) 12/22/2016   Leukocytosis 12/12/2016   Cerebral vasospasm 12/08/2016   Hypotension 12/08/2016   Hypertension 12/03/2016   Screening for breast cancer 05/21/2015   Special screening for malignant neoplasms, colon 01/13/2013   Hyperlipidemia 12/19/2012   Obesity (BMI 35.0-39.9 without comorbidity) 09/23/2012   Recurrent major depression in partial remission ([place]) 07/26/2011    Past Surgical History:  Procedure Laterality Date   ABDOMINAL HYSTERECTOMY  04/1999   ARTHROSCOPIC REPAIR ACL     bilateral knees   BREAST EXCISIONAL BIOPSY Right 2008   NEG   BREAST EXCISIONAL BIOPSY Left YRS AGO   X2 - NEG   BREAST LUMPECTOMY  06/1979; 03/1995   left   CHOLECYSTECTOMY  09/1987   COLONOSCOPY     LAPAROSCOPIC GASTRIC SLEEVE RESECTION  09/30/2012   Procedure: LAPAROSCOPIC GASTRIC SLEEVE RESECTION;  Surgeon: Madilyn Hook, DO;  Location: [facility]S;  Service: General;  Laterality: N/A;   POLYPECTOMY     TOTAL HIP ARTHROPLASTY  11/13/2012   left   UPPER GASTROINTESTINAL ENDOSCOPY     UPPER GI ENDOSCOPY  09/30/2012   Procedure: UPPER GI ENDOSCOPY;  Surgeon: Madilyn Hook, DO;  Location: [facility];  Service: General;;   XI ROBOTIC LAPAROSCOPIC ASSISTED APPENDECTOMY N/A 04/24/2022   Procedure: XI ROBOTIC LAPAROSCOPIC ASSISTED APPENDECTOMY;  Surgeon: Herbert Pun, MD;  Location: [facility] ORS;  Service: General;  Laterality: N/A;    OB History   No obstetric history on file.      Home Medications    Prior to Admission medications   Medication Sig Start Date End Date Taking? Authorizing Provider  baclofen (LIORESAL) 10 MG tablet Take 1 tablet (10 mg total) by mouth 3 (three) times daily. 08/12/22  Yes Margarette Canada, NP  ibuprofen (ADVIL) 600 MG tablet Take 1 tablet (600 mg total) by mouth every 6 (six) hours as needed. 08/12/22  Yes Margarette Canada, NP  B Complex Vitamins (VITAMIN-B COMPLEX PO) Take by mouth daily.    [provider]   busPIRone (BUSPAR) 5 MG tablet Take 1 tablet (5 mg total) by mouth 3 (three) times daily as needed (anxiety). 07/17/22   Karamalegos, Devonne Doughty, DO  hydrOXYzine (ATARAX) 25 MG tablet Take 1 tablet (25 mg total) by mouth every 6 (six) hours as needed for itching. 06/25/22   Danton Clap, PA-C  lamoTRIgine (LAMICTAL) 100 MG tablet Take 1 tablet (100 mg total) by mouth 2 (two) times daily. 10/06/21   Suzzanne Cloud, NP  Melatonin 3 MG TABS Take 6 mg by mouth at bedtime.     [provider]  Multiple Vitamin (MULTIVITAMIN WITH MINERALS) TABS Take 1 tablet by mouth daily.     [provider]  Omega-3 Fatty Acids (FISH OIL) 1200 MG CAPS Take 1 capsule by mouth daily.    [provider]  rosuvastatin (CRESTOR) 10 MG tablet TAKE 1 TABLET AT BEDTIME 04/24/22   Karamalegos, Devonne Doughty, DO  triamcinolone ointment (KENALOG) 0.1 % Apply 1 Application topically 2 (two) times daily. 06/25/22   Laurene Footman B, PA-C  venlafaxine XR (EFFEXOR-XR) 150 MG 24 hr capsule TAKE 1 CAPSULE (150 MG TOTAL) BY MOUTH DAILY WITH BREAKFAST. DOSE CHANGE FROM '75MG'$  UP TO '150MG'$ . 04/28/22   Karamalegos, Devonne Doughty, DO    Family History Family History  Problem Relation Age of Onset   Hyperlipidemia Mother    Obesity Mother    Pulmonary fibrosis Mother    Cancer Maternal Aunt        ovarian cancer   Cancer Father    Prostate cancer Father    Colon cancer Paternal Grandmother    Esophageal cancer Neg Hx    Stomach cancer Neg Hx    Rectal cancer Neg Hx    Breast cancer Neg Hx    Colon polyps Neg Hx     Social History Social History   Tobacco Use   Smoking status: Former    Packs/day: 0.25    Years: 5.00    Total pack years: 1.25    Types: Cigarettes    Quit date: 07/25/2001    Years since quitting: 21.0   Smokeless tobacco: Never  Vaping Use   Vaping Use: Never used  Substance Use Topics   Alcohol use: Yes    Alcohol/week: 0.0 standard drinks of alcohol    Comment: 2 glasses of  wine per week   Drug use: No     Allergies   Patient has no known allergies.   Review of Systems Review of Systems  Musculoskeletal:  Positive for back pain and myalgias.  Neurological:  Negative for weakness and numbness.     Physical Exam Triage Vital Signs ED Triage Vitals [08/12/22 0815]  Enc Vitals Group     BP      Pulse  Resp      Temp      Temp src      SpO2      Weight 185 lb (83.9 kg)     Height '5\' 2"'$  (1.575 m)     Head Circumference      Peak Flow      Pain Score 8     Pain Loc      Pain Edu?      Excl. in [place]?    No data found.  Updated Vital Signs BP 94/68 (BP Location: Left Arm)   Pulse 73   Temp 97.9 F (36.6 C) (Oral)   Resp 14   Ht '5\' 2"'$  (1.575 m)   Wt 185 lb (83.9 kg)   SpO2 100%   BMI 33.84 kg/m   Visual Acuity Right Eye Distance:   Left Eye Distance:   Bilateral Distance:    Right Eye Near:   Left Eye Near:    Bilateral Near:     Physical Exam Vitals and nursing note reviewed.  Constitutional:      Appearance: Normal appearance. She is not ill-appearing.  HENT:     Head: Normocephalic and atraumatic.  Cardiovascular:     Rate and Rhythm: Normal rate and regular rhythm.     Pulses: Normal pulses.     Heart sounds: Normal heart sounds. No murmur heard.    No friction rub. No gallop.  Pulmonary:     Effort: Pulmonary effort is normal.     Breath sounds: Normal breath sounds. No wheezing, rhonchi or rales.  Musculoskeletal:        General: Tenderness present. No swelling, deformity or signs of injury.  Skin:    General: Skin is warm and dry.     Capillary Refill: Capillary refill takes less than 2 seconds.  Neurological:     General: No focal deficit present.     Mental Status: She is alert and oriented to person, place, and time.  Psychiatric:        Mood and Affect: Mood normal.        Behavior: Behavior normal.        Thought Content: Thought content normal.        Judgment: Judgment normal.      UC  Treatments / Results  Labs (all labs ordered are listed, but only abnormal results are displayed) Labs Reviewed - No data to display  EKG   Radiology No results found.  Procedures Procedures (including critical care time)  Medications Ordered in UC Medications - No data to display  Initial Impression / Assessment and Plan / UC Course  I have reviewed the triage vital signs and the nursing notes.  Pertinent labs & imaging results that were available during my care of the patient were reviewed by me and considered in my medical decision making (see chart for details).   Patient is a pleasant, nontoxic-appearing [age]-year-old female presenting for evaluation of left-sided low back pain that started last night as outlined in HPI above.  On exam there is spasm in the left lower lumbar paraspinous region.  No midline spinous tenderness or step-off appreciated on exam.  Patient's bilateral lower extremity strength is 5/5.  She does have a positive seated straight leg raise on the left.  Her exam is consistent with low back pain which is inducing sciatica.  She does have a history of stroke and CKD.  Her most recent renal function states that she had a GFR of  60 in September of this year.  She has been using ibuprofen at home so I am going to encourage her to do so on a short-term basis.  I will also add on baclofen 10 mg 3 times a day.  I have instructed her on home physical therapy and the use of moist heat to improve blood flow to the area and help resolve the spasm.  Return precautions reviewed.   Final Clinical Impressions(s) / UC Diagnoses   Final diagnoses:  Acute left-sided low back pain with left-sided sciatica     Discharge Instructions      Take the ibuprofen, 600 mg every 6 hours with food, on a schedule for the next 48 hours and then as needed.  Take the baclofen, 10 mg every 8 hours, on a schedule for the next 48 hours and then as needed.  Apply moist heat to your back  for 30 minutes at a time 2-3 times a day to improve blood flow to the area and help remove the lactic acid causing the spasm.  Follow the back exercises given at discharge.  Return for reevaluation for any new or worsening symptoms.      ED Prescriptions     Medication Sig Dispense Auth. Provider   ibuprofen (ADVIL) 600 MG tablet Take 1 tablet (600 mg total) by mouth every 6 (six) hours as needed. 30 tablet Margarette Canada, NP   baclofen (LIORESAL) 10 MG tablet Take 1 tablet (10 mg total) by mouth 3 (three) times daily. 72 each Margarette Canada, NP      PDMP not reviewed this encounter.   Margarette Canada, NP 08/12/22 [phone]

## 2022-08-19 ENCOUNTER — Other Ambulatory Visit: Payer: Self-pay | Admitting: Neurology

## 2022-09-13 DIAGNOSIS — M9903 Segmental and somatic dysfunction of lumbar region: Secondary | ICD-10-CM | POA: Diagnosis not present

## 2022-09-13 DIAGNOSIS — M9904 Segmental and somatic dysfunction of sacral region: Secondary | ICD-10-CM | POA: Diagnosis not present

## 2022-09-13 DIAGNOSIS — M5416 Radiculopathy, lumbar region: Secondary | ICD-10-CM | POA: Diagnosis not present

## 2022-09-13 DIAGNOSIS — M62830 Muscle spasm of back: Secondary | ICD-10-CM | POA: Diagnosis not present

## 2022-09-15 DIAGNOSIS — M5416 Radiculopathy, lumbar region: Secondary | ICD-10-CM | POA: Diagnosis not present

## 2022-09-15 DIAGNOSIS — M62830 Muscle spasm of back: Secondary | ICD-10-CM | POA: Diagnosis not present

## 2022-09-15 DIAGNOSIS — M9903 Segmental and somatic dysfunction of lumbar region: Secondary | ICD-10-CM | POA: Diagnosis not present

## 2022-09-15 DIAGNOSIS — M9904 Segmental and somatic dysfunction of sacral region: Secondary | ICD-10-CM | POA: Diagnosis not present

## 2022-09-17 ENCOUNTER — Other Ambulatory Visit: Payer: Self-pay | Admitting: Family Medicine

## 2022-09-17 DIAGNOSIS — E7800 Pure hypercholesterolemia, unspecified: Secondary | ICD-10-CM

## 2022-09-18 DIAGNOSIS — M9903 Segmental and somatic dysfunction of lumbar region: Secondary | ICD-10-CM | POA: Diagnosis not present

## 2022-09-18 DIAGNOSIS — M62830 Muscle spasm of back: Secondary | ICD-10-CM | POA: Diagnosis not present

## 2022-09-18 DIAGNOSIS — M9904 Segmental and somatic dysfunction of sacral region: Secondary | ICD-10-CM | POA: Diagnosis not present

## 2022-09-18 DIAGNOSIS — M5416 Radiculopathy, lumbar region: Secondary | ICD-10-CM | POA: Diagnosis not present

## 2022-09-18 NOTE — Telephone Encounter (Signed)
Requested Prescriptions  Pending Prescriptions Disp Refills   rosuvastatin (CRESTOR) 10 MG tablet [Pharmacy Med Name: ROSUVASTATIN CALCIUM 10 MG Tablet] 90 tablet 3    Sig: TAKE 1 TABLET AT BEDTIME     Cardiovascular:  Antilipid - Statins 2 Failed - 09/17/2022  3:02 AM      Failed - Lipid Panel in normal range within the last 12 months    Cholesterol  Date Value Ref Range Status  06/23/2022 168 <200 mg/dL Final   LDL Cholesterol (Calc)  Date Value Ref Range Status  06/23/2022 74 mg/dL (calc) Final    Comment:    Reference range: <100 . Desirable range <100 mg/dL for primary prevention;   <70 mg/dL for patients with CHD or diabetic patients  with > or = 2 CHD risk factors. Marland Kitchen LDL-C is now calculated using the Martin-Hopkins  calculation, which is a validated novel method providing  better accuracy than the Friedewald equation in the  estimation of LDL-C.  Cresenciano Genre et al. Annamaria Helling. [id number];696(29): 2061-2068  (http://education.[facility].com/faq/FAQ164)    Direct LDL  Date Value Ref Range Status  12/19/2012 146.6 mg/dL Final    Comment:    Optimal:  <100 mg/dLNear or Above Optimal:  100-129 mg/dLBorderline High:  130-159 mg/dLHigh:  160-189 mg/dLVery High:  >190 mg/dL   HDL  Date Value Ref Range Status  06/23/2022 74 > OR = 50 mg/dL Final   Triglycerides  Date Value Ref Range Status  06/23/2022 114 <150 mg/dL Final         Passed - Cr in normal range and within 360 days    Creat  Date Value Ref Range Status  06/23/2022 1.03 0.50 - 1.05 mg/dL Final   Creatinine,U  Date Value Ref Range Status  03/31/2014 113.8 mg/dL Final         Passed - Patient is not pregnant      Passed - Valid encounter within last 12 months    Recent Outpatient Visits           2 months ago Annual physical exam   [place], DO   9 months ago Pure hypercholesterolemia   [place], DO   1 year ago  Annual physical exam   [place], DO   1 year ago Neck pain on left side   [place], DO   1 year ago Moderate episode of recurrent major depressive disorder [facility])   [place], Devonne Doughty, DO

## 2022-09-20 DIAGNOSIS — M9904 Segmental and somatic dysfunction of sacral region: Secondary | ICD-10-CM | POA: Diagnosis not present

## 2022-09-20 DIAGNOSIS — M5416 Radiculopathy, lumbar region: Secondary | ICD-10-CM | POA: Diagnosis not present

## 2022-09-20 DIAGNOSIS — M9903 Segmental and somatic dysfunction of lumbar region: Secondary | ICD-10-CM | POA: Diagnosis not present

## 2022-09-20 DIAGNOSIS — M62830 Muscle spasm of back: Secondary | ICD-10-CM | POA: Diagnosis not present

## 2022-09-22 DIAGNOSIS — M5416 Radiculopathy, lumbar region: Secondary | ICD-10-CM | POA: Diagnosis not present

## 2022-09-22 DIAGNOSIS — M9904 Segmental and somatic dysfunction of sacral region: Secondary | ICD-10-CM | POA: Diagnosis not present

## 2022-09-22 DIAGNOSIS — M9903 Segmental and somatic dysfunction of lumbar region: Secondary | ICD-10-CM | POA: Diagnosis not present

## 2022-09-22 DIAGNOSIS — M62830 Muscle spasm of back: Secondary | ICD-10-CM | POA: Diagnosis not present

## 2022-09-25 DIAGNOSIS — M62830 Muscle spasm of back: Secondary | ICD-10-CM | POA: Diagnosis not present

## 2022-09-25 DIAGNOSIS — M9904 Segmental and somatic dysfunction of sacral region: Secondary | ICD-10-CM | POA: Diagnosis not present

## 2022-09-25 DIAGNOSIS — M5416 Radiculopathy, lumbar region: Secondary | ICD-10-CM | POA: Diagnosis not present

## 2022-09-25 DIAGNOSIS — M9903 Segmental and somatic dysfunction of lumbar region: Secondary | ICD-10-CM | POA: Diagnosis not present

## 2022-09-27 DIAGNOSIS — M9904 Segmental and somatic dysfunction of sacral region: Secondary | ICD-10-CM | POA: Diagnosis not present

## 2022-09-27 DIAGNOSIS — M5416 Radiculopathy, lumbar region: Secondary | ICD-10-CM | POA: Diagnosis not present

## 2022-09-27 DIAGNOSIS — M62830 Muscle spasm of back: Secondary | ICD-10-CM | POA: Diagnosis not present

## 2022-09-27 DIAGNOSIS — M9903 Segmental and somatic dysfunction of lumbar region: Secondary | ICD-10-CM | POA: Diagnosis not present

## 2022-09-29 DIAGNOSIS — M9904 Segmental and somatic dysfunction of sacral region: Secondary | ICD-10-CM | POA: Diagnosis not present

## 2022-09-29 DIAGNOSIS — M9903 Segmental and somatic dysfunction of lumbar region: Secondary | ICD-10-CM | POA: Diagnosis not present

## 2022-09-29 DIAGNOSIS — M62830 Muscle spasm of back: Secondary | ICD-10-CM | POA: Diagnosis not present

## 2022-09-29 DIAGNOSIS — M5416 Radiculopathy, lumbar region: Secondary | ICD-10-CM | POA: Diagnosis not present

## 2022-10-04 DIAGNOSIS — M9903 Segmental and somatic dysfunction of lumbar region: Secondary | ICD-10-CM | POA: Diagnosis not present

## 2022-10-04 DIAGNOSIS — M5416 Radiculopathy, lumbar region: Secondary | ICD-10-CM | POA: Diagnosis not present

## 2022-10-04 DIAGNOSIS — M9904 Segmental and somatic dysfunction of sacral region: Secondary | ICD-10-CM | POA: Diagnosis not present

## 2022-10-04 DIAGNOSIS — M62830 Muscle spasm of back: Secondary | ICD-10-CM | POA: Diagnosis not present

## 2022-10-06 DIAGNOSIS — M9903 Segmental and somatic dysfunction of lumbar region: Secondary | ICD-10-CM | POA: Diagnosis not present

## 2022-10-06 DIAGNOSIS — M9904 Segmental and somatic dysfunction of sacral region: Secondary | ICD-10-CM | POA: Diagnosis not present

## 2022-10-06 DIAGNOSIS — M62830 Muscle spasm of back: Secondary | ICD-10-CM | POA: Diagnosis not present

## 2022-10-06 DIAGNOSIS — M5416 Radiculopathy, lumbar region: Secondary | ICD-10-CM | POA: Diagnosis not present

## 2022-10-10 DIAGNOSIS — M62830 Muscle spasm of back: Secondary | ICD-10-CM | POA: Diagnosis not present

## 2022-10-10 DIAGNOSIS — M5416 Radiculopathy, lumbar region: Secondary | ICD-10-CM | POA: Diagnosis not present

## 2022-10-10 DIAGNOSIS — H524 Presbyopia: Secondary | ICD-10-CM | POA: Diagnosis not present

## 2022-10-10 DIAGNOSIS — M9903 Segmental and somatic dysfunction of lumbar region: Secondary | ICD-10-CM | POA: Diagnosis not present

## 2022-10-10 DIAGNOSIS — M9904 Segmental and somatic dysfunction of sacral region: Secondary | ICD-10-CM | POA: Diagnosis not present

## 2022-10-11 ENCOUNTER — Ambulatory Visit: Payer: Medicare HMO | Admitting: Neurology

## 2022-10-20 DIAGNOSIS — M62830 Muscle spasm of back: Secondary | ICD-10-CM | POA: Diagnosis not present

## 2022-10-20 DIAGNOSIS — M5416 Radiculopathy, lumbar region: Secondary | ICD-10-CM | POA: Diagnosis not present

## 2022-10-20 DIAGNOSIS — M9903 Segmental and somatic dysfunction of lumbar region: Secondary | ICD-10-CM | POA: Diagnosis not present

## 2022-10-20 DIAGNOSIS — M9904 Segmental and somatic dysfunction of sacral region: Secondary | ICD-10-CM | POA: Diagnosis not present

## 2022-10-24 DIAGNOSIS — Z0100 Encounter for examination of eyes and vision without abnormal findings: Secondary | ICD-10-CM | POA: Diagnosis not present

## 2022-11-03 DIAGNOSIS — M62830 Muscle spasm of back: Secondary | ICD-10-CM | POA: Diagnosis not present

## 2022-11-03 DIAGNOSIS — M9903 Segmental and somatic dysfunction of lumbar region: Secondary | ICD-10-CM | POA: Diagnosis not present

## 2022-11-03 DIAGNOSIS — M9904 Segmental and somatic dysfunction of sacral region: Secondary | ICD-10-CM | POA: Diagnosis not present

## 2022-11-03 DIAGNOSIS — M5416 Radiculopathy, lumbar region: Secondary | ICD-10-CM | POA: Diagnosis not present

## 2022-11-28 DIAGNOSIS — M62830 Muscle spasm of back: Secondary | ICD-10-CM | POA: Diagnosis not present

## 2022-11-28 DIAGNOSIS — M9903 Segmental and somatic dysfunction of lumbar region: Secondary | ICD-10-CM | POA: Diagnosis not present

## 2022-11-28 DIAGNOSIS — M9904 Segmental and somatic dysfunction of sacral region: Secondary | ICD-10-CM | POA: Diagnosis not present

## 2022-11-28 DIAGNOSIS — M5416 Radiculopathy, lumbar region: Secondary | ICD-10-CM | POA: Diagnosis not present

## 2022-12-07 ENCOUNTER — Other Ambulatory Visit: Payer: Self-pay | Admitting: Family Medicine

## 2022-12-07 DIAGNOSIS — F3341 Major depressive disorder, recurrent, in partial remission: Secondary | ICD-10-CM

## 2022-12-07 NOTE — Telephone Encounter (Signed)
Requested Prescriptions  Pending Prescriptions Disp Refills   venlafaxine XR (EFFEXOR-XR) 150 MG 24 hr capsule [Pharmacy Med Name: VENLAFAXINE HYDROCHLORIDEER 150 MG Capsule Extended Release 24 Hour] 90 capsule 3    Sig: TAKE 1 CAPSULE (150 MG TOTAL) DAILY WITH BREAKFAST. DOSE CHANGE FROM '75MG'$  UP TO '150MG'$ .     Psychiatry: Antidepressants - SNRI - desvenlafaxine & venlafaxine Failed - 12/07/2022  2:48 AM      Failed - Lipid Panel in normal range within the last 12 months    Cholesterol  Date Value Ref Range Status  06/23/2022 168 <200 mg/dL Final   LDL Cholesterol (Calc)  Date Value Ref Range Status  06/23/2022 74 mg/dL (calc) Final    Comment:    Reference range: <100 . Desirable range <100 mg/dL for primary prevention;   <70 mg/dL for patients with CHD or diabetic patients  with > or = 2 CHD risk factors. Marland Kitchen LDL-C is now calculated using the Martin-Hopkins  calculation, which is a validated novel method providing  better accuracy than the Friedewald equation in the  estimation of LDL-C.  Cresenciano Genre et al. Annamaria Helling. [id number]): 2061-2068  (http://education.[facility].com/faq/FAQ164)    Direct LDL  Date Value Ref Range Status  12/19/2012 146.6 mg/dL Final    Comment:    Optimal:  <100 mg/dLNear or Above Optimal:  100-129 mg/dLBorderline High:  130-159 mg/dLHigh:  160-189 mg/dLVery High:  >190 mg/dL   HDL  Date Value Ref Range Status  06/23/2022 74 > OR = 50 mg/dL Final   Triglycerides  Date Value Ref Range Status  06/23/2022 114 <150 mg/dL Final         Passed - Cr in normal range and within 360 days    Creat  Date Value Ref Range Status  06/23/2022 1.03 0.50 - 1.05 mg/dL Final   Creatinine,U  Date Value Ref Range Status  03/31/2014 113.8 mg/dL Final         Passed - Completed PHQ-2 or PHQ-9 in the last 360 days      Passed - Last BP in normal range    BP Readings from Last 1 Encounters:  08/12/22 94/68         Passed - Valid encounter within last 6  months    Recent Outpatient Visits           5 months ago Annual physical exam   [facility] [place] Hauser, DO   11 months ago Pure hypercholesterolemia   [place], DO   1 year ago Annual physical exam   [place], DO   1 year ago Neck pain on left side   [place], DO   1 year ago Moderate episode of recurrent major depressive disorder [facility])   [place], [place]

## 2022-12-13 NOTE — Progress Notes (Unsigned)
PATIENT: [name] DOB: 1956-09-18  REASON FOR VISIT: Follow up HISTORY FROM: Patient PRIMARY NEUROLOGIST: Dr. April Manson now that Dr. Jannifer Franklin is retired  HISTORY OF PRESENT ILLNESS: Today 12/14/22 Here with her husband. Doing well. No seizures.  Remains on Lamictal 100 mg twice a day.  Total has had 5 seizure since 2018, last was in 2019. Going through having dental implants placed in [place].  10/06/2021 SS: Ruben Gottron here today for follow-up with history of subarachnoid hemorrhage associated with left frontal stroke in 2018, began having seizures 1 year later.  Has been treated with Keppra, reported side effect of drowsiness. Tapered off Keppra and switched to Lamictal 100 mg BID back in August. Doing much better, a few times some dizziness, but rests and goes away. Previous seizures before have been total loss of consciousness. Getting around well. Has lost about 15 lbs. Here today alone.   HISTORY 04/28/2021 Dr. Jannifer Franklin: [name] is a 67 year old right-handed white female with a history of a subarachnoid hemorrhage associated with a left frontal stroke in February 2018.  Within a year after the event, she began having seizures.  She has sleep apnea as well but is not on CPAP.  She has lost about 50 pounds of weight and she no longer snores at night.  She is on Keppra, but even on the long-acting preparation she is reporting issues with drowsiness and cognitive slowing.  The patient has recently switched off of Prozac and has been switched to Effexor, she is being treated for depression.  She has not had any further seizures, she is operating a motor vehicle well.  She wishes to come off of the [place].  She does report some mild gait instability at times, she has not had any falls   REVIEW OF SYSTEMS: Out of a complete 14 system review of symptoms, the patient complains only of the following symptoms, and all other reviewed systems are negative.  See HPI  ALLERGIES: No Known  Allergies  HOME MEDICATIONS: Outpatient Medications Prior to Visit  Medication Sig Dispense Refill   B Complex Vitamins (VITAMIN-B COMPLEX PO) Take by mouth daily.     baclofen (LIORESAL) 10 MG tablet Take 1 tablet (10 mg total) by mouth 3 (three) times daily. 30 each 0   busPIRone (BUSPAR) 5 MG tablet Take 1 tablet (5 mg total) by mouth 3 (three) times daily as needed (anxiety). 270 tablet 3   hydrOXYzine (ATARAX) 25 MG tablet Take 1 tablet (25 mg total) by mouth every 6 (six) hours as needed for itching. 30 tablet 0   ibuprofen (ADVIL) 600 MG tablet Take 1 tablet (600 mg total) by mouth every 6 (six) hours as needed. 30 tablet 0   Melatonin 3 MG TABS Take 6 mg by mouth at bedtime.      Multiple Vitamin (MULTIVITAMIN WITH MINERALS) TABS Take 1 tablet by mouth daily.      Omega-3 Fatty Acids (FISH OIL) 1200 MG CAPS Take 1 capsule by mouth daily.     rosuvastatin (CRESTOR) 10 MG tablet TAKE 1 TABLET AT BEDTIME 90 tablet 3   triamcinolone ointment (KENALOG) 0.1 % Apply 1 Application topically 2 (two) times daily. 30 g 0   venlafaxine XR (EFFEXOR-XR) 150 MG 24 hr capsule TAKE 1 CAPSULE (150 MG TOTAL) DAILY WITH BREAKFAST. DOSE CHANGE FROM '75MG'$  UP TO '150MG'$ . 90 capsule 1   lamoTRIgine (LAMICTAL) 100 MG tablet TAKE 1 TABLET TWICE DAILY 180 tablet 5   No facility-administered medications  prior to visit.    PAST MEDICAL HISTORY: Past Medical History:  Diagnosis Date   Allergy    Anxiety    Arthritis    left hip   Blood transfusion without reported diagnosis    with hip surgery    GERD (gastroesophageal reflux disease)    Hepatitis 1972   hep B   Hyperlipidemia    controlled on meds    Memory disorder 09/21/2017   OSA (obstructive sleep apnea) 05/08/2018   PONV (postoperative nausea and vomiting)    Seizures ([place])    last seizure 11-2016   Sleep apnea    no cpap    Stroke ([place])    per pt     PAST SURGICAL HISTORY: Past Surgical History:  Procedure Laterality Date   ABDOMINAL  HYSTERECTOMY  04/1999   ARTHROSCOPIC REPAIR ACL     bilateral knees   BREAST EXCISIONAL BIOPSY Right 2008   NEG   BREAST EXCISIONAL BIOPSY Left YRS AGO   X2 - NEG   BREAST LUMPECTOMY  06/1979; 03/1995   left   CHOLECYSTECTOMY  09/1987   COLONOSCOPY     LAPAROSCOPIC GASTRIC SLEEVE RESECTION  09/30/2012   Procedure: LAPAROSCOPIC GASTRIC SLEEVE RESECTION;  Surgeon: Madilyn Hook, DO;  Location: [facility];  Service: General;  Laterality: N/A;   POLYPECTOMY     TOTAL HIP ARTHROPLASTY  11/13/2012   left   UPPER GASTROINTESTINAL ENDOSCOPY     UPPER GI ENDOSCOPY  09/30/2012   Procedure: UPPER GI ENDOSCOPY;  Surgeon: Madilyn Hook, DO;  Location: [facility];  Service: General;;   XI ROBOTIC LAPAROSCOPIC ASSISTED APPENDECTOMY N/A 04/24/2022   Procedure: XI ROBOTIC LAPAROSCOPIC ASSISTED APPENDECTOMY;  Surgeon: Herbert Pun, MD;  Location: [facility];  Service: General;  Laterality: N/A;    FAMILY HISTORY: Family History  Problem Relation Age of Onset   Hyperlipidemia Mother    Obesity Mother    Pulmonary fibrosis Mother    Cancer Maternal Aunt        ovarian cancer   Cancer Father    Prostate cancer Father    Colon cancer Paternal Grandmother    Esophageal cancer Neg Hx    Stomach cancer Neg Hx    Rectal cancer Neg Hx    Breast cancer Neg Hx    Colon polyps Neg Hx     SOCIAL HISTORY: Social History   Socioeconomic History   Marital status: Married    Spouse name: Not on file   Number of children: 0   Years of education: BS   Highest education level: Not on file  Occupational History   Not on file  Tobacco Use   Smoking status: Former    Packs/day: 0.25    Years: 5.00    Total pack years: 1.25    Types: Cigarettes    Quit date: 07/25/2001    Years since quitting: 21.4   Smokeless tobacco: Never  Vaping Use   Vaping Use: Never used  Substance and Sexual Activity   Alcohol use: Yes    Alcohol/week: 0.0 standard drinks of alcohol    Comment: 2 glasses of wine per week    Drug use: No   Sexual activity: Not on file  Other Topics Concern   Not on file  Social History Narrative   Lives with spouse   Caffeine use: Drinks coffee daily   Right handed   Social Determinants of Health   Financial Resource Strain: Low Risk  (06/23/2022)   Overall Financial Resource Strain (  CARDIA)    Difficulty of Paying Living Expenses: Not very hard  Food Insecurity: No Food Insecurity (06/23/2022)   Hunger Vital Sign    Worried About Running Out of Food in the Last Year: Never true    Ran Out of Food in the Last Year: Never true  Transportation Needs: No Transportation Needs (06/23/2022)   PRAPARE - Hydrologist (Medical): No    Lack of Transportation (Non-Medical): No  Physical Activity: Sufficiently Active (06/23/2022)   Exercise Vital Sign    Days of Exercise per Week: 4 days    Minutes of Exercise per Session: 40 min  Stress: No Stress Concern Present (06/23/2022)   [place]    Feeling of Stress : Only a little  Social Connections: Moderately Integrated (06/23/2022)   Social Connection and Isolation Panel [NHANES]    Frequency of Communication with Friends and Family: More than three times a week    Frequency of Social Gatherings with Friends and Family: Once a week    Attends Religious Services: More than 4 times per year    Active Member of [employer] or Organizations: No    Attends Archivist Meetings: Never    Marital Status: Married  Human resources officer Violence: Not At Risk (06/23/2022)   Humiliation, Afraid, Rape, and Kick questionnaire    Fear of Current or Ex-Partner: No    Emotionally Abused: No    Physically Abused: No    Sexually Abused: No   PHYSICAL EXAM  Vitals:   12/14/22 1249  BP: 134/84  Pulse: 88  Weight: 185 lb (83.9 kg)  Height: '5\' 2"'$  (1.575 m)    Body mass index is 33.84 kg/m.  Generalized: Well developed, in no acute distress    Neurological examination  Mentation: Alert oriented to time, place, history taking. Follows all commands speech and language fluent Cranial nerve II-XII: Pupils were equal round reactive to light. Extraocular movements were full, visual field were full on confrontational test. Facial sensation and strength were normal. Head turning and shoulder shrug  were normal and symmetric. Motor: The motor testing reveals 5 over 5 strength of all 4 extremities. Good symmetric motor tone is noted throughout.  Sensory: Sensory testing is intact to soft touch on all 4 extremities. No evidence of extinction is noted.  Coordination: Cerebellar testing reveals good finger-nose-finger and heel-to-shin bilaterally.  Gait and station: Gait is normal.  Reflexes: Deep tendon reflexes are symmetric and normal bilaterally.   DIAGNOSTIC DATA (LABS, IMAGING, TESTING) - I reviewed patient records, labs, notes, testing and imaging myself where available.  Lab Results  Component Value Date   WBC 9.6 06/23/2022   HGB 12.7 06/23/2022   HCT 38.3 06/23/2022   MCV 97.2 06/23/2022   PLT 331 06/23/2022      Component Value Date/Time   NA 143 06/23/2022 0755   NA 140 11/18/2012 0530   K 4.0 06/23/2022 0755   K 4.0 11/18/2012 0530   CL 107 06/23/2022 0755   CL 108 (H) 11/18/2012 0530   CO2 30 06/23/2022 0755   CO2 27 11/18/2012 0530   GLUCOSE 79 06/23/2022 0755   GLUCOSE 95 11/18/2012 0530   BUN 20 06/23/2022 0755   BUN 8 11/18/2012 0530   CREATININE 1.03 06/23/2022 0755   CALCIUM 8.7 06/23/2022 0755   CALCIUM 7.9 (L) 11/18/2012 0530   PROT 5.9 (L) 06/23/2022 0755   ALBUMIN 3.7 04/24/2022 1113   AST  13 06/23/2022 0755   ALT 12 06/23/2022 0755   ALKPHOS 77 04/24/2022 1113   BILITOT 0.5 06/23/2022 0755   GFRNONAA >60 04/24/2022 1113   GFRNONAA 60 06/16/2020 0944   GFRAA 69 06/16/2020 0944   Lab Results  Component Value Date   CHOL 168 06/23/2022   HDL 74 06/23/2022   LDLCALC 74 06/23/2022   LDLDIRECT  146.6 12/19/2012   TRIG 114 06/23/2022   CHOLHDL 2.3 06/23/2022   Lab Results  Component Value Date   HGBA1C 5.0 06/23/2022   Lab Results  Component Value Date   [id number] 06/23/2022   Lab Results  Component Value Date   TSH 2.71 06/23/2022   ASSESSMENT AND PLAN 67 y.o. year old female  has a past medical history of Allergy, Anxiety, Arthritis, Blood transfusion without reported diagnosis, GERD (gastroesophageal reflux disease), Hepatitis (1972), Hyperlipidemia, Memory disorder (09/21/2017), OSA (obstructive sleep apnea) (05/08/2018), PONV (postoperative nausea and vomiting), Seizures ([place]), Sleep apnea, and Stroke ([place]). here with:  History of seizures, well controlled  Subarachnoid hemorrhage associated with left frontal stroke in January 2018, subsequent seizures  -Continues to do well, last seizure was in 2019 -Continue Lamictal 100 mg twice a day -Lamictal level 4.10 September 2021, reviewed recent CBC, CMP -Follow-up in 1 year virtually or sooner if needed  Butler Denmark, AGNP-C, DNP 12/14/2022, 1:10 PM [facility] [address], [place], [place] [address] [phone]

## 2022-12-14 ENCOUNTER — Ambulatory Visit: Payer: Medicare HMO | Admitting: Neurology

## 2022-12-14 ENCOUNTER — Encounter: Payer: Self-pay | Admitting: Neurology

## 2022-12-14 VITALS — BP 134/84 | HR 88 | Ht 62.0 in | Wt 185.0 lb

## 2022-12-14 DIAGNOSIS — R569 Unspecified convulsions: Secondary | ICD-10-CM

## 2022-12-14 DIAGNOSIS — I639 Cerebral infarction, unspecified: Secondary | ICD-10-CM | POA: Diagnosis not present

## 2022-12-14 MED ORDER — LAMOTRIGINE 100 MG PO TABS: 100.0000 mg | ORAL_TABLET | Freq: Two times a day (BID) | ORAL | 4 refills | Status: DC

## 2022-12-15 ENCOUNTER — Ambulatory Visit (INDEPENDENT_AMBULATORY_CARE_PROVIDER_SITE_OTHER): Payer: Medicare HMO | Admitting: Family Medicine

## 2022-12-15 ENCOUNTER — Encounter: Payer: Self-pay | Admitting: Family Medicine

## 2022-12-15 VITALS — BP 118/80 | HR 95 | Ht 62.0 in | Wt 222.0 lb

## 2022-12-15 DIAGNOSIS — K047 Periapical abscess without sinus: Secondary | ICD-10-CM

## 2022-12-15 DIAGNOSIS — Z6841 Body Mass Index (BMI) 40.0 and over, adult: Secondary | ICD-10-CM | POA: Diagnosis not present

## 2022-12-15 MED ORDER — AMOXICILLIN-POT CLAVULANATE 875-125 MG PO TABS: 1.0000 | ORAL_TABLET | Freq: Two times a day (BID) | ORAL | 0 refills | Status: DC

## 2022-12-15 NOTE — Patient Instructions (Addendum)
Thank you for coming to the office today.  Augmentin twice a day for 10 days. Then recommend to follow up with Dentist.   Dental Abscess  A dental abscess is an infection around a tooth that may involve pain, swelling, and a collection of pus, as well as other symptoms. Treatment is important to help with symptoms and to prevent the infection from spreading. The general types of dental abscesses are: Pulpal abscess. This abscess may form from the inner part of the tooth (pulp). Periodontal abscess. This abscess may form from the gum. What are the causes? This condition is caused by a bacterial infection in or around the tooth. It may result from: Severe tooth decay (cavities). Trauma to the tooth, such as a broken or chipped tooth. What increases the risk? This condition is more likely to develop in males. It is also more likely to develop in people who: Have cavities. Have severe gum disease. Eat sugary snacks between meals. Use tobacco products. Have diabetes. Have a weakened disease-fighting system (immune system). Do not brush and care for their teeth regularly. What are the signs or symptoms? Mild symptoms of this condition include: Tenderness. Bad breath. Fever. A bitter taste in the mouth. Pain in and around the infected tooth. Moderate symptoms of this condition include: Swollen neck glands. Chills. Pus drainage. Swelling and redness around the infected tooth, in the mouth, or in the face. Severe pain in and around the infected tooth. Severe symptoms of this condition include: Difficulty swallowing. Difficulty opening the mouth. Nausea. Vomiting. How is this diagnosed? This condition is diagnosed based on: Your symptoms and your medical and dental history. An examination of the infected tooth. During the exam, your dental care provider may tap on the infected tooth. You may also need to have X-rays taken of the affected area. How is this treated? This condition  is treated by getting rid of the infection. This may be done with: Antibiotic medicines. These may be used in certain situations. Antibacterial mouth rinse. Incision and drainage. This procedure is done by making an incision in the abscess to drain out the pus. Removing pus is the first priority in treating an abscess. A root canal. This may be performed to save the tooth. Your dental care provider accesses the visible part of your tooth (crown) with a drill and removes any infected pulp. Then the space is filled and sealed off. Tooth extraction. The tooth is pulled out if it cannot be saved by other treatment. You may also receive treatment for pain, such as: Acetaminophen or NSAIDs. Gels that contain a numbing medicine. An injection to block the pain near your nerve. Follow these instructions at home: Medicines Take over-the-counter and prescription medicines only as told by your dental care provider. If you were prescribed an antibiotic, take it as told by your dental care provider. Do not stop taking the antibiotic even if you start to feel better. If you were prescribed a gel that contains a numbing medicine, use it exactly as told in the directions. Do not use these gels for children who are younger than 82 years of age. Use an antibacterial mouth rinse as told by your dental care provider. General instructions  Gargle with a mixture of salt and water 3-4 times a day or as needed. To make salt water, completely dissolve -1 tsp (3-6 g) of salt in 1 cup (237 mL) of warm water. Eat a soft diet while your abscess is healing. Drink enough fluid to keep  your urine pale yellow. Do not apply heat to the outside of your mouth. Do not use any products that contain nicotine or tobacco. These products include cigarettes, chewing tobacco, and vaping devices, such as e-cigarettes. If you need help quitting, ask your dental care provider. Keep all follow-up visits. This is important. How is this  prevented?  Excellent dental home care, which includes brushing your teeth every morning and night with fluoride toothpaste. Floss one time each day. Get regularly scheduled dental cleanings. Consider having a dental sealant applied on teeth that have deep grooves to prevent cavities. Drink fluoridated water regularly. This includes most tap water. Check the label on bottled water to see if it contains fluoride. Reduce or eliminate sugary drinks. Eat healthy meals and snacks. Wear a mouth guard or face shield to protect your teeth while playing sports. Contact a health care provider if: Your pain is worse and is not helped by medicine. You have swelling. You see pus around the tooth. You have a fever or chills. Get help right away if: Your symptoms suddenly get worse. You have a very bad headache. You have problems breathing or swallowing. You have trouble opening your mouth. You have swelling in your neck or around your eye. These symptoms may represent a serious problem that is an emergency. Do not wait to see if the symptoms will go away. Get medical help right away. Call your local emergency services (911 in the [place] Do not drive yourself to the hospital. Summary A dental abscess is a collection of pus in or around a tooth that results from an infection. A dental abscess may result from severe tooth decay, trauma to the tooth, or severe gum disease around a tooth. Symptoms include severe pain, swelling, redness, and drainage of pus in and around the infected tooth. The first priority in treating a dental abscess is to drain out the pus. Treatment may also involve removing damage inside the tooth (root canal) or extracting the tooth. This information is not intended to replace advice given to you by your health care provider. Make sure you discuss any questions you have with your health care provider. Document Revised: 12/02/2020 Document Reviewed: 12/02/2020 Elsevier Patient  Education  [place].     Please schedule a Follow-up Appointment to: Return if symptoms worsen or fail to improve.  If you have any other questions or concerns, please feel free to call the office or send a message through [place]. You may also schedule an earlier appointment if necessary.  Additionally, you may be receiving a survey about your experience at our office within a few days to 1 week by e-mail or mail. We value your feedback.  Nobie Putnam, DO [place]

## 2022-12-15 NOTE — Progress Notes (Signed)
Subjective:    Patient ID: [name], female    DOB: [birth year], 67 y.o.   MRN: [id number]  [name] is a 67 y.o. female presenting on 12/15/2022 for Oral Pain  Patient presents for a same day appointment.  HPI  Dental Abscess S/p dental extraction for all lower teeth previously. She has dentist in [place], she is anticipating receiving dental implants in future. Today here to follow up for dental abscess infection and pain, x 2 sites of gum lower jaw with one spot on each side with pain and inflammation She was advised has sliver of tooth remaining that may have triggered. She has plan to follow-up with dentist and travel back to [place] for re-evaluation next phase of treatment. Denies fever chills nausea vomiting       12/15/2022   10:48 AM 06/30/2022    2:37 PM 06/23/2022    3:14 PM  Depression screen PHQ 2/9  Decreased Interest 1 1   Down, Depressed, Hopeless '1 1 1  '$ PHQ - 2 Score '2 2 1  '$ Altered sleeping '1 2 1  '$ Tired, decreased energy  2 1  Change in appetite 1 0 0  Feeling bad or failure about yourself  0 1 0  Trouble concentrating 0 1 0  Moving slowly or fidgety/restless 0 0 0  Suicidal thoughts 0 0 0  PHQ-9 Score '4 8 3  '$ Difficult doing work/chores Not difficult at all Not difficult at all Not difficult at all    Social History   Tobacco Use   Smoking status: Former    Packs/day: 0.25    Years: 5.00    Total pack years: 1.25    Types: Cigarettes    Quit date: 07/25/2001    Years since quitting: 21.4   Smokeless tobacco: Never  Vaping Use   Vaping Use: Never used  Substance Use Topics   Alcohol use: Yes    Alcohol/week: 0.0 standard drinks of alcohol    Comment: 2 glasses of wine per week   Drug use: No    Review of Systems Per HPI unless specifically indicated above     Objective:    BP 118/80   Pulse 95   Ht '5\' 2"'$  (1.575 m)   Wt 222 lb (100.7 kg)   SpO2 98%   BMI 40.60 kg/m   Wt Readings from Last 3 Encounters:   12/15/22 222 lb (100.7 kg)  12/14/22 185 lb (83.9 kg)  08/12/22 185 lb (83.9 kg)    Physical Exam Vitals and nursing note reviewed.  Constitutional:      General: She is not in acute distress.    Appearance: Normal appearance. She is well-developed. She is not diaphoretic.     Comments: Well-appearing, comfortable, cooperative  HENT:     Head: Normocephalic and atraumatic.     Mouth/Throat:     Comments: Lower jaw without teeth, s/p full extraction. X 2 locations lower molar region 1 on each side with some local inflammation slight purulence. Eyes:     General:        Right eye: No discharge.        Left eye: No discharge.     Conjunctiva/sclera: Conjunctivae normal.  Cardiovascular:     Rate and Rhythm: Normal rate.  Pulmonary:     Effort: Pulmonary effort is normal.  Skin:    General: Skin is warm and dry.     Findings: No erythema or rash.  Neurological:     Mental  Status: She is alert and oriented to person, place, and time.  Psychiatric:        Mood and Affect: Mood normal.        Behavior: Behavior normal.        Thought Content: Thought content normal.     Comments: Well groomed, good eye contact, normal speech and thoughts    Results for orders placed or performed in visit on 06/22/22  Vitamin B12  Result Value Ref Range   Vitamin B-12 475 200 - 1,100 pg/mL  VITAMIN D 25 Hydroxy (Vit-D Deficiency, Fractures)  Result Value Ref Range   Vit D, 25-Hydroxy 52 30 - 100 ng/mL  TSH  Result Value Ref Range   TSH 2.71 0.40 - 4.50 mIU/L  Hemoglobin A1c  Result Value Ref Range   Hgb A1c MFr Bld 5.0 <5.7 % of total Hgb   Mean Plasma Glucose 97 mg/dL   eAG (mmol/L) 5.4 mmol/L  Lipid panel  Result Value Ref Range   Cholesterol 168 <200 mg/dL   HDL 74 > OR = 50 mg/dL   Triglycerides 114 <150 mg/dL   LDL Cholesterol (Calc) 74 mg/dL (calc)   Total CHOL/HDL Ratio 2.3 <5.0 (calc)   Non-HDL Cholesterol (Calc) 94 <130 mg/dL (calc)  CBC with Differential/Platelet  Result  Value Ref Range   WBC 9.6 3.8 - 10.8 Thousand/uL   RBC 3.94 3.80 - 5.10 Million/uL   Hemoglobin 12.7 11.7 - 15.5 g/dL   HCT 38.3 35.0 - 45.0 %   MCV 97.2 80.0 - 100.0 fL   MCH 32.2 27.0 - 33.0 pg   MCHC 33.2 32.0 - 36.0 g/dL   RDW 12.1 11.0 - 15.0 %   Platelets 331 140 - 400 Thousand/uL   MPV 9.1 7.5 - 12.5 fL   Neutro Abs 4,358 1,500 - 7,800 cells/uL   Lymphs Abs 3,773 850 - 3,900 cells/uL   Absolute Monocytes 710 200 - 950 cells/uL   Eosinophils Absolute 672 (H) 15 - 500 cells/uL   Basophils Absolute 86 0 - 200 cells/uL   Neutrophils Relative % 45.4 %   Total Lymphocyte 39.3 %   Monocytes Relative 7.4 %   Eosinophils Relative 7.0 %   Basophils Relative 0.9 %  COMPLETE METABOLIC PANEL WITH GFR  Result Value Ref Range   Glucose, Bld 79 65 - 99 mg/dL   BUN 20 7 - 25 mg/dL   Creat 1.03 0.50 - 1.05 mg/dL   eGFR 60 > OR = 60 mL/min/1.63m   BUN/Creatinine Ratio SEE NOTE: 6 - 22 (calc)   Sodium 143 135 - 146 mmol/L   Potassium 4.0 3.5 - 5.3 mmol/L   Chloride 107 98 - 110 mmol/L   CO2 30 20 - 32 mmol/L   Calcium 8.7 8.6 - 10.4 mg/dL   Total Protein 5.9 (L) 6.1 - 8.1 g/dL   Albumin 3.8 3.6 - 5.1 g/dL   Globulin 2.1 1.9 - 3.7 g/dL (calc)   AG Ratio 1.8 1.0 - 2.5 (calc)   Total Bilirubin 0.5 0.2 - 1.2 mg/dL   Alkaline phosphatase (APISO) 79 37 - 153 U/L   AST 13 10 - 35 U/L   ALT 12 6 - 29 U/L      Assessment & Plan:   Problem List Items Addressed This Visit   None Visit Diagnoses     Dental abscess    -  Primary   Relevant Medications   amoxicillin-clavulanate (AUGMENTIN) 875-125 MG tablet       Suspected  dental infection of gum due to prior dental extraction potential retained tooth fragment Without evidence of systemic symptoms. Afebrile  Plan - Start empiric Augmentin antibiotic BID for 10 days - OTC analgesia as needed - Return to Dentist as planned soon   Meds ordered this encounter  Medications   amoxicillin-clavulanate (AUGMENTIN) 875-125 MG tablet     Sig: Take 1 tablet by mouth 2 (two) times daily. For 10 days    Dispense:  20 tablet    Refill:  0    Follow up plan: Return if symptoms worsen or fail to improve.   Nobie Putnam, [facility] 12/15/2022, 10:30 AM

## 2023-01-22 DIAGNOSIS — M9903 Segmental and somatic dysfunction of lumbar region: Secondary | ICD-10-CM | POA: Diagnosis not present

## 2023-01-22 DIAGNOSIS — M9904 Segmental and somatic dysfunction of sacral region: Secondary | ICD-10-CM | POA: Diagnosis not present

## 2023-01-22 DIAGNOSIS — M62830 Muscle spasm of back: Secondary | ICD-10-CM | POA: Diagnosis not present

## 2023-01-22 DIAGNOSIS — M5416 Radiculopathy, lumbar region: Secondary | ICD-10-CM | POA: Diagnosis not present

## 2023-01-24 DIAGNOSIS — M9903 Segmental and somatic dysfunction of lumbar region: Secondary | ICD-10-CM | POA: Diagnosis not present

## 2023-01-24 DIAGNOSIS — M62830 Muscle spasm of back: Secondary | ICD-10-CM | POA: Diagnosis not present

## 2023-01-24 DIAGNOSIS — M5416 Radiculopathy, lumbar region: Secondary | ICD-10-CM | POA: Diagnosis not present

## 2023-01-24 DIAGNOSIS — M9904 Segmental and somatic dysfunction of sacral region: Secondary | ICD-10-CM | POA: Diagnosis not present

## 2023-02-02 DIAGNOSIS — M1711 Unilateral primary osteoarthritis, right knee: Secondary | ICD-10-CM | POA: Diagnosis not present

## 2023-03-13 ENCOUNTER — Other Ambulatory Visit: Payer: Self-pay | Admitting: Family Medicine

## 2023-03-13 DIAGNOSIS — R921 Mammographic calcification found on diagnostic imaging of breast: Secondary | ICD-10-CM

## 2023-04-06 DIAGNOSIS — Z0189 Encounter for other specified special examinations: Secondary | ICD-10-CM | POA: Diagnosis not present

## 2023-04-06 DIAGNOSIS — M1711 Unilateral primary osteoarthritis, right knee: Secondary | ICD-10-CM | POA: Diagnosis not present

## 2023-04-09 ENCOUNTER — Inpatient Hospital Stay: Admission: RE | Admit: 2023-04-09 | Payer: Medicare HMO | Source: Ambulatory Visit

## 2023-04-25 DIAGNOSIS — R262 Difficulty in walking, not elsewhere classified: Secondary | ICD-10-CM | POA: Diagnosis not present

## 2023-04-25 DIAGNOSIS — M25561 Pain in right knee: Secondary | ICD-10-CM | POA: Diagnosis not present

## 2023-05-02 ENCOUNTER — Other Ambulatory Visit: Payer: Self-pay

## 2023-05-02 DIAGNOSIS — Z0189 Encounter for other specified special examinations: Secondary | ICD-10-CM | POA: Diagnosis not present

## 2023-05-02 DIAGNOSIS — F3341 Major depressive disorder, recurrent, in partial remission: Secondary | ICD-10-CM

## 2023-05-02 DIAGNOSIS — Z01812 Encounter for preprocedural laboratory examination: Secondary | ICD-10-CM | POA: Diagnosis not present

## 2023-05-02 MED ORDER — VENLAFAXINE HCL ER 150 MG PO CP24: 150.0000 mg | ORAL_CAPSULE | Freq: Every day | ORAL | 1 refills | Status: DC

## 2023-05-04 DIAGNOSIS — M1711 Unilateral primary osteoarthritis, right knee: Secondary | ICD-10-CM | POA: Diagnosis not present

## 2023-05-09 DIAGNOSIS — G8918 Other acute postprocedural pain: Secondary | ICD-10-CM | POA: Diagnosis not present

## 2023-05-09 DIAGNOSIS — M1711 Unilateral primary osteoarthritis, right knee: Secondary | ICD-10-CM | POA: Diagnosis not present

## 2023-05-10 ENCOUNTER — Other Ambulatory Visit: Payer: Self-pay | Admitting: Family Medicine

## 2023-05-10 DIAGNOSIS — R262 Difficulty in walking, not elsewhere classified: Secondary | ICD-10-CM | POA: Diagnosis not present

## 2023-05-10 DIAGNOSIS — F419 Anxiety disorder, unspecified: Secondary | ICD-10-CM

## 2023-05-10 DIAGNOSIS — M25561 Pain in right knee: Secondary | ICD-10-CM | POA: Diagnosis not present

## 2023-05-10 NOTE — Telephone Encounter (Signed)
Requested by interface surescripts.  Requested Prescriptions  Pending Prescriptions Disp Refills   busPIRone (BUSPAR) 5 MG tablet [Pharmacy Med Name: BUSPIRONE HYDROCHLORIDE 5 MG Tablet] 270 tablet 0    Sig: TAKE 1 TABLET THREE TIMES DAILY AS NEEDED FOR ANXIETY     Psychiatry: Anxiolytics/Hypnotics - Non-controlled Passed - 05/10/2023  3:08 AM      Passed - Valid encounter within last 12 months    Recent Outpatient Visits           4 months ago Dental abscess   [place] [facility] [place], Netta Neat, DO   10 months ago Annual physical exam   [place] [facility] Smitty Cords, DO   1 year ago Pure hypercholesterolemia   [place] [facility] Smitty Cords, DO   1 year ago Annual physical exam   [place] [facility] Smitty Cords, DO   1 year ago Neck pain on left side   [place] [facility] Smitty Cords, DO       Future Appointments             In 7 months Christia Reading, Lindalou Hose, NP [facility]

## 2023-05-16 DIAGNOSIS — R262 Difficulty in walking, not elsewhere classified: Secondary | ICD-10-CM | POA: Diagnosis not present

## 2023-05-16 DIAGNOSIS — M25561 Pain in right knee: Secondary | ICD-10-CM | POA: Diagnosis not present

## 2023-05-18 DIAGNOSIS — M25561 Pain in right knee: Secondary | ICD-10-CM | POA: Diagnosis not present

## 2023-05-18 DIAGNOSIS — R262 Difficulty in walking, not elsewhere classified: Secondary | ICD-10-CM | POA: Diagnosis not present

## 2023-05-23 DIAGNOSIS — M25561 Pain in right knee: Secondary | ICD-10-CM | POA: Diagnosis not present

## 2023-05-23 DIAGNOSIS — R262 Difficulty in walking, not elsewhere classified: Secondary | ICD-10-CM | POA: Diagnosis not present

## 2023-06-07 DIAGNOSIS — R262 Difficulty in walking, not elsewhere classified: Secondary | ICD-10-CM | POA: Diagnosis not present

## 2023-06-07 DIAGNOSIS — M25561 Pain in right knee: Secondary | ICD-10-CM | POA: Diagnosis not present

## 2023-06-12 DIAGNOSIS — R262 Difficulty in walking, not elsewhere classified: Secondary | ICD-10-CM | POA: Diagnosis not present

## 2023-06-12 DIAGNOSIS — M25561 Pain in right knee: Secondary | ICD-10-CM | POA: Diagnosis not present

## 2023-06-15 DIAGNOSIS — R262 Difficulty in walking, not elsewhere classified: Secondary | ICD-10-CM | POA: Diagnosis not present

## 2023-06-15 DIAGNOSIS — M25561 Pain in right knee: Secondary | ICD-10-CM | POA: Diagnosis not present

## 2023-06-18 DIAGNOSIS — Z96651 Presence of right artificial knee joint: Secondary | ICD-10-CM | POA: Diagnosis not present

## 2023-06-27 ENCOUNTER — Other Ambulatory Visit: Payer: Self-pay | Admitting: Family Medicine

## 2023-06-27 ENCOUNTER — Ambulatory Visit
Admission: RE | Admit: 2023-06-27 | Discharge: 2023-06-27 | Disposition: A | Discharge status: Home / Self Care | Payer: Medicare HMO | Source: Ambulatory Visit | Attending: Family Medicine | Admitting: Family Medicine

## 2023-06-27 DIAGNOSIS — Z1231 Encounter for screening mammogram for malignant neoplasm of breast: Secondary | ICD-10-CM

## 2023-06-27 DIAGNOSIS — R92333 Mammographic heterogeneous density, bilateral breasts: Secondary | ICD-10-CM | POA: Diagnosis not present

## 2023-06-27 DIAGNOSIS — R921 Mammographic calcification found on diagnostic imaging of breast: Secondary | ICD-10-CM

## 2023-06-27 DIAGNOSIS — R928 Other abnormal and inconclusive findings on diagnostic imaging of breast: Secondary | ICD-10-CM | POA: Diagnosis not present

## 2023-06-29 NOTE — Progress Notes (Deleted)
Subjective:   [name] is a 67 y.o. female who presents for Medicare Annual (Subsequent) preventive examination.  Visit Complete: {VISITMETHOD:8163040691}  Patient Medicare AWV questionnaire was completed by the patient on ***; I have confirmed that all information answered by patient is correct and no changes since this date.        Objective:    There were no vitals filed for this visit. There is no height or weight on file to calculate BMI.     08/12/2022    8:16 AM 06/25/2022   10:11 AM 06/23/2022    3:16 PM 06/15/2022    9:01 AM 04/24/2022    5:36 PM 04/24/2022    2:43 PM 04/24/2022   10:35 AM  Advanced Directives  Does Patient Have a Medical Advance Directive? No No No No No No No  Would patient like information on creating a medical advance directive?   No - Patient declined  No - Patient declined No - Patient declined     Current Medications (verified) Outpatient Encounter Medications as of 06/29/2023  Medication Sig   amoxicillin-clavulanate (AUGMENTIN) 875-125 MG tablet Take 1 tablet by mouth 2 (two) times daily. For 10 days   B Complex Vitamins (VITAMIN-B COMPLEX PO) Take by mouth daily.   baclofen (LIORESAL) 10 MG tablet Take 1 tablet (10 mg total) by mouth 3 (three) times daily.   busPIRone (BUSPAR) 5 MG tablet TAKE 1 TABLET THREE TIMES DAILY AS NEEDED FOR ANXIETY   hydrOXYzine (ATARAX) 25 MG tablet Take 1 tablet (25 mg total) by mouth every 6 (six) hours as needed for itching.   ibuprofen (ADVIL) 600 MG tablet Take 1 tablet (600 mg total) by mouth every 6 (six) hours as needed.   lamoTRIgine (LAMICTAL) 100 MG tablet Take 1 tablet (100 mg total) by mouth 2 (two) times daily.   Melatonin 3 MG TABS Take 6 mg by mouth at bedtime.    Multiple Vitamin (MULTIVITAMIN WITH MINERALS) TABS Take 1 tablet by mouth daily.    Omega-3 Fatty Acids (FISH OIL) 1200 MG CAPS Take 1 capsule by mouth daily.   rosuvastatin (CRESTOR) 10 MG tablet TAKE 1 TABLET AT BEDTIME    triamcinolone ointment (KENALOG) 0.1 % Apply 1 Application topically 2 (two) times daily.   venlafaxine XR (EFFEXOR-XR) 150 MG 24 hr capsule Take 1 capsule (150 mg total) by mouth daily at 6 (six) AM.   No facility-administered encounter medications on file as of 06/29/2023.    Allergies (verified) Patient has no known allergies.   History: Past Medical History:  Diagnosis Date   Allergy    Anxiety    Arthritis    left hip   Blood transfusion without reported diagnosis    with hip surgery    GERD (gastroesophageal reflux disease)    Hepatitis 1972   hep B   Hyperlipidemia    controlled on meds    Memory disorder 09/21/2017   OSA (obstructive sleep apnea) 05/08/2018   PONV (postoperative nausea and vomiting)    Seizures (HCC)    last seizure 11-2016   Sleep apnea    no cpap    Stroke [facility])    per pt    Past Surgical History:  Procedure Laterality Date   ABDOMINAL HYSTERECTOMY  04/1999   ARTHROSCOPIC REPAIR ACL     bilateral knees   BREAST EXCISIONAL BIOPSY Right 2008   NEG   BREAST EXCISIONAL BIOPSY Left YRS AGO   X2 - NEG   BREAST  LUMPECTOMY  06/1979; 03/1995   left   CHOLECYSTECTOMY  09/1987   COLONOSCOPY     LAPAROSCOPIC GASTRIC SLEEVE RESECTION  09/30/2012   Procedure: LAPAROSCOPIC GASTRIC SLEEVE RESECTION;  Surgeon: Lodema Pilot, DO;  Location: [facility];  Service: General;  Laterality: N/A;   POLYPECTOMY     TOTAL HIP ARTHROPLASTY  11/13/2012   left   UPPER GASTROINTESTINAL ENDOSCOPY     UPPER GI ENDOSCOPY  09/30/2012   Procedure: UPPER GI ENDOSCOPY;  Surgeon: Lodema Pilot, DO;  Location: [facility];  Service: General;;   XI ROBOTIC LAPAROSCOPIC ASSISTED APPENDECTOMY N/A 04/24/2022   Procedure: XI ROBOTIC LAPAROSCOPIC ASSISTED APPENDECTOMY;  Surgeon: Carolan Shiver, MD;  Location: [facility] ORS;  Service: General;  Laterality: N/A;   Family History  Problem Relation Age of Onset   Hyperlipidemia Mother    Obesity Mother    Pulmonary fibrosis Mother    Cancer  Maternal Aunt        ovarian cancer   Cancer Father    Prostate cancer Father    Colon cancer Paternal Grandmother    Esophageal cancer Neg Hx    Stomach cancer Neg Hx    Rectal cancer Neg Hx    Breast cancer Neg Hx    Colon polyps Neg Hx    Social History   Socioeconomic History   Marital status: Married    Spouse name: Not on file   Number of children: 0   Years of education: BS   Highest education level: Not on file  Occupational History   Not on file  Tobacco Use   Smoking status: Former    Current packs/day: 0.00    Average packs/day: 0.3 packs/day for 5.0 years (1.3 ttl pk-yrs)    Types: Cigarettes    Start date: 07/25/1996    Quit date: 07/25/2001    Years since quitting: 21.9   Smokeless tobacco: Never  Vaping Use   Vaping status: Never Used  Substance and Sexual Activity   Alcohol use: Yes    Alcohol/week: 0.0 standard drinks of alcohol    Comment: 2 glasses of wine per week   Drug use: No   Sexual activity: Not on file  Other Topics Concern   Not on file  Social History Narrative   Lives with spouse   Caffeine use: Drinks coffee daily   Right handed   Social Determinants of Health   Financial Resource Strain: Low Risk  (06/23/2022)   Overall Financial Resource Strain (CARDIA)    Difficulty of Paying Living Expenses: Not very hard  Food Insecurity: No Food Insecurity (06/23/2022)   Hunger Vital Sign    Worried About Running Out of Food in the Last Year: Never true    Ran Out of Food in the Last Year: Never true  Transportation Needs: No Transportation Needs (06/23/2022)   PRAPARE - Administrator, Civil Service (Medical): No    Lack of Transportation (Non-Medical): No  Physical Activity: Sufficiently Active (06/23/2022)   Exercise Vital Sign    Days of Exercise per Week: 4 days    Minutes of Exercise per Session: 40 min  Stress: No Stress Concern Present (06/23/2022)   [employer] of Occupational Health - Occupational Stress  Questionnaire    Feeling of Stress : Only a little  Social Connections: Moderately Integrated (06/23/2022)   Social Connection and Isolation Panel [NHANES]    Frequency of Communication with Friends and Family: More than three times a week    Frequency of  Social Gatherings with Friends and Family: Once a week    Attends Religious Services: More than 4 times per year    Active Member of [employer] or Organizations: No    Attends Banker Meetings: Never    Marital Status: Married    Tobacco Counseling Counseling given: Not Answered   Clinical Intake:                        Activities of Daily Living     No data to display          Patient Care Team: Smitty Cords, DO as PCP - General (Family Medicine) Himmelrich, Loree Fee, RD (Inactive) as Dietitian (Bariatrics)  Indicate any recent Medical Services you may have received from other than [facility] providers in the past year (date may be approximate).     Assessment:   This is a routine wellness examination for [place].  Hearing/Vision screen No results found.   Goals Addressed   None    Depression Screen    12/15/2022   10:48 AM 06/30/2022    2:37 PM 06/23/2022    3:14 PM 12/21/2021   10:34 AM 06/23/2021    9:45 AM 04/19/2021    1:22 PM 03/08/2021    2:50 PM  PHQ 2/9 Scores  PHQ - 2 Score 2 2 1  0 2 2 4   PHQ- 9 Score 4 8 3 1 4 9 11     Fall Risk    12/15/2022   10:48 AM 06/30/2022    2:37 PM 06/23/2022    3:17 PM 12/21/2021   10:34 AM 06/23/2021    9:45 AM  Fall Risk   Falls in the past year? 0 1 0 0 0  Number falls in past yr: 0 0 0 0 0  Injury with Fall? 0 0 0 0 0  Risk for fall due to : No Fall Risks No Fall Risks No Fall Risks No Fall Risks   Follow up Falls evaluation completed Falls evaluation completed Falls prevention discussed Falls evaluation completed Falls evaluation completed    MEDICARE RISK AT HOME:    TIMED UP AND GO:  Was the test performed?  {AMBTIMEDUPGO:[id number]}     Cognitive Function:        06/23/2022    3:18 PM 04/19/2021    1:25 PM  6CIT Screen  What Year? 0 points 0 points  What month? 0 points 0 points  What time? 0 points 0 points  Count back from 20 0 points 0 points  Months in reverse 0 points 0 points  Repeat phrase 0 points 4 points  Total Score 0 points 4 points    Immunizations Immunization History  Administered Date(s) Administered   Fluad Quad(high Dose 65+) 06/23/2021   Influenza Inj Mdck Quad Pf 07/29/2018   Influenza Split 06/20/2012   Influenza,inj,Quad PF,6+ Mos 06/20/2019, 06/21/2020   Influenza-Unspecified 10/17/2016, 07/31/2017, 07/29/2018, 06/09/2022   PFIZER(Purple Top)SARS-COV-2 Vaccination 12/09/2019, 12/31/2019, 07/19/2020, 01/21/2021   Td 04/28/2011   Zoster Recombinant(Shingrix) 07/11/2021, 10/18/2021    {TDAP status:2101805}  {Flu Vaccine status:2101806}  {Pneumococcal vaccine status:2101807}  {Covid-19 vaccine status:2101808}  Qualifies for Shingles Vaccine? {YES/NO:21197}  Zostavax completed {YES/NO:21197}  {Shingrix Completed?:2101804}  Screening Tests Health Maintenance  Topic Date Due   DTaP/Tdap/Td (2 - Tdap) 04/27/2021   Pneumonia Vaccine 15+ Years old (1 of 1 - PCV) Never done   DEXA SCAN  Never done   INFLUENZA VACCINE  05/10/2023   COVID-19 Vaccine (  5 - 2023-24 season) 06/10/2023   Medicare Annual Wellness (AWV)  06/24/2023   MAMMOGRAM  06/26/2025   Colonoscopy  03/16/2028   Hepatitis C Screening  Completed   Zoster Vaccines- Shingrix  Completed   HPV VACCINES  Aged Out    Health Maintenance  Health Maintenance Due  Topic Date Due   DTaP/Tdap/Td (2 - Tdap) 04/27/2021   Pneumonia Vaccine 82+ Years old (1 of 1 - PCV) Never done   DEXA SCAN  Never done   INFLUENZA VACCINE  05/10/2023   COVID-19 Vaccine (5 - 2023-24 season) 06/10/2023   Medicare Annual Wellness (AWV)  06/24/2023    {Colorectal cancer screening:2101809}  {Mammogram status:21018020}  {Bone Density  status:21018021}  Lung Cancer Screening: (Low Dose CT Chest recommended if Age 46-80 years, 20 pack-year currently smoking OR have quit w/in 15years.) {DOES NOT does:27190::"does not"} qualify.   Lung Cancer Screening Referral: ***  Additional Screening:  Hepatitis C Screening: {DOES NOT does:27190::"does not"} qualify; Completed ***  Vision Screening: Recommended annual ophthalmology exams for early detection of glaucoma and other disorders of the eye. Is the patient up to date with their annual eye exam?  {YES/NO:21197} Who is the provider or what is the name of the office in which the patient attends annual eye exams? *** If pt is not established with a provider, would they like to be referred to a provider to establish care? {YES/NO:21197}.   Dental Screening: Recommended annual dental exams for proper oral hygiene  Diabetic Foot Exam: {Diabetic Foot Exam:2101802}  Community Resource Referral / Chronic Care Management: CRR required this visit?  {YES/NO:21197}  CCM required this visit?  {CCM Required choices:5853265987}     Plan:     I have personally reviewed and noted the following in the patient's chart:   Medical and social history Use of alcohol, tobacco or illicit drugs  Current medications and supplements including opioid prescriptions. {Opioid Prescriptions:[phone]} Functional ability and status Nutritional status Physical activity Advanced directives List of other physicians Hospitalizations, surgeries, and ER visits in previous 12 months Vitals Screenings to include cognitive, depression, and falls Referrals and appointments  In addition, I have reviewed and discussed with patient certain preventive protocols, quality metrics, and best practice recommendations. A written personalized care plan for preventive services as well as general preventive health recommendations were provided to patient.     Hal Hope, LPN   2/95/6213   After Visit  Summary: {CHL AMB AWV After Visit [phone]}  Nurse Notes: ***

## 2023-06-29 NOTE — Progress Notes (Signed)
This encounter was created in error - please disregard.

## 2023-07-05 ENCOUNTER — Ambulatory Visit
Admission: RE | Admit: 2023-07-05 | Discharge: 2023-07-05 | Disposition: A | Discharge status: Home / Self Care | Payer: Medicare HMO | Source: Ambulatory Visit | Attending: Family Medicine | Admitting: Family Medicine

## 2023-07-05 DIAGNOSIS — N6489 Other specified disorders of breast: Secondary | ICD-10-CM | POA: Diagnosis not present

## 2023-07-05 DIAGNOSIS — Z1231 Encounter for screening mammogram for malignant neoplasm of breast: Secondary | ICD-10-CM

## 2023-07-05 DIAGNOSIS — R921 Mammographic calcification found on diagnostic imaging of breast: Secondary | ICD-10-CM

## 2023-07-05 HISTORY — PX: BREAST BIOPSY: SHX20

## 2023-07-05 MED ORDER — LIDOCAINE-EPINEPHRINE 1 %-1:100000 IJ SOLN
20.0000 mL | Freq: Once | INTRAMUSCULAR | Status: AC
  Administered 2023-07-05: 10 mL
  Filled 2023-07-05: qty 20

## 2023-07-05 MED ORDER — LIDOCAINE 1 % OPTIME INJ - NO CHARGE
5.0000 mL | Freq: Once | INTRAMUSCULAR | Status: AC
  Administered 2023-07-05: 5 mL
  Filled 2023-07-05: qty 6

## 2023-07-06 LAB — SURGICAL PATHOLOGY

## 2023-07-13 ENCOUNTER — Other Ambulatory Visit: Payer: Self-pay

## 2023-07-13 DIAGNOSIS — E7800 Pure hypercholesterolemia, unspecified: Secondary | ICD-10-CM

## 2023-07-13 MED ORDER — ROSUVASTATIN CALCIUM 10 MG PO TABS
10.0000 mg | ORAL_TABLET | Freq: Every day | ORAL | 0 refills | Status: DC
Start: 2023-07-13 — End: 2023-08-21

## 2023-07-22 ENCOUNTER — Other Ambulatory Visit: Payer: Self-pay | Admitting: Family Medicine

## 2023-07-22 DIAGNOSIS — F419 Anxiety disorder, unspecified: Secondary | ICD-10-CM

## 2023-07-23 NOTE — Telephone Encounter (Signed)
Requested Prescriptions  Pending Prescriptions Disp Refills   busPIRone (BUSPAR) 5 MG tablet [Pharmacy Med Name: busPIRone HCl Oral Tablet 5 MG] 270 tablet 0    Sig: TAKE 1 TABLET THREE TIMES DAILY AS NEEDED FOR ANXIETY     Psychiatry: Anxiolytics/Hypnotics - Non-controlled Passed - 07/22/2023  8:33 AM      Passed - Valid encounter within last 12 months    Recent Outpatient Visits           7 months ago Dental abscess   [place] [facility] Smitty Cords, DO   1 year ago Annual physical exam   [place] [facility] Smitty Cords, DO   1 year ago Pure hypercholesterolemia   [place] [facility] Smitty Cords, DO   2 years ago Annual physical exam   [place] [facility] Smitty Cords, DO   2 years ago Neck pain on left side   [place] [facility] [place], Netta Neat, DO       Future Appointments             In 4 weeks Althea Charon, Netta Neat, DO  [facility] [place]   In 4 months Christia Reading, Lindalou Hose, NP [facility]

## 2023-08-15 ENCOUNTER — Telehealth: Payer: Self-pay

## 2023-08-15 ENCOUNTER — Other Ambulatory Visit: Payer: Self-pay

## 2023-08-15 DIAGNOSIS — E669 Obesity, unspecified: Secondary | ICD-10-CM

## 2023-08-15 DIAGNOSIS — Z0000 Encounter for general adult medical examination without abnormal findings: Secondary | ICD-10-CM

## 2023-08-15 DIAGNOSIS — E559 Vitamin D deficiency, unspecified: Secondary | ICD-10-CM

## 2023-08-15 DIAGNOSIS — R7309 Other abnormal glucose: Secondary | ICD-10-CM

## 2023-08-15 DIAGNOSIS — E538 Deficiency of other specified B group vitamins: Secondary | ICD-10-CM

## 2023-08-15 DIAGNOSIS — E7800 Pure hypercholesterolemia, unspecified: Secondary | ICD-10-CM

## 2023-08-15 NOTE — Telephone Encounter (Signed)
All lab orders are in for 11/7  Thank you!

## 2023-08-15 NOTE — Telephone Encounter (Signed)
Copied from CRM [id number]. Topic: General - Other >> Aug 15, 2023  1:29 PM [name] wrote: Reason for CRM: Pt is calling in because she scheduled a lab appointment for 11*07 and wanted to make sure the orders were in. The labs are for her physical on 11*12

## 2023-08-16 ENCOUNTER — Other Ambulatory Visit: Payer: Medicare HMO

## 2023-08-16 DIAGNOSIS — E669 Obesity, unspecified: Secondary | ICD-10-CM | POA: Diagnosis not present

## 2023-08-16 DIAGNOSIS — Z0000 Encounter for general adult medical examination without abnormal findings: Secondary | ICD-10-CM | POA: Diagnosis not present

## 2023-08-16 DIAGNOSIS — R7309 Other abnormal glucose: Secondary | ICD-10-CM | POA: Diagnosis not present

## 2023-08-16 DIAGNOSIS — E7800 Pure hypercholesterolemia, unspecified: Secondary | ICD-10-CM | POA: Diagnosis not present

## 2023-08-16 DIAGNOSIS — E559 Vitamin D deficiency, unspecified: Secondary | ICD-10-CM | POA: Diagnosis not present

## 2023-08-16 DIAGNOSIS — E538 Deficiency of other specified B group vitamins: Secondary | ICD-10-CM | POA: Diagnosis not present

## 2023-08-17 LAB — CBC WITH DIFFERENTIAL/PLATELET
Absolute Lymphocytes: 2587 {cells}/uL (ref 850–3900)
Absolute Monocytes: 508 {cells}/uL (ref 200–950)
Basophils Absolute: 77 {cells}/uL (ref 0–200)
Basophils Relative: 1 %
Eosinophils Absolute: 601 {cells}/uL — ABNORMAL HIGH (ref 15–500)
Eosinophils Relative: 7.8 %
HCT: 37.8 % (ref 35.0–45.0)
Hemoglobin: 12.2 g/dL (ref 11.7–15.5)
MCH: 31.7 pg (ref 27.0–33.0)
MCHC: 32.3 g/dL (ref 32.0–36.0)
MCV: 98.2 fL (ref 80.0–100.0)
MPV: 9 fL (ref 7.5–12.5)
Monocytes Relative: 6.6 %
Neutro Abs: 3927 {cells}/uL (ref 1500–7800)
Neutrophils Relative %: 51 %
Platelets: 338 10*3/uL (ref 140–400)
RBC: 3.85 10*6/uL (ref 3.80–5.10)
RDW: 12.4 % (ref 11.0–15.0)
Total Lymphocyte: 33.6 %
WBC: 7.7 10*3/uL (ref 3.8–10.8)

## 2023-08-17 LAB — COMPLETE METABOLIC PANEL WITH GFR
AG Ratio: 1.7 (calc) (ref 1.0–2.5)
ALT: 16 U/L (ref 6–29)
AST: 18 U/L (ref 10–35)
Albumin: 3.8 g/dL (ref 3.6–5.1)
Alkaline phosphatase (APISO): 94 U/L (ref 37–153)
BUN: 13 mg/dL (ref 7–25)
CO2: 28 mmol/L (ref 20–32)
Calcium: 8.9 mg/dL (ref 8.6–10.4)
Chloride: 107 mmol/L (ref 98–110)
Creat: 1.05 mg/dL (ref 0.50–1.05)
Globulin: 2.2 g/dL (ref 1.9–3.7)
Glucose, Bld: 101 mg/dL (ref 65–139)
Potassium: 4.2 mmol/L (ref 3.5–5.3)
Sodium: 143 mmol/L (ref 135–146)
Total Bilirubin: 0.3 mg/dL (ref 0.2–1.2)
Total Protein: 6 g/dL — ABNORMAL LOW (ref 6.1–8.1)
eGFR: 59 mL/min/{1.73_m2} — ABNORMAL LOW (ref >=60)

## 2023-08-17 LAB — LIPID PANEL
Cholesterol: 161 mg/dL (ref <200)
HDL: 70 mg/dL (ref >=50)
LDL Cholesterol (Calc): 73 mg/dL
Non-HDL Cholesterol (Calc): 91 mg/dL (ref <130)
Total CHOL/HDL Ratio: 2.3 (calc) (ref <5.0)
Triglycerides: 94 mg/dL (ref <150)

## 2023-08-17 LAB — VITAMIN B12: Vitamin B-12: 485 pg/mL (ref 200–1100)

## 2023-08-17 LAB — HEMOGLOBIN A1C
Hgb A1c MFr Bld: 5.3 %{Hb} (ref <5.7)
Mean Plasma Glucose: 105 mg/dL
eAG (mmol/L): 5.8 mmol/L

## 2023-08-17 LAB — TSH: TSH: 2.71 m[IU]/L (ref 0.40–4.50)

## 2023-08-17 LAB — VITAMIN D 25 HYDROXY (VIT D DEFICIENCY, FRACTURES): Vit D, 25-Hydroxy: 53 ng/mL (ref 30–100)

## 2023-08-21 ENCOUNTER — Encounter: Payer: Self-pay | Admitting: Family Medicine

## 2023-08-21 ENCOUNTER — Ambulatory Visit (INDEPENDENT_AMBULATORY_CARE_PROVIDER_SITE_OTHER): Payer: Medicare HMO | Admitting: Family Medicine

## 2023-08-21 ENCOUNTER — Other Ambulatory Visit (HOSPITAL_COMMUNITY)
Admission: RE | Admit: 2023-08-21 | Discharge: 2023-08-21 | Disposition: A | Discharge status: Home / Self Care | Payer: Medicare HMO | Source: Ambulatory Visit | Attending: Family Medicine | Admitting: Family Medicine

## 2023-08-21 VITALS — BP 110/84 | Ht 62.0 in | Wt 218.0 lb

## 2023-08-21 DIAGNOSIS — E7800 Pure hypercholesterolemia, unspecified: Secondary | ICD-10-CM | POA: Diagnosis not present

## 2023-08-21 DIAGNOSIS — E669 Obesity, unspecified: Secondary | ICD-10-CM | POA: Diagnosis not present

## 2023-08-21 DIAGNOSIS — R569 Unspecified convulsions: Secondary | ICD-10-CM | POA: Diagnosis not present

## 2023-08-21 DIAGNOSIS — F3341 Major depressive disorder, recurrent, in partial remission: Secondary | ICD-10-CM

## 2023-08-21 DIAGNOSIS — Z0000 Encounter for general adult medical examination without abnormal findings: Secondary | ICD-10-CM

## 2023-08-21 DIAGNOSIS — B3731 Acute candidiasis of vulva and vagina: Secondary | ICD-10-CM | POA: Insufficient documentation

## 2023-08-21 DIAGNOSIS — I6930 Unspecified sequelae of cerebral infarction: Secondary | ICD-10-CM | POA: Diagnosis not present

## 2023-08-21 DIAGNOSIS — F419 Anxiety disorder, unspecified: Secondary | ICD-10-CM | POA: Diagnosis not present

## 2023-08-21 DIAGNOSIS — Z780 Asymptomatic menopausal state: Secondary | ICD-10-CM | POA: Diagnosis not present

## 2023-08-21 MED ORDER — VENLAFAXINE HCL ER 150 MG PO CP24
150.0000 mg | ORAL_CAPSULE | Freq: Every day | ORAL | 3 refills | Status: DC
Start: 2023-08-21 — End: 2024-08-27

## 2023-08-21 MED ORDER — FLUCONAZOLE 150 MG PO TABS
ORAL_TABLET | ORAL | 1 refills | Status: DC
Start: 2023-08-21 — End: 2023-11-30

## 2023-08-21 MED ORDER — ROSUVASTATIN CALCIUM 10 MG PO TABS
10.0000 mg | ORAL_TABLET | Freq: Every day | ORAL | 3 refills | Status: DC
Start: 2023-08-21 — End: 2024-08-27

## 2023-08-21 MED ORDER — BUSPIRONE HCL 5 MG PO TABS
5.0000 mg | ORAL_TABLET | Freq: Three times a day (TID) | ORAL | 3 refills | Status: DC | PRN
Start: 2023-08-21 — End: 2024-08-27

## 2023-08-21 NOTE — Patient Instructions (Addendum)
Thank you for coming to the office today.  We will request Pneumonia vaccine record from [facility]  For DEXA Scan (Bone mineral density) screening for osteoporosis  Call the Imaging Center below anytime to schedule your own appointment now that order has been placed.  [place] [address] [place], [place] [address] Phone: [phone]  Vaginal Swab test today  Start the Diflucan Yeast medication 1 pill then skip 1 day then 2nd pill, if it resolves, it was the correct answer  Once results on swab come back we can confirm.  Yeast / Candida - is what we are treating  BV or Bacterial vaginosis would require a different medication - Flagyl (metronidazole)  DUE for FASTING BLOOD WORK (no food or drink after midnight before the lab appointment, only water or coffee without cream/sugar on the morning of)  SCHEDULE "Lab Only" visit in the morning at the clinic for lab draw in 1 YEAR  - Make sure Lab Only appointment is at about 1 week before your next appointment, so that results will be available  For Lab Results, once available within 2-3 days of blood draw, you can can log in to MyChart online to view your results and a brief explanation. Also, we can discuss results at next follow-up visit.   Please schedule a Follow-up Appointment to: Return in about 1 year (around 08/20/2024) for 1 year fasting lab > 1 week later Annual Physical (prefer early afternoon apt).  If you have any other questions or concerns, please feel free to call the office or send a message through MyChart. You may also schedule an earlier appointment if necessary.  Additionally, you may be receiving a survey about your experience at our office within a few days to 1 week by e-mail or mail. We value your feedback.  Saralyn Pilar, DO [facility], [place]

## 2023-08-21 NOTE — Assessment & Plan Note (Signed)
No breakthrough seizures Followed by [facility] from [facility] CVA in past On Lamictal

## 2023-08-21 NOTE — Assessment & Plan Note (Signed)
Very well controlled lipids, LDL 73 History of CVA  Plan: 1. Continue Rosuvastatin 10mg  nightly 2. Continue ASA 81mg  for secondary prevention 3. Encourage improved lifestyle - low carb/cholesterol, reduce portion size, continue improving regular exercise

## 2023-08-21 NOTE — Assessment & Plan Note (Signed)
Chronic stable On SNRI Venlafaxine, Buspar

## 2023-08-21 NOTE — Assessment & Plan Note (Signed)
Controlled, stable major depression in partial remission Continue SNRI Venlafaxine 150mg  daily

## 2023-08-21 NOTE — Assessment & Plan Note (Signed)
History of CVA Stable  with some memory loss and history of seizure

## 2023-08-21 NOTE — Progress Notes (Signed)
Subjective:    Patient ID: [name], female    DOB: [birth year], 67 y.o.   MRN: [id number]  [name] is a 67 y.o. female presenting on 08/21/2023 for Annual Exam and Vaginal Itching   HPI  Here for Annual Physical and Lab Review.  Discussed the use of AI scribe software for clinical note transcription with the patient, who gave verbal consent to proceed.    HYPERLIPIDEMIA: - Reports no concerns. Last lipid panel 08/2023, controlled LDL 73 - Currently taking Rosuvastatin 10mg , tolerating well without side effects or myalgias   Lab review today. All results within normal range, except mild total protein and eos  No significant concerns. Similar to last year, nearly normal but mild abnormalities on the labs. She is doing well with lifestyle diet Weight is down 4-5 lbs in past 7+ months Hemoglobin A1c 5.3, slight increase but still in normal range.   Anxiety Major Depression partial remission, recurrent Chronic problem with depression and anxiety. Currently doing well. She has Buspar 5mg  THREE TIMES A DAY AS NEEDED, taking it often for twice a day dosing but not usually THREE TIMES A DAY. Also continues on Venlafaxine XR 150mg  daily,   History of CVA with residual deficit - memory loss and history of seizure No further complications or issues. On Aspirin 81, Lamictal for seizures.   Update  Right Knee, TKR Surgery - Followed by [facility] Dr Odis Luster, surgery for R- TKR was completed this Summer 2024. She has continued with Physical Therapy, continues to improve. Not at goal yet  Vaginal Itching She has had chronic problem for several months. She tried cotton underwear, tried creams, powders. No relief Admits some discharge and odor and mostly itching. Interested in swab test today. She has been treated for yeast vaginitis before   Health Maintenance:  No prior DEXA imaging. Interested in this now.   UTD Flu Shot, PNA , Shingrix vaccine -  We are requesting copy of previous Pneumonia [facility]23   Colonoscopy 03/2021, next due 7 years, or 2029  Mammogram abnormal 06/2023, she has done diagnostic and biopsy with fibroadenoma w calcifications. Negative for malignancy.     08/21/2023    1:18 PM 12/15/2022   10:48 AM 06/30/2022    2:37 PM  Depression screen PHQ 2/9  Decreased Interest 1 1 1   Down, Depressed, Hopeless 1 1 1   PHQ - 2 Score 2 2 2   Altered sleeping 2 1 2   Tired, decreased energy 2  2  Change in appetite 1 1 0  Feeling bad or failure about yourself  1 0 1  Trouble concentrating 0 0 1  Moving slowly or fidgety/restless 0 0 0  Suicidal thoughts 0 0 0  PHQ-9 Score 8 4 8   Difficult doing work/chores  Not difficult at all Not difficult at all    Past Medical History:  Diagnosis Date   Allergy    Anxiety    Arthritis    left hip   Blood transfusion without reported diagnosis    with hip surgery    GERD (gastroesophageal reflux disease)    Hepatitis 1972   hep B   Hyperlipidemia    controlled on meds    Memory disorder 09/21/2017   OSA (obstructive sleep apnea) 05/08/2018   PONV (postoperative nausea and vomiting)    Seizures (HCC)    last seizure 11-2016   Sleep apnea    no cpap    Stroke (HCC)  per pt    Past Surgical History:  Procedure Laterality Date   ABDOMINAL HYSTERECTOMY  04/1999   ARTHROSCOPIC REPAIR ACL     bilateral knees   BREAST BIOPSY Right 07/05/2023   stereo bx, calcs, COIL clip-path pending   BREAST BIOPSY Left 07/05/2023   stereo bx, calcs, RIBBON clip-path pending   BREAST BIOPSY Left 07/05/2023   stereo bx, calcs, X clip-path pending   BREAST BIOPSY Right 07/05/2023   MM RT BREAST BX W LOC DEV 1ST LESION IMAGE BX SPEC STEREO GUIDE 07/05/2023 [facility]   BREAST BIOPSY Left 07/05/2023   MM LT BREAST BX W LOC DEV EA AD LESION IMG BX SPEC STEREO GUIDE 07/05/2023 [facility]   BREAST BIOPSY Left 07/05/2023   MM LT BREAST BX W LOC DEV EA AD LESION  IMG BX SPEC STEREO GUIDE 07/05/2023 [facility]   BREAST EXCISIONAL BIOPSY Right 2008   NEG   BREAST EXCISIONAL BIOPSY Left YRS AGO   X2 - NEG   BREAST LUMPECTOMY  06/1979; 03/1995   left   CHOLECYSTECTOMY  09/1987   COLONOSCOPY     LAPAROSCOPIC GASTRIC SLEEVE RESECTION  09/30/2012   Procedure: LAPAROSCOPIC GASTRIC SLEEVE RESECTION;  Surgeon: Lodema Pilot, DO;  Location: [facility];  Service: General;  Laterality: N/A;   POLYPECTOMY     TOTAL HIP ARTHROPLASTY  11/13/2012   left   UPPER GASTROINTESTINAL ENDOSCOPY     UPPER GI ENDOSCOPY  09/30/2012   Procedure: UPPER GI ENDOSCOPY;  Surgeon: Lodema Pilot, DO;  Location: [facility];  Service: General;;   XI ROBOTIC LAPAROSCOPIC ASSISTED APPENDECTOMY N/A 04/24/2022   Procedure: XI ROBOTIC LAPAROSCOPIC ASSISTED APPENDECTOMY;  Surgeon: Carolan Shiver, MD;  Location: [facility];  Service: General;  Laterality: N/A;   Social History   Socioeconomic History   Marital status: Married    Spouse name: Not on file   Number of children: 0   Years of education: BS   Highest education level: Not on file  Occupational History   Not on file  Tobacco Use   Smoking status: Former    Current packs/day: 0.00    Average packs/day: 0.3 packs/day for 5.0 years (1.3 ttl pk-yrs)    Types: Cigarettes    Start date: 07/25/1996    Quit date: 07/25/2001    Years since quitting: 22.0   Smokeless tobacco: Never  Vaping Use   Vaping status: Never Used  Substance and Sexual Activity   Alcohol use: Yes    Alcohol/week: 0.0 standard drinks of alcohol    Comment: 2 glasses of wine per week   Drug use: No   Sexual activity: Not on file  Other Topics Concern   Not on file  Social History Narrative   Lives with spouse   Caffeine use: Drinks coffee daily   Right handed   Social Determinants of Health   Financial Resource Strain: Low Risk  (06/23/2022)   Overall Financial Resource Strain (CARDIA)    Difficulty of Paying Living Expenses: Not very hard   Food Insecurity: No Food Insecurity (06/23/2022)   Hunger Vital Sign    Worried About Running Out of Food in the Last Year: Never true    Ran Out of Food in the Last Year: Never true  Transportation Needs: No Transportation Needs (06/23/2022)   PRAPARE - Administrator, Civil Service (Medical): No    Lack of Transportation (Non-Medical): No  Physical Activity: Sufficiently Active (06/23/2022)   Exercise Vital Sign    Days of  Exercise per Week: 4 days    Minutes of Exercise per Session: 40 min  Stress: No Stress Concern Present (06/23/2022)   [employer] of Occupational Health - Occupational Stress Questionnaire    Feeling of Stress : Only a little  Social Connections: Moderately Integrated (06/23/2022)   Social Connection and Isolation Panel [NHANES]    Frequency of Communication with Friends and Family: More than three times a week    Frequency of Social Gatherings with Friends and Family: Once a week    Attends Religious Services: More than 4 times per year    Active Member of [employer] or Organizations: No    Attends Banker Meetings: Never    Marital Status: Married  Catering manager Violence: Not At Risk (06/23/2022)   Humiliation, Afraid, Rape, and Kick questionnaire    Fear of Current or Ex-Partner: No    Emotionally Abused: No    Physically Abused: No    Sexually Abused: No   Family History  Problem Relation Age of Onset   Hyperlipidemia Mother    Obesity Mother    Pulmonary fibrosis Mother    Cancer Maternal Aunt        ovarian cancer   Cancer Father    Prostate cancer Father    Colon cancer Paternal Grandmother    Esophageal cancer Neg Hx    Stomach cancer Neg Hx    Rectal cancer Neg Hx    Breast cancer Neg Hx    Colon polyps Neg Hx    Current Outpatient Medications on File Prior to Visit  Medication Sig   aspirin EC 81 MG tablet Take 81 mg by mouth daily. Swallow whole.   B Complex Vitamins (VITAMIN-B COMPLEX PO) Take by mouth daily.    ibuprofen (ADVIL) 600 MG tablet Take 1 tablet (600 mg total) by mouth every 6 (six) hours as needed.   lamoTRIgine (LAMICTAL) 100 MG tablet Take 1 tablet (100 mg total) by mouth 2 (two) times daily.   Melatonin 3 MG TABS Take 6 mg by mouth at bedtime.    Multiple Vitamin (MULTIVITAMIN WITH MINERALS) TABS Take 1 tablet by mouth daily.    Omega-3 Fatty Acids (FISH OIL) 1200 MG CAPS Take 1 capsule by mouth daily.   No current facility-administered medications on file prior to visit.    Review of Systems  Constitutional:  Negative for activity change, appetite change, chills, diaphoresis, fatigue and fever.  HENT:  Negative for congestion and hearing loss.   Eyes:  Negative for visual disturbance.  Respiratory:  Negative for cough, chest tightness, shortness of breath and wheezing.   Cardiovascular:  Negative for chest pain, palpitations and leg swelling.  Gastrointestinal:  Negative for abdominal pain, constipation, diarrhea, nausea and vomiting.  Genitourinary:  Negative for dysuria, frequency and hematuria.       Vaginal itching  Musculoskeletal:  Negative for arthralgias and neck pain.  Skin:  Negative for rash.  Neurological:  Negative for dizziness, weakness, light-headedness, numbness and headaches.  Hematological:  Negative for adenopathy.  Psychiatric/Behavioral:  Negative for behavioral problems, dysphoric mood and sleep disturbance.    Per HPI unless specifically indicated above      Objective:    BP 110/84 (BP Location: Right Arm, Patient Position: Sitting, Cuff Size: Large)   Ht 5\' 2"  (1.575 m)   Wt 218 lb (98.9 kg)   BMI 39.87 kg/m   Wt Readings from Last 3 Encounters:  08/21/23 218 lb (98.9 kg)  12/15/22 222 lb (100.7  kg)  12/14/22 185 lb (83.9 kg)    Physical Exam Vitals and nursing note reviewed.  Constitutional:      General: She is not in acute distress.    Appearance: She is well-developed. She is not diaphoretic.     Comments: Well-appearing,  comfortable, cooperative  HENT:     Head: Normocephalic and atraumatic.  Eyes:     General:        Right eye: No discharge.        Left eye: No discharge.     Conjunctiva/sclera: Conjunctivae normal.     Pupils: Pupils are equal, round, and reactive to light.  Neck:     Thyroid: No thyromegaly.  Cardiovascular:     Rate and Rhythm: Normal rate and regular rhythm.     Pulses: Normal pulses.     Heart sounds: Normal heart sounds. No murmur heard. Pulmonary:     Effort: Pulmonary effort is normal. No respiratory distress.     Breath sounds: Normal breath sounds. No wheezing or rales.  Abdominal:     General: Bowel sounds are normal. There is no distension.     Palpations: Abdomen is soft. There is no mass.     Tenderness: There is no abdominal tenderness.  Musculoskeletal:        General: No tenderness. Normal range of motion.     Cervical back: Normal range of motion and neck supple.     Right lower leg: No edema.     Left lower leg: No edema.     Comments: Upper / Lower Extremities: - Normal muscle tone, strength bilateral upper extremities 5/5, lower extremities 5/5  Lymphadenopathy:     Cervical: No cervical adenopathy.  Skin:    General: Skin is warm and dry.     Findings: No erythema or rash.  Neurological:     Mental Status: She is alert and oriented to person, place, and time.     Comments: Distal sensation intact to light touch all extremities  Psychiatric:        Mood and Affect: Mood normal.        Behavior: Behavior normal.        Thought Content: Thought content normal.     Comments: Well groomed, good eye contact, normal speech and thoughts       Results for orders placed or performed in visit on 08/15/23  Vitamin B12  Result Value Ref Range   Vitamin B-12 485 200 - 1,100 pg/mL  VITAMIN D 25 Hydroxy (Vit-D Deficiency, Fractures)  Result Value Ref Range   Vit D, 25-Hydroxy 53 30 - 100 ng/mL  TSH  Result Value Ref Range   TSH 2.71 0.40 - 4.50 mIU/L   CBC with Differential/Platelet  Result Value Ref Range   WBC 7.7 3.8 - 10.8 Thousand/uL   RBC 3.85 3.80 - 5.10 Million/uL   Hemoglobin 12.2 11.7 - 15.5 g/dL   HCT 40.9 81.1 - 91.4 %   MCV 98.2 80.0 - 100.0 fL   MCH 31.7 27.0 - 33.0 pg   MCHC 32.3 32.0 - 36.0 g/dL   RDW 78.2 95.6 - 21.3 %   Platelets 338 140 - 400 Thousand/uL   MPV 9.0 7.5 - 12.5 fL   Neutro Abs 3,927 1,500 - 7,800 cells/uL   Absolute Lymphocytes 2,587 850 - 3,900 cells/uL   Absolute Monocytes 508 200 - 950 cells/uL   Eosinophils Absolute 601 (H) 15 - 500 cells/uL   Basophils Absolute 77 0 - 200  cells/uL   Neutrophils Relative % 51 %   Total Lymphocyte 33.6 %   Monocytes Relative 6.6 %   Eosinophils Relative 7.8 %   Basophils Relative 1.0 %  COMPLETE METABOLIC PANEL WITH GFR  Result Value Ref Range   Glucose, Bld 101 65 - 139 mg/dL   BUN 13 7 - 25 mg/dL   Creat 1.61 0.96 - 0.45 mg/dL   eGFR 59 (L) > OR = 60 mL/min/1.27m2   BUN/Creatinine Ratio SEE NOTE: 6 - 22 (calc)   Sodium 143 135 - 146 mmol/L   Potassium 4.2 3.5 - 5.3 mmol/L   Chloride 107 98 - 110 mmol/L   CO2 28 20 - 32 mmol/L   Calcium 8.9 8.6 - 10.4 mg/dL   Total Protein 6.0 (L) 6.1 - 8.1 g/dL   Albumin 3.8 3.6 - 5.1 g/dL   Globulin 2.2 1.9 - 3.7 g/dL (calc)   AG Ratio 1.7 1.0 - 2.5 (calc)   Total Bilirubin 0.3 0.2 - 1.2 mg/dL   Alkaline phosphatase (APISO) 94 37 - 153 U/L   AST 18 10 - 35 U/L   ALT 16 6 - 29 U/L  Lipid panel  Result Value Ref Range   Cholesterol 161 <200 mg/dL   HDL 70 > OR = 50 mg/dL   Triglycerides 94 <409 mg/dL   LDL Cholesterol (Calc) 73 mg/dL (calc)   Total CHOL/HDL Ratio 2.3 <5.0 (calc)   Non-HDL Cholesterol (Calc) 91 <811 mg/dL (calc)  Hemoglobin B1Y  Result Value Ref Range   Hgb A1c MFr Bld 5.3 <5.7 % of total Hgb   Mean Plasma Glucose 105 mg/dL   eAG (mmol/L) 5.8 mmol/L      Assessment & Plan:   Problem List Items Addressed This Visit     Anxiety    Chronic stable On SNRI Venlafaxine, Buspar       Relevant Medications   busPIRone (BUSPAR) 5 MG tablet   venlafaxine XR (EFFEXOR-XR) 150 MG 24 hr capsule   History of hemorrhagic cerebrovascular accident (CVA) with residual deficit    History of CVA Stable  with some memory loss and history of seizure      Hyperlipidemia    Very well controlled lipids, LDL 73 History of CVA  Plan: 1. Continue Rosuvastatin 10mg  nightly 2. Continue ASA 81mg  for secondary prevention 3. Encourage improved lifestyle - low carb/cholesterol, reduce portion size, continue improving regular exercise       Relevant Medications   rosuvastatin (CRESTOR) 10 MG tablet   aspirin EC 81 MG tablet   Obesity (BMI 35.0-39.9 without comorbidity)   Recurrent major depression in partial remission (HCC)    Controlled, stable major depression in partial remission Continue SNRI Venlafaxine 150mg  daily      Relevant Medications   busPIRone (BUSPAR) 5 MG tablet   venlafaxine XR (EFFEXOR-XR) 150 MG 24 hr capsule   Seizures (HCC)    No breakthrough seizures Followed by [facility] CVA in past On Lamictal      Other Visit Diagnoses     Annual physical exam    -  Primary   Postmenopausal estrogen deficiency       Relevant Orders   DG Bone Density   Yeast vaginitis       Relevant Medications   fluconazole (DIFLUCAN) 150 MG tablet   Other Relevant Orders   Cervicovaginal ancillary only       Updated Health Maintenance information Reviewed recent lab results with patient Encouraged improvement to  lifestyle with diet and exercise Goal of weight loss  Vaginal Itching Progressive itching for several months, tried multiple over-the-counter remedies without relief. Possible yeast infection or bacterial vaginosis. No history of urinary tract infection. -Perform vaginal swab for testing. -Start Diflucan (Fluconazole) for suspected yeast infection:one pill, skip a day, then second pill. -If swab results indicate bacterial vaginosis, will need  to switch to Flagyl (Metronidazole).  Right Knee Replacement [facility] Dr Odis Luster Surgery completed in summer 2024 -Continue follow-up with orthopedics and physical therapy, making progress now  General Health Maintenance: -Request records of previous pneumonia vaccines. -Next colon screening due in 2029. -Order DEXA bone density scan, patient to schedule at her convenience.       Orders Placed This Encounter  Procedures   DG Bone Density    Standing Status:   Future    Standing Expiration Date:   08/20/2024    Order Specific Question:   Reason for Exam (SYMPTOM  OR DIAGNOSIS REQUIRED)    Answer:   post menopausal estrogen deficiency    Order Specific Question:   Preferred imaging location?    Answer:   [facility]     Meds ordered this encounter  Medications   rosuvastatin (CRESTOR) 10 MG tablet    Sig: Take 1 tablet (10 mg total) by mouth at bedtime.    Dispense:  90 tablet    Refill:  3    Add future refills. Patient is not ready to fill yet.   busPIRone (BUSPAR) 5 MG tablet    Sig: Take 1 tablet (5 mg total) by mouth 3 (three) times daily as needed (anxiety).    Dispense:  270 tablet    Refill:  3    Add future refills. Patient is not ready to fill yet.   venlafaxine XR (EFFEXOR-XR) 150 MG 24 hr capsule    Sig: Take 1 capsule (150 mg total) by mouth daily at 6 (six) AM.    Dispense:  90 capsule    Refill:  3    Add future refills. Patient is not ready to fill yet.   fluconazole (DIFLUCAN) 150 MG tablet    Sig: Take one tablet by mouth on Day 1. Repeat dose 2nd tablet on Day 3.    Dispense:  2 tablet    Refill:  1      Follow up plan: Return in about 1 year (around 08/20/2024) for 1 year fasting lab > 1 week later Annual Physical (prefer early afternoon apt).  Saralyn Pilar, DO [facility] 08/21/2023, 1:22 PM

## 2023-08-23 LAB — CERVICOVAGINAL ANCILLARY ONLY
Bacterial Vaginitis (gardnerella): NEGATIVE
Candida Glabrata: NEGATIVE
Candida Vaginitis: NEGATIVE
Comment: NEGATIVE
Comment: NEGATIVE
Comment: NEGATIVE

## 2023-09-26 ENCOUNTER — Other Ambulatory Visit: Payer: Self-pay | Admitting: Family Medicine

## 2023-09-26 DIAGNOSIS — E7800 Pure hypercholesterolemia, unspecified: Secondary | ICD-10-CM

## 2023-09-26 DIAGNOSIS — F3341 Major depressive disorder, recurrent, in partial remission: Secondary | ICD-10-CM

## 2023-09-26 NOTE — Telephone Encounter (Signed)
Rx for both 08/21/23 #90 3RF Requested Prescriptions  Pending Prescriptions Disp Refills   venlafaxine XR (EFFEXOR-XR) 150 MG 24 hr capsule [Pharmacy Med Name: Venlafaxine HCl ER Oral Capsule Extended Release 24 Hour 150 MG] 90 capsule 3    Sig: TAKE 1 CAPSULE DAILY AT 6 (SIX) AM.     Psychiatry: Antidepressants - SNRI - desvenlafaxine & venlafaxine Failed - 09/26/2023 11:52 AM      Failed - Lipid Panel in normal range within the last 12 months    Cholesterol  Date Value Ref Range Status  08/16/2023 161 <200 mg/dL Final   LDL Cholesterol (Calc)  Date Value Ref Range Status  08/16/2023 73 mg/dL (calc) Final    Comment:    Reference range: <100 . Desirable range <100 mg/dL for primary prevention;   <70 mg/dL for patients with CHD or diabetic patients  with > or = 2 CHD risk factors. Marland Kitchen LDL-C is now calculated using the Martin-Hopkins  calculation, which is a validated novel method providing  better accuracy than the Friedewald equation in the  estimation of LDL-C.  Horald Pollen et al. Lenox Ahr. 6063;016(01): 2061-2068  (http://education.[facility].com/faq/FAQ164)    Direct LDL  Date Value Ref Range Status  12/19/2012 146.6 mg/dL Final    Comment:    Optimal:  <100 mg/dLNear or Above Optimal:  100-129 mg/dLBorderline High:  130-159 mg/dLHigh:  160-189 mg/dLVery High:  >190 mg/dL   HDL  Date Value Ref Range Status  08/16/2023 70 > OR = 50 mg/dL Final   Triglycerides  Date Value Ref Range Status  08/16/2023 94 <150 mg/dL Final         Passed - Cr in normal range and within 360 days    Creat  Date Value Ref Range Status  08/16/2023 1.05 0.50 - 1.05 mg/dL Final   Creatinine,U  Date Value Ref Range Status  03/31/2014 113.8 mg/dL Final         Passed - Completed PHQ-2 or PHQ-9 in the last 360 days      Passed - Last BP in normal range    BP Readings from Last 1 Encounters:  08/21/23 110/84         Passed - Valid encounter within last 6 months    Recent Outpatient  Visits           1 month ago Annual physical exam   [place] [facility] Smitty Cords, DO   9 months ago Dental abscess   [place] [facility] Smitty Cords, DO   1 year ago Annual physical exam   [place] [facility] Smitty Cords, DO   1 year ago Pure hypercholesterolemia   [place] [facility] Smitty Cords, DO   2 years ago Annual physical exam   [place] [facility] Smitty Cords, DO       Future Appointments             In 2 months Christia Reading, Lindalou Hose, NP [facility]   In 11 months Althea Charon, Netta Neat, DO [facility]             rosuvastatin (CRESTOR) 10 MG tablet [Pharmacy Med Name: Rosuvastatin Calcium Oral Tablet 10 MG] 90 tablet 3    Sig: TAKE 1 TABLET AT BEDTIME (NEED MD APPOINTMENT FOR REFILLS)     Cardiovascular:  Antilipid - Statins 2 Failed - 09/26/2023  11:52 AM      Failed - Lipid Panel in normal range within the last 12 months    Cholesterol  Date Value Ref Range Status  08/16/2023 161 <200 mg/dL Final   LDL Cholesterol (Calc)  Date Value Ref Range Status  08/16/2023 73 mg/dL (calc) Final    Comment:    Reference range: <100 . Desirable range <100 mg/dL for primary prevention;   <70 mg/dL for patients with CHD or diabetic patients  with > or = 2 CHD risk factors. Marland Kitchen LDL-C is now calculated using the Martin-Hopkins  calculation, which is a validated novel method providing  better accuracy than the Friedewald equation in the  estimation of LDL-C.  Horald Pollen et al. Lenox Ahr. 6578;469(62): 2061-2068  (http://education.[facility].com/faq/FAQ164)    Direct LDL  Date Value Ref Range Status  12/19/2012 146.6 mg/dL Final    Comment:    Optimal:  <100 mg/dLNear or Above Optimal:  100-129 mg/dLBorderline High:  130-159 mg/dLHigh:  160-189  mg/dLVery High:  >190 mg/dL   HDL  Date Value Ref Range Status  08/16/2023 70 > OR = 50 mg/dL Final   Triglycerides  Date Value Ref Range Status  08/16/2023 94 <150 mg/dL Final         Passed - Cr in normal range and within 360 days    Creat  Date Value Ref Range Status  08/16/2023 1.05 0.50 - 1.05 mg/dL Final   Creatinine,U  Date Value Ref Range Status  03/31/2014 113.8 mg/dL Final         Passed - Patient is not pregnant      Passed - Valid encounter within last 12 months    Recent Outpatient Visits           1 month ago Annual physical exam   [place] [facility] Smitty Cords, DO   9 months ago Dental abscess   [place] [facility] Smitty Cords, DO   1 year ago Annual physical exam   [place] [facility] Smitty Cords, DO   1 year ago Pure hypercholesterolemia   [place] [facility] Smitty Cords, DO   2 years ago Annual physical exam   [place] [facility] Smitty Cords, DO       Future Appointments             In 2 months Christia Reading, Lindalou Hose, NP [facility]   In 11 months Althea Charon, Netta Neat, DO [place] [facility]

## 2023-10-23 ENCOUNTER — Ambulatory Visit (INDEPENDENT_AMBULATORY_CARE_PROVIDER_SITE_OTHER): Payer: Medicare HMO | Admitting: Family Medicine

## 2023-10-23 ENCOUNTER — Encounter: Payer: Self-pay | Admitting: Family Medicine

## 2023-10-23 VITALS — BP 110/72 | HR 94 | Ht 62.0 in | Wt 218.0 lb

## 2023-10-23 DIAGNOSIS — F3341 Major depressive disorder, recurrent, in partial remission: Secondary | ICD-10-CM

## 2023-10-23 DIAGNOSIS — R053 Chronic cough: Secondary | ICD-10-CM | POA: Diagnosis not present

## 2023-10-23 DIAGNOSIS — F17200 Nicotine dependence, unspecified, uncomplicated: Secondary | ICD-10-CM

## 2023-10-23 DIAGNOSIS — F419 Anxiety disorder, unspecified: Secondary | ICD-10-CM | POA: Diagnosis not present

## 2023-10-23 DIAGNOSIS — N952 Postmenopausal atrophic vaginitis: Secondary | ICD-10-CM | POA: Diagnosis not present

## 2023-10-23 DIAGNOSIS — Z636 Dependent relative needing care at home: Secondary | ICD-10-CM | POA: Diagnosis not present

## 2023-10-23 MED ORDER — ESTRADIOL 0.1 MG/GM VA CREA: TOPICAL_CREAM | VAGINAL | 12 refills | Status: DC

## 2023-10-23 MED ORDER — ALPRAZOLAM 0.5 MG PO TABS
0.5000 mg | ORAL_TABLET | Freq: Two times a day (BID) | ORAL | 1 refills | Status: DC | PRN
Start: 2023-10-23 — End: 2023-11-30

## 2023-10-23 NOTE — Patient Instructions (Addendum)
 Thank you for coming to the office today.  Likely Vaginal Atrophic symptoms causing itching  We will order estrogen cream to the pharmacy. -----------------------------  Trial on Xanax  alprazolam  0.5mg  low to medium dose take it as needed for anxiety   Goal to limit smoking once situation has improved   Check into therapist / cognitive therapy - consider [facility] for virtual  These offices have both PSYCHIATRY doctors and THERAPISTS  [facility] Scientist, [place] [facility] Available) [place] [address] [place], [place] [address] Phone: [phone]  [facility] Address: [address], [place], [place] [address] [email or web address] Phone: [phone]   [facility]) Address: [address], [place], [place] [address] Hours: 8:30AM-5PM Phone: [phone]  [facility] (Adult, Peds, Geriatric, Counseling) [address] [place], [place] [address] Phone: [phone] Fax: [phone]  [facility] [address] [place], [place] [address] Phone: [phone]  [facility] (All ages) [address] [place], [id number] Phone: [phone] (Option 1) [email or web address]  ----------------------------------------------------------------- THERAPIST ONLY  (No Psychiatry)  [facility] [address] [place], [place] [address] [place]  P: [phone]  [facility]) [facility], [facility] [address] [place], [place] [address] [phone]  [facility]   Address: [address] [place], [place] [address] Hours: Open today  9AM-7PM Phone: [phone]  [facility] [address] [place] [facility]  - [facility] Address: [address] [place], [place] [address] Phone: [phone]  [facility] [place], [place] [address] Phone: [phone]   Please schedule a Follow-up Appointment to: Return if symptoms worsen or fail to improve.  If you have any other questions or concerns, please feel free to call the office or send a message through MyChart. You may also schedule an earlier appointment if necessary.  Additionally, you may be receiving a survey about your experience at our office within a few days to 1 week by e-mail or mail. We value your feedback.  Marsa Officer, DO [facility], [place]

## 2023-10-23 NOTE — Progress Notes (Signed)
 Subjective:    Patient ID: [name], female    DOB: 06-15-1956, 68 y.o.   MRN: [id number]  [name] is a 68 y.o. female presenting on 10/23/2023 for Anxiety   HPI  Discussed the use of AI scribe software for clinical note transcription with the patient, who gave verbal consent to proceed.  History of Present Illness     Atrophic Vaginitis The primary issue was persistent vaginal distress, characterized by intense itching, despite a negative swab test for BV bacteria and yeast. The patient reported that the symptoms did not respond to empiric fluconazole  after trial to treat possible yeast infection. Concern with vaginal atrophy dryness.  Anxiety The patient also reported experiencing extreme anxiety, which she attributed to recent life stressors, including moving houses and caring for a father with dementia. The anxiety was so severe that it often led to spontaneous crying episodes. The patient expressed feeling overwhelmed by the current situation and was seeking help to manage these feelings. - She is on Venlafaxine , Buspar , Lamotrigine  - Not followed by Psychiatry or therapist - Interested in medication adjustment / referral in future. She has been on Xanax  in past AS NEEDED interested in this again short term.  Smoking / Cough The patient also mentioned a persistent cough, which she believed was due to her smoking habit. The patient expressed a desire to quit smoking but acknowledged the difficulty of doing so, especially given the current stressors.   Upcoming apt with Orthopedics Dr Leora follow-up knee pain after surgery       10/23/2023    3:38 PM 08/21/2023    1:18 PM 12/15/2022   10:48 AM  Depression screen PHQ 2/9  Decreased Interest 2 1 1   Down, Depressed, Hopeless 2 1 1   PHQ - 2 Score 4 2 2   Altered sleeping 1 2 1   Tired, decreased energy 2 2   Change in appetite 2 1 1   Feeling bad or failure about yourself  2 1 0  Trouble  concentrating 2 0 0  Moving slowly or fidgety/restless 2 0 0  Suicidal thoughts 2 0 0  PHQ-9 Score 17 8 4   Difficult doing work/chores Somewhat difficult  Not difficult at all       10/23/2023    3:38 PM 08/21/2023    1:19 PM 12/15/2022   10:48 AM 06/30/2022    2:38 PM  GAD 7 : Generalized Anxiety Score  Nervous, Anxious, on Edge 2 2 1 2   Control/stop worrying 3 1 1 2   Worry too much - different things 3 2 1 2   Trouble relaxing 3 1 0 1  Restless 2 0 0 0  Easily annoyed or irritable 2 1 0 0  Afraid - awful might happen 3 1 0 2  Total GAD 7 Score 18 8 3 9   Anxiety Difficulty Very difficult  Not difficult at all Not difficult at all    Social History   Tobacco Use   Smoking status: Former    Current packs/day: 0.00    Average packs/day: 0.3 packs/day for 5.0 years (1.3 ttl pk-yrs)    Types: Cigarettes    Start date: 07/25/1996    Quit date: 07/25/2001    Years since quitting: 22.2   Smokeless tobacco: Never  Vaping Use   Vaping status: Never Used  Substance Use Topics   Alcohol use: Yes    Alcohol/week: 0.0 standard drinks of alcohol    Comment: 2 glasses of wine per week  Drug use: No    Review of Systems Per HPI unless specifically indicated above     Objective:    BP 110/72   Pulse 94   Ht 5' 2 (1.575 m)   Wt 218 lb (98.9 kg)   SpO2 97%   BMI 39.87 kg/m   Wt Readings from Last 3 Encounters:  10/23/23 218 lb (98.9 kg)  08/21/23 218 lb (98.9 kg)  12/15/22 222 lb (100.7 kg)    Physical Exam Vitals and nursing note reviewed.  Constitutional:      General: She is not in acute distress.    Appearance: Normal appearance. She is well-developed. She is not diaphoretic.     Comments: Well-appearing, comfortable, cooperative  HENT:     Head: Normocephalic and atraumatic.  Eyes:     General:        Right eye: No discharge.        Left eye: No discharge.     Conjunctiva/sclera: Conjunctivae normal.  Cardiovascular:     Rate and Rhythm: Normal rate.   Pulmonary:     Effort: Pulmonary effort is normal.  Skin:    General: Skin is warm and dry.     Findings: No erythema or rash.  Neurological:     Mental Status: She is alert and oriented to person, place, and time.  Psychiatric:        Mood and Affect: Mood normal.        Behavior: Behavior normal.        Thought Content: Thought content normal.     Comments: Well groomed, good eye contact, normal speech and thoughts. Some anxious moments and slightly tearful with overwhelmed thoughts     Results for orders placed or performed in visit on 08/21/23  Cervicovaginal ancillary only   Collection Time: 08/21/23  1:49 PM  Result Value Ref Range   Bacterial Vaginitis (gardnerella) Negative    Candida Vaginitis Negative    Candida Glabrata Negative    Comment Normal Reference Range Candida Species - Negative    Comment Normal Reference Range Candida Galbrata - Negative    Comment      Normal Reference Range Bacterial Vaginosis - Negative      Assessment & Plan:   Problem List Items Addressed This Visit     Anxiety   Relevant Medications   ALPRAZolam  (XANAX ) 0.5 MG tablet   Other Relevant Orders   AMB Referral VBCI Care Management   Atrophic vaginitis - Primary   Relevant Medications   estradiol  (ESTRACE ) 0.1 MG/GM vaginal cream   Recurrent major depression in partial remission (HCC)   Relevant Medications   ALPRAZolam  (XANAX ) 0.5 MG tablet   Other Visit Diagnoses       Caregiver burden       Relevant Orders   AMB Referral VBCI Care Management     Chronic cough         Smoking            Atrophic Vaginitis Recent history 08/2023 trial on Fluconazole  for possible yeast vaginitis, swab test negative for BV and Yeast. Ultimately based on history, suggestive of Atrophic Vaginitis -Start topical estrogen cream (estrace  generic) Apply 1 gram per vagina every night for 2 weeks, then apply three times a week for maintenance -Consider referral to Gynecology if symptoms  persist.  Anxiety Chronic problem. Now experiencing increased anxiety due to multiple stressors including caregiving for a family member with dementia and an upcoming move. - Continue current therapy with  SNRI Venlafaxine  150mg  XR, Lamotrigine , Buspar  AS NEEDED Add short-term use of Alprazolam  0.5mg  twice daily as needed for acute anxiety. Encouraged her to take lower half dose intermittent, not regular dosing. Use as AS NEEDED. She has taken BDZ xanax  before. Reviewed risks and benefits and scope of treatment. -Explore cognitive therapy options, consider [facility] for virtual therapy. She will consider and self refer or request us  to place order -Referral to [facility] care management team for additional support and resources / caregiver burden  Chronic Cough Patient reports increased coughing and admits to smoking approximately 4 cigarettes per day. -Advise cessation of smoking, particularly in light of cough symptoms. -Consider timing of quit attempt to coincide with a less stressful period. -Encourage replacement of smoking habit with a different routine or activity.  Orthopedic concern - she has aching and pain following knee replacement, she has apt with Ortho Dr Leora     Orders Placed This Encounter  Procedures   AMB Referral VBCI Care Management    Referral Priority:   Routine    Referral Type:   Consultation    Referral Reason:   Care Coordination    Number of Visits Requested:   1    Meds ordered this encounter  Medications   ALPRAZolam  (XANAX ) 0.5 MG tablet    Sig: Take 1 tablet (0.5 mg total) by mouth 2 (two) times daily as needed for anxiety.    Dispense:  30 tablet    Refill:  1   estradiol  (ESTRACE ) 0.1 MG/GM vaginal cream    Sig: Apply 1 gram per vagina every night for 2 weeks, then apply three times a week    Dispense:  42.5 g    Refill:  12    Follow up plan: Return if symptoms worsen or fail to improve.    Marsa Officer, DO [facility] 10/23/2023, 4:09 PM

## 2023-10-24 ENCOUNTER — Telehealth: Payer: Self-pay | Admitting: *Deleted

## 2023-10-24 NOTE — Progress Notes (Signed)
Complex Care Management Note Care Guide Note  10/24/2023 Name: [name] MRN: [id number] DOB: 09-24-1956   Complex Care Management Outreach Attempts: An unsuccessful telephone outreach was attempted today to offer the patient information about available complex care management services.  Follow Up Plan:  Additional outreach attempts will be made to offer the patient complex care management information and services.   Encounter Outcome:  No Answer  Burman Nieves, CMA, Care Guide [facility]  [facility], [facility] Guide Direct Dial: [phone]  Fax: [phone] Website: [email or web address]

## 2023-10-25 NOTE — Progress Notes (Signed)
Complex Care Management Note Care Guide Note  10/25/2023 Name: [name] MRN: [id number] DOB: 1955-10-17   Complex Care Management Outreach Attempts: A second unsuccessful outreach was attempted today to offer the patient with information about available complex care management services.  Follow Up Plan:  Additional outreach attempts will be made to offer the patient complex care management information and services.   Encounter Outcome:  No Answer  Burman Nieves, CMA, Care Guide [facility]  [facility], [facility] Guide Direct Dial: [phone]  Fax: [phone] Website: [email or web address]

## 2023-10-26 NOTE — Progress Notes (Signed)
Complex Care Management Note Care Guide Note  10/26/2023 Name: [name] MRN: [id number] DOB: 1956/01/14   Complex Care Management Outreach Attempts: A third unsuccessful outreach was attempted today to offer the patient with information about available complex care management services.  Follow Up Plan:  No further outreach attempts will be made at this time. We have been unable to contact the patient to offer or enroll patient in complex care management services.  Encounter Outcome:  No Answer  Burman Nieves, CMA, Care Guide [facility]  [facility], [facility] Guide Direct Dial: [phone]  Fax: [phone] Website: [email or web address]

## 2023-10-29 DIAGNOSIS — Z96651 Presence of right artificial knee joint: Secondary | ICD-10-CM | POA: Diagnosis not present

## 2023-10-29 DIAGNOSIS — M1712 Unilateral primary osteoarthritis, left knee: Secondary | ICD-10-CM | POA: Diagnosis not present

## 2023-11-30 ENCOUNTER — Ambulatory Visit: Payer: Medicare HMO | Admitting: Family Medicine

## 2023-11-30 ENCOUNTER — Encounter: Payer: Self-pay | Admitting: Family Medicine

## 2023-11-30 VITALS — BP 110/78 | HR 115 | Ht 62.0 in | Wt 217.0 lb

## 2023-11-30 DIAGNOSIS — R112 Nausea with vomiting, unspecified: Secondary | ICD-10-CM

## 2023-11-30 DIAGNOSIS — R300 Dysuria: Secondary | ICD-10-CM | POA: Diagnosis not present

## 2023-11-30 DIAGNOSIS — K219 Gastro-esophageal reflux disease without esophagitis: Secondary | ICD-10-CM

## 2023-11-30 DIAGNOSIS — D242 Benign neoplasm of left breast: Secondary | ICD-10-CM | POA: Diagnosis not present

## 2023-11-30 DIAGNOSIS — N644 Mastodynia: Secondary | ICD-10-CM | POA: Diagnosis not present

## 2023-11-30 DIAGNOSIS — F419 Anxiety disorder, unspecified: Secondary | ICD-10-CM | POA: Diagnosis not present

## 2023-11-30 LAB — POCT URINALYSIS DIPSTICK
Blood, UA: NEGATIVE
Glucose, UA: NEGATIVE
Ketones, UA: NEGATIVE
Leukocytes, UA: NEGATIVE
Nitrite, UA: NEGATIVE
Odor: POSITIVE
Protein, UA: NEGATIVE
Spec Grav, UA: 1.025 (ref 1.010–1.025)
Urobilinogen, UA: 0.2 U/dL
pH, UA: 5 (ref 5.0–8.0)

## 2023-11-30 MED ORDER — ALPRAZOLAM 0.5 MG PO TABS
0.5000 mg | ORAL_TABLET | Freq: Two times a day (BID) | ORAL | 2 refills | Status: DC | PRN
Start: 2023-11-30 — End: 2024-04-09

## 2023-11-30 MED ORDER — SUCRALFATE 1 G PO TABS
1.0000 g | ORAL_TABLET | Freq: Three times a day (TID) | ORAL | 2 refills | Status: AC
Start: 2023-11-30 — End: ?

## 2023-11-30 MED ORDER — PANTOPRAZOLE SODIUM 40 MG PO TBEC: 40.0000 mg | DELAYED_RELEASE_TABLET | Freq: Every day | ORAL | 2 refills | Status: DC

## 2023-11-30 NOTE — Progress Notes (Signed)
Subjective:    Patient ID: [name], female    DOB: 1956/08/09, 68 y.o.   MRN: [id number]  Caryl Fate is a 68 y.o. female presenting on 11/30/2023 for Gastroesophageal Reflux and Anxiety   HPI  Discussed the use of AI scribe software for clinical note transcription with the patient, who gave verbal consent to proceed.  History of Present Illness   Emryn Flanery is a 68 year old female who presents with digestive issues, breast pain, and anxiety.  GERD / IBS She experiences ongoing digestive issues, which she attributes to stress. Symptoms include urgency, diarrhea, and occasional vomiting after eating. She has loose stools every other day and takes Tums two to three times daily for burning sensations. She previously used pantoprazole until 2018.  Left Breast Pain She experiences sporadic, sharp, needle-like pain around her left nipple. She had previous biopsy 06/2023 for abnormal mammogram diagnosed w/ fibroadenomatous changes with calcifications, but the current pain is in a different location. The pain is episodic rather than constant. - Interested in referral for evaluation  Anxiety She is experiencing increased anxiety, which she attributes to her father's worsening dementia and the stress of moving houses. Her pulse frequently exceeds 100 bpm. She uses Xanax 0.5mg , typically taking half a pill, to manage her anxiety 1-2 times per day and  finds it helps her sleep.  Nicotine Dependence / Smoking She is actively reducing her smoking habit, currently down to three cigarettes a day from five. Smoking is associated with specific routines, such as after meals. She reports increased coughing and breathlessness, which she suspects is related to her smoking.     Urinary symptoms Question Dysuria vs Urinary frequency. Follow up on this seems improved after topical estrogen therapy UA negative  Atrophic Vaginitis The primary issue was persistent vaginal  distress, characterized by intense itching, despite a negative swab test for BV bacteria and yeast. The patient reported that the symptoms did not respond to empiric fluconazole after trial to treat possible yeast infection. - Improved now on topical vaginal estrogen estrace  Anxiety The patient also reported experiencing extreme anxiety, which she attributed to recent life stressors, including moving houses and caring for a father with dementia. The anxiety was so severe that it often led to spontaneous crying episodes. The patient expressed feeling overwhelmed by the current situation   She is experiencing increased anxiety, which she attributes to her father's worsening dementia and the stress of moving houses. Her pulse frequently exceeds 100 bpm. She uses Xanax 0.5mg , typically taking half a pill, to manage her anxiety 1-2 times per day and  finds it helps her sleep. - She is on Venlafaxine, Buspar, Lamotrigine - Not followed by Psychiatry or therapist      10/23/2023    3:38 PM 08/21/2023    1:18 PM 12/15/2022   10:48 AM  Depression screen PHQ 2/9  Decreased Interest 2 1 1   Down, Depressed, Hopeless 2 1 1   PHQ - 2 Score 4 2 2   Altered sleeping 1 2 1   Tired, decreased energy 2 2   Change in appetite 2 1 1   Feeling bad or failure about yourself  2 1 0  Trouble concentrating 2 0 0  Moving slowly or fidgety/restless 2 0 0  Suicidal thoughts 2 0 0  PHQ-9 Score 17 8 4   Difficult doing work/chores Somewhat difficult  Not difficult at all       10/23/2023    3:38 PM 08/21/2023  1:19 PM 12/15/2022   10:48 AM 06/30/2022    2:38 PM  GAD 7 : Generalized Anxiety Score  Nervous, Anxious, on Edge 2 2 1 2   Control/stop worrying 3 1 1 2   Worry too much - different things 3 2 1 2   Trouble relaxing 3 1 0 1  Restless 2 0 0 0  Easily annoyed or irritable 2 1 0 0  Afraid - awful might happen 3 1 0 2  Total GAD 7 Score 18 8 3 9   Anxiety Difficulty Very difficult  Not difficult at all Not  difficult at all    Social History   Tobacco Use   Smoking status: Former    Current packs/day: 0.00    Average packs/day: 0.3 packs/day for 5.0 years (1.3 ttl pk-yrs)    Types: Cigarettes    Start date: 07/25/1996    Quit date: 07/25/2001    Years since quitting: 22.3   Smokeless tobacco: Never  Vaping Use   Vaping status: Never Used  Substance Use Topics   Alcohol use: Yes    Alcohol/week: 0.0 standard drinks of alcohol    Comment: 2 glasses of wine per week   Drug use: No    Review of Systems Per HPI unless specifically indicated above     Objective:    BP 110/78   Pulse (!) 115   Ht 5\' 2"  (1.575 m)   Wt 217 lb (98.4 kg)   SpO2 98%   BMI 39.69 kg/m   Wt Readings from Last 3 Encounters:  11/30/23 217 lb (98.4 kg)  10/23/23 218 lb (98.9 kg)  08/21/23 218 lb (98.9 kg)    Physical Exam Vitals and nursing note reviewed.  Constitutional:      General: She is not in acute distress.    Appearance: Normal appearance. She is well-developed. She is not diaphoretic.     Comments: Well-appearing, comfortable, cooperative  HENT:     Head: Normocephalic and atraumatic.  Eyes:     General:        Right eye: No discharge.        Left eye: No discharge.     Conjunctiva/sclera: Conjunctivae normal.  Cardiovascular:     Rate and Rhythm: Normal rate.  Pulmonary:     Effort: Pulmonary effort is normal.  Skin:    General: Skin is warm and dry.     Findings: No erythema or rash.  Neurological:     Mental Status: She is alert and oriented to person, place, and time.  Psychiatric:        Mood and Affect: Mood normal.        Behavior: Behavior normal.        Thought Content: Thought content normal.     Comments: Well groomed, good eye contact, normal speech and thoughts     Results for orders placed or performed in visit on 11/30/23  POCT Urinalysis Dipstick   Collection Time: 11/30/23 10:37 AM  Result Value Ref Range   Color, UA yellow    Clarity, UA cloudy     Glucose, UA Negative Negative   Bilirubin, UA small    Ketones, UA negative    Spec Grav, UA 1.025 1.010 - 1.025   Blood, UA negative    pH, UA 5.0 5.0 - 8.0   Protein, UA Negative Negative   Urobilinogen, UA 0.2 0.2 or 1.0 E.U./dL   Nitrite, UA negative    Leukocytes, UA Negative Negative   Appearance  Odor positive       Assessment & Plan:   Problem List Items Addressed This Visit     Anxiety   Relevant Medications   ALPRAZolam (XANAX) 0.5 MG tablet   Other Visit Diagnoses       Gastroesophageal reflux disease without esophagitis    -  Primary   Relevant Medications   pantoprazole (PROTONIX) 40 MG tablet   sucralfate (CARAFATE) 1 g tablet     Dysuria       Relevant Orders   POCT Urinalysis Dipstick (Completed)     Pain of left breast       Relevant Orders   Ambulatory referral to General Surgery     Nausea and vomiting, unspecified vomiting type       Relevant Medications   pantoprazole (PROTONIX) 40 MG tablet   sucralfate (CARAFATE) 1 g tablet     Breast fibroadenoma in female, left       Relevant Orders   Ambulatory referral to General Surgery        Atrophic Vaginitis Improvement in itching and irritation with Estrace cream. -Continue Estrace cream every other day as maintenance therapy.  Tobacco Use Disorder Reduced smoking from 5 to 3 cigarettes per day. -Encouraged to continue reducing cigarette use.  Breast Pain Recent abnormal mammogram w/ diagnostic, biopsy 06/2023, L breast Fibroadenoma New onset, sporadic, needle-like pain around the nipple of the left breast. -Referral to general surgery for further evaluation and management.  Gastroesophageal Reflux Disease (GERD) and Irritable Bowel Syndrome (IBS) Upper and lower digestive symptoms including acid reflux, nausea, vomiting, urgency, diarrhea, and cramping. Suspect some is anxiety stress related, possible IBS -Start Pantoprazole 40mg  daily before breakfast for GERD. (On med years  prior) -Start Sucralfate as needed for symptom relief. (Instead of TUMS) -Try over-the-counter peppermint oil capsules for IBS symptoms.  Anxiety Managed with Alprazolam 0.5mg  as needed, up to 2 times per day. -Continue current regimen of Alprazolam.  -Negative urine test, no signs of infection.        Orders Placed This Encounter  Procedures   Ambulatory referral to General Surgery    Referral Priority:   Routine    Referral Type:   Surgical    Referral Reason:   Specialty Services Required    Requested Specialty:   General Surgery    Number of Visits Requested:   1   POCT Urinalysis Dipstick    Meds ordered this encounter  Medications   pantoprazole (PROTONIX) 40 MG tablet    Sig: Take 1 tablet (40 mg total) by mouth daily before breakfast.    Dispense:  30 tablet    Refill:  2   sucralfate (CARAFATE) 1 g tablet    Sig: Take 1 tablet (1 g total) by mouth 4 (four) times daily -  with meals and at bedtime. As needed for acid reflux nausea    Dispense:  30 tablet    Refill:  2   ALPRAZolam (XANAX) 0.5 MG tablet    Sig: Take 1 tablet (0.5 mg total) by mouth 2 (two) times daily as needed for anxiety.    Dispense:  30 tablet    Refill:  2    Add future refills    Follow up plan: Return if symptoms worsen or fail to improve.  Saralyn Pilar, DO [facility] 11/30/2023, 11:09 AM

## 2023-11-30 NOTE — Patient Instructions (Addendum)
Thank you for coming to the office today.  Referral to General Surgeon for the breast pain.   Continue estrogen cream for now as ordered.  IBgard OTC Peppermint Oil (Triple Coated Capsule) 180mg  take one 3 times daily to reduce diarrhea symptoms, and if you are doing better can go down to 1 pill per day for prevention  Acid Reflux upper symptoms Starting tomorrow before breakfast Pantoprazole/Protonix 40mg  daily. Prefer to take this med about 30 min before breakfast or 1st meal of day   Take other prescribed medicine Carafate (Sucralfate) as needed up to 4 times daily (3 meals and bedtime)  to coat stomach lining to ease symptoms, if it helps reduce symptoms then it is more likely to be due to acid and/or ulcer.    DIET RECOMMENDATIONS - Avoid spicy, greasy, fried foods, also things like caffeine, dark chocolate, peppermint can worsen - Avoid large meals and late night snacks, also do not go more than 4-5 hours without a snack or meal (not eating will worsen reflux symptoms due to stomach acid) - You may also elevate the head of your bed at night to sleep at very slight incline to help reduce symptoms      Please schedule a Follow-up Appointment to: Return if symptoms worsen or fail to improve.  If you have any other questions or concerns, please feel free to call the office or send a message through MyChart. You may also schedule an earlier appointment if necessary.  Additionally, you may be receiving a survey about your experience at our office within a few days to 1 week by e-mail or mail. We value your feedback.  Saralyn Pilar, DO [facility], [place]

## 2023-12-06 DIAGNOSIS — H524 Presbyopia: Secondary | ICD-10-CM | POA: Diagnosis not present

## 2023-12-06 DIAGNOSIS — Z0100 Encounter for examination of eyes and vision without abnormal findings: Secondary | ICD-10-CM | POA: Diagnosis not present

## 2023-12-10 ENCOUNTER — Ambulatory Visit: Payer: Self-pay | Admitting: Family Medicine

## 2023-12-10 ENCOUNTER — Encounter: Payer: Self-pay | Admitting: Family Medicine

## 2023-12-10 ENCOUNTER — Ambulatory Visit (INDEPENDENT_AMBULATORY_CARE_PROVIDER_SITE_OTHER): Admitting: Family Medicine

## 2023-12-10 VITALS — BP 110/76 | HR 88 | Ht 62.0 in | Wt 218.0 lb

## 2023-12-10 DIAGNOSIS — R300 Dysuria: Secondary | ICD-10-CM | POA: Diagnosis not present

## 2023-12-10 DIAGNOSIS — K219 Gastro-esophageal reflux disease without esophagitis: Secondary | ICD-10-CM | POA: Diagnosis not present

## 2023-12-10 DIAGNOSIS — N3000 Acute cystitis without hematuria: Secondary | ICD-10-CM | POA: Diagnosis not present

## 2023-12-10 MED ORDER — SULFAMETHOXAZOLE-TRIMETHOPRIM 800-160 MG PO TABS
1.0000 | ORAL_TABLET | Freq: Two times a day (BID) | ORAL | 0 refills | Status: AC
Start: 2023-12-10 — End: 2023-12-17

## 2023-12-10 NOTE — Telephone Encounter (Signed)
 Copied from CRM [id number]. Topic: Clinical - Red Word Triage >> Dec 10, 2023  8:32 AM Elle L wrote: Red Word that prompted transfer to Nurse Triage: The patient was seen on 2/21 for a UTI but the urinalysis came back normal. However, she states that she is having worsening pain when urinating.   Chief Complaint: Suspected UTI Symptoms: Pain during urination, frequency  Frequency: Ongoing since Friday Pertinent Negatives: Patient denies pelvic pain, back pain, odor Disposition: [] ED /[] Urgent Care (no appt availability in office) / [x] Appointment(In office/virtual)/ []  Minor Virtual Care/ [] Home Care/ [] Refused Recommended Disposition /[] [place] [facility]/ []  Follow-up with PCP Additional Notes: Patient is having possible UTI symptoms that started on Friday. She is experiencing painful urination and frequency. Appointment scheduled for this afternoon.   Reason for Disposition  Urinating more frequently than usual (i.e., frequency)  Answer Assessment - Initial Assessment Questions 1. SYMPTOM: "What's the main symptom you're concerned about?" (e.g., frequency, incontinence)     Urinary pain and frequency   2. ONSET: "When did the the urinary symptoms start?"     Friday   3. PAIN: "Is there any pain?" If Yes, ask: "How bad is it?" (Scale: 1-10; mild, moderate, severe)     Moderate  4. CAUSE: "What do you think is causing the symptoms?"     UTI  5. OTHER SYMPTOMS: "Do you have any other symptoms?" (e.g., blood in urine, fever, flank pain, pain with urination)     No  Protocols used: Urinary Symptoms-A-AH

## 2023-12-10 NOTE — Progress Notes (Signed)
 Subjective:    Patient ID: [name], female    DOB: Apr 18, 1956, 68 y.o.   MRN: [id number]  [name] is a 68 y.o. female presenting on 12/10/2023 for Urinary Tract Infection   HPI  Discussed the use of AI scribe software for clinical note transcription with the patient, who gave verbal consent to proceed.  History of Present Illness   [name] is a 68 year old female who presents with urinary symptoms and dysuria.  She has been experiencing dysuria since late Friday, described as a burning sensation during urination. There is no fever or other systemic symptoms. She started taking Azo on Friday night for pain relief and continued its use through the weekend until Monday morning.  A urine test conducted ten days ago returned normal results despite similar symptoms. However, she notes that the pain is more intense this time compared to the previous episode, where she experienced increased frequency of urination without significant pain.  Her current medications include pantoprazole 40 mg daily for GERD, which she finds effective, and she does not frequently require additional as-needed medication. She has also tried peppermint oil for IBS, though she is uncertain of its efficacy and does not use it daily.      Morbid Obesity BMI >39 Complication with OSA, Hyperlipidemia History of CVA Interested in weight management options since ineffective diet lifestyle exercise regimen over past 6 months     12/10/2023    2:15 PM 10/23/2023    3:38 PM 08/21/2023    1:18 PM  Depression screen PHQ 2/9  Decreased Interest 1 2 1   Down, Depressed, Hopeless 2 2 1   PHQ - 2 Score 3 4 2   Altered sleeping 1 1 2   Tired, decreased energy 1 2 2   Change in appetite 2 2 1   Feeling bad or failure about yourself  1 2 1   Trouble concentrating 1 2 0  Moving slowly or fidgety/restless 0 2 0  Suicidal thoughts 1 2 0  PHQ-9 Score 10 17 8   Difficult doing work/chores Somewhat  difficult Somewhat difficult        12/10/2023    2:15 PM 10/23/2023    3:38 PM 08/21/2023    1:19 PM 12/15/2022   10:48 AM  GAD 7 : Generalized Anxiety Score  Nervous, Anxious, on Edge 2 2 2 1   Control/stop worrying 1 3 1 1   Worry too much - different things 1 3 2 1   Trouble relaxing 1 3 1  0  Restless 0 2 0 0  Easily annoyed or irritable 1 2 1  0  Afraid - awful might happen 1 3 1  0  Total GAD 7 Score 7 18 8 3   Anxiety Difficulty Somewhat difficult Very difficult  Not difficult at all    Social History   Tobacco Use   Smoking status: Former    Current packs/day: 0.00    Average packs/day: 0.3 packs/day for 5.0 years (1.3 ttl pk-yrs)    Types: Cigarettes    Start date: 07/25/1996    Quit date: 07/25/2001    Years since quitting: 22.3   Smokeless tobacco: Never  Vaping Use   Vaping status: Never Used  Substance Use Topics   Alcohol use: Yes    Alcohol/week: 0.0 standard drinks of alcohol    Comment: 2 glasses of wine per week   Drug use: No    Review of Systems Per HPI unless specifically indicated above     Objective:  BP 110/76   Pulse 88   Ht 5\' 2"  (1.575 m)   Wt 218 lb (98.9 kg)   SpO2 98%   BMI 39.87 kg/m   Wt Readings from Last 3 Encounters:  12/10/23 218 lb (98.9 kg)  11/30/23 217 lb (98.4 kg)  10/23/23 218 lb (98.9 kg)    Physical Exam Vitals and nursing note reviewed.  Constitutional:      General: She is not in acute distress.    Appearance: Normal appearance. She is well-developed. She is not diaphoretic.     Comments: Well-appearing, comfortable, cooperative  HENT:     Head: Normocephalic and atraumatic.  Eyes:     General:        Right eye: No discharge.        Left eye: No discharge.     Conjunctiva/sclera: Conjunctivae normal.  Cardiovascular:     Rate and Rhythm: Normal rate.  Pulmonary:     Effort: Pulmonary effort is normal.  Skin:    General: Skin is warm and dry.     Findings: No erythema or rash.  Neurological:     Mental  Status: She is alert and oriented to person, place, and time.  Psychiatric:        Mood and Affect: Mood normal.        Behavior: Behavior normal.        Thought Content: Thought content normal.     Comments: Well groomed, good eye contact, normal speech and thoughts     Results for orders placed or performed in visit on 11/30/23  POCT Urinalysis Dipstick   Collection Time: 11/30/23 10:37 AM  Result Value Ref Range   Color, UA yellow    Clarity, UA cloudy    Glucose, UA Negative Negative   Bilirubin, UA small    Ketones, UA negative    Spec Grav, UA 1.025 1.010 - 1.025   Blood, UA negative    pH, UA 5.0 5.0 - 8.0   Protein, UA Negative Negative   Urobilinogen, UA 0.2 0.2 or 1.0 E.U./dL   Nitrite, UA negative    Leukocytes, UA Negative Negative   Appearance     Odor positive       Assessment & Plan:   Problem List Items Addressed This Visit   None Visit Diagnoses       Acute cystitis without hematuria    -  Primary   Relevant Medications   sulfamethoxazole-trimethoprim (BACTRIM DS) 800-160 MG tablet     Dysuria       Relevant Orders   Urinalysis, Routine w reflex microscopic   Urine Culture     Gastroesophageal reflux disease without esophagitis            Suspected UTI Cystitis / Dysuria Symptoms suggest possible UTI. Previous effective treatment with Bactrim.  Risk of kidney damage from AZO use discussed - Send urine sample for culture and analysis. - Prescribe Bactrim (sulfamethoxazole/trimethoprim) 1 double strength tablet twice daily for 7 days (note past history of Keflex resistance) - Advise to stop Azo due to potential kidney damage. - Consider referral to urologist if symptoms persist after treatment.  Gastroesophageal Reflux Disease (GERD) and Irritable Bowel Syndrome (IBS) GERD and IBS well-managed with current treatment. Peppermint oil use for IBS is uncertain in effectiveness. - Continue pantoprazole 40 mg daily for GERD. - Use peppermint oil as  needed for IBS.  Weight Management Discussed GLP1 and alternatives for weight management. Explained insurance process and potential costs. Meets  BMI criteria for medication. She has had unsuccessful weight loss in past >6 months despite lifestyle and diet exercise interventions - Advise to check with insurance for coverage and costs. - Consider compounded medication options if insurance coverage is inadequate.     Orders Placed This Encounter  Procedures   Urine Culture   Urinalysis, Routine w reflex microscopic    Meds ordered this encounter  Medications   sulfamethoxazole-trimethoprim (BACTRIM DS) 800-160 MG tablet    Sig: Take 1 tablet by mouth 2 (two) times daily for 7 days.    Dispense:  14 tablet    Refill:  0    Follow up plan: Return if symptoms worsen or fail to improve.    Saralyn Pilar, DO [facility] 12/10/2023, 2:09 PM

## 2023-12-10 NOTE — Patient Instructions (Addendum)
 Thank you for coming to the office today.  Check with insurance  Zepbound TO Check Cost & Coverage of Zepbound Please contact Research officer, trade union (manufacturer for [employer]) 1-800-LillyRx [phone]) - Live agent to discuss cost and coverage.   [email or web address] Verified [email or web address]      Alternative options   Semaglutide injection (mixed Ozempic) from [facility] 0.25mg  weekly for 4 weeks then increase to 0.5mg  weekly It comes in a vial and a needle syringe, you need to draw up the shot and self admin it weekly Cost is about $200 per month Call them to check pricing and availability   [facility] Address: [address] [place], [place], [place] [address] Phone: [phone]   -----------------   [email or web address]   [email or web address]   Materials engineer Team With insurance $79/mo + copay for medications Without insurance $79/mo + $99/mo to include medication (Semaglutide) Without insurance $79/mo + $199/mo to include medication (Tirzepatide) [email or web address]    Please schedule a Follow-up Appointment to: Return if symptoms worsen or fail to improve.  If you have any other questions or concerns, please feel free to call the office or send a message through MyChart. You may also schedule an earlier appointment if necessary.  Additionally, you may be receiving a survey about your experience at our office within a few days to 1 week by e-mail or mail. We value your feedback.  Janet Pilar, DO [facility], [place]

## 2023-12-11 ENCOUNTER — Encounter: Payer: Self-pay | Admitting: Family Medicine

## 2023-12-11 DIAGNOSIS — N952 Postmenopausal atrophic vaginitis: Secondary | ICD-10-CM

## 2023-12-11 LAB — URINALYSIS, ROUTINE W REFLEX MICROSCOPIC
Bilirubin Urine: NEGATIVE
Glucose, UA: NEGATIVE
Hgb urine dipstick: NEGATIVE
Hyaline Cast: NONE SEEN /LPF
Ketones, ur: NEGATIVE
Leukocytes,Ua: NEGATIVE
Nitrite: POSITIVE — AB
Protein, ur: NEGATIVE
RBC / HPF: NONE SEEN /HPF (ref 0–2)
Specific Gravity, Urine: 1.009 (ref 1.001–1.035)
WBC, UA: NONE SEEN /HPF (ref 0–5)
pH: 6.5 (ref 5.0–8.0)

## 2023-12-11 LAB — MICROSCOPIC MESSAGE

## 2023-12-11 LAB — URINE CULTURE
MICRO NUMBER:: 16153285
SPECIMEN QUALITY:: ADEQUATE

## 2023-12-13 ENCOUNTER — Ambulatory Visit: Payer: Medicare HMO | Admitting: General Surgery

## 2023-12-18 ENCOUNTER — Ambulatory Visit: Admitting: General Surgery

## 2023-12-18 NOTE — Progress Notes (Unsigned)
 PATIENT: [name] DOB: [birth year]  REASON FOR VISIT: Follow up HISTORY FROM: Patient PRIMARY NEUROLOGIST: [name] now that [name] is retired  Virtual Visit via Video Note  I connected with [name] on 12/19/23 at  1:45 PM EDT by a video enabled telemedicine application and verified that I am speaking with the correct person using two identifiers.  Location: Patient: at her home Provider: in the office    I discussed the limitations of evaluation and management by telemedicine and the availability of in person appointments. The patient expressed understanding and agreed to proceed.   HISTORY OF PRESENT ILLNESS: Today 12/19/23 Update 12/19/23 SS: No seizures, remains on Lamictal 100 mg twice daily. Had right knee replacement, needs left but doing cortisone shots. Health is fine. Remains active, reviewed labs from PCP.   Update 12/14/22 SS: Here with her husband. Doing well. No seizures.  Remains on Lamictal 100 mg twice a day.  Total has had 5 seizure since 2018, last was in 2019. Going through having dental implants placed in [place].  10/06/2021 SS: [name] here today for follow-up with history of subarachnoid hemorrhage associated with left frontal stroke in 2018, began having seizures 1 year later.  Has been treated with Keppra, reported side effect of drowsiness. Tapered off Keppra and switched to Lamictal 100 mg BID back in August. Doing much better, a few times some dizziness, but rests and goes away. Previous seizures before have been total loss of consciousness. Getting around well. Has lost about 15 lbs. Here today alone.   HISTORY 04/28/2021 [name]: [name] is a 68 year old right-handed white female with a history of a subarachnoid hemorrhage associated with a left frontal stroke in February 2018.  Within a year after the event, she began having seizures.  She has sleep apnea as well but is not on CPAP.  She has lost about 50 pounds of  weight and she no longer snores at night.  She is on Keppra, but even on the long-acting preparation she is reporting issues with drowsiness and cognitive slowing.  The patient has recently switched off of Prozac and has been switched to Effexor, she is being treated for depression.  She has not had any further seizures, she is operating a motor vehicle well.  She wishes to come off of the Keppra.  She does report some mild gait instability at times, she has not had any falls   REVIEW OF SYSTEMS: Out of a complete 14 system review of symptoms, the patient complains only of the following symptoms, and all other reviewed systems are negative.  See HPI  ALLERGIES: No Known Allergies  HOME MEDICATIONS: Outpatient Medications Prior to Visit  Medication Sig Dispense Refill   ALPRAZolam (XANAX) 0.5 MG tablet Take 1 tablet (0.5 mg total) by mouth 2 (two) times daily as needed for anxiety. 30 tablet 2   aspirin EC 81 MG tablet Take 81 mg by mouth daily. Swallow whole.     B Complex Vitamins (VITAMIN-B COMPLEX PO) Take by mouth daily.     busPIRone (BUSPAR) 5 MG tablet Take 1 tablet (5 mg total) by mouth 3 (three) times daily as needed (anxiety). 270 tablet 3   estradiol (ESTRACE) 0.1 MG/GM vaginal cream Apply 1 gram per vagina every night for 2 weeks, then apply three times a week 42.5 g 12   ibuprofen (ADVIL) 600 MG tablet Take 1 tablet (600 mg total) by mouth every 6 (six) hours as needed.  30 tablet 0   lamoTRIgine (LAMICTAL) 100 MG tablet Take 1 tablet (100 mg total) by mouth 2 (two) times daily. 180 tablet 4   Melatonin 3 MG TABS Take 6 mg by mouth at bedtime.      Multiple Vitamin (MULTIVITAMIN WITH MINERALS) TABS Take 1 tablet by mouth daily.      Omega-3 Fatty Acids (FISH OIL) 1200 MG CAPS Take 1 capsule by mouth daily.     pantoprazole (PROTONIX) 40 MG tablet Take 1 tablet (40 mg total) by mouth daily before breakfast. 30 tablet 2   rosuvastatin (CRESTOR) 10 MG tablet Take 1 tablet (10 mg  total) by mouth at bedtime. 90 tablet 3   sucralfate (CARAFATE) 1 g tablet Take 1 tablet (1 g total) by mouth 4 (four) times daily -  with meals and at bedtime. As needed for acid reflux nausea 30 tablet 2   venlafaxine XR (EFFEXOR-XR) 150 MG 24 hr capsule Take 1 capsule (150 mg total) by mouth daily at 6 (six) AM. 90 capsule 3   No facility-administered medications prior to visit.    PAST MEDICAL HISTORY: Past Medical History:  Diagnosis Date   Allergy    Anxiety    Arthritis    left hip   Blood transfusion without reported diagnosis    with hip surgery    GERD (gastroesophageal reflux disease)    Hepatitis 1972   hep B   Hyperlipidemia    controlled on meds    Memory disorder 09/21/2017   OSA (obstructive sleep apnea) 05/08/2018   PONV (postoperative nausea and vomiting)    Seizures (HCC)    last seizure 11-2016   Sleep apnea    no cpap    Stroke (HCC)    per pt     PAST SURGICAL HISTORY: Past Surgical History:  Procedure Laterality Date   ABDOMINAL HYSTERECTOMY  04/1999   ARTHROSCOPIC REPAIR ACL     bilateral knees   BREAST BIOPSY Right 07/05/2023   stereo bx, calcs, COIL clip-path pending   BREAST BIOPSY Left 07/05/2023   stereo bx, calcs, RIBBON clip-path pending   BREAST BIOPSY Left 07/05/2023   stereo bx, calcs, X clip-path pending   BREAST BIOPSY Right 07/05/2023   MM RT BREAST BX W LOC DEV 1ST LESION IMAGE BX SPEC STEREO GUIDE 07/05/2023 [facility]   BREAST BIOPSY Left 07/05/2023   MM LT BREAST BX W LOC DEV EA AD LESION IMG BX SPEC STEREO GUIDE 07/05/2023 [facility]   BREAST BIOPSY Left 07/05/2023   MM LT BREAST BX W LOC DEV EA AD LESION IMG BX SPEC STEREO GUIDE 07/05/2023 [facility]   BREAST EXCISIONAL BIOPSY Right 2008   NEG   BREAST EXCISIONAL BIOPSY Left YRS AGO   X2 - NEG   BREAST LUMPECTOMY  06/1979; 03/1995   left   CHOLECYSTECTOMY  09/1987   COLONOSCOPY     LAPAROSCOPIC GASTRIC SLEEVE RESECTION  09/30/2012   Procedure: LAPAROSCOPIC  GASTRIC SLEEVE RESECTION;  Surgeon: Lodema Pilot, DO;  Location: [facility];  Service: General;  Laterality: N/A;   POLYPECTOMY     TOTAL HIP ARTHROPLASTY  11/13/2012   left   UPPER GASTROINTESTINAL ENDOSCOPY     UPPER GI ENDOSCOPY  09/30/2012   Procedure: UPPER GI ENDOSCOPY;  Surgeon: Lodema Pilot, DO;  Location: [facility];  Service: General;;   XI ROBOTIC LAPAROSCOPIC ASSISTED APPENDECTOMY N/A 04/24/2022   Procedure: XI ROBOTIC LAPAROSCOPIC ASSISTED APPENDECTOMY;  Surgeon: Carolan Shiver, MD;  Location: [facility];  Service: General;  Laterality: N/A;    FAMILY HISTORY: Family History  Problem Relation Age of Onset   Hyperlipidemia Mother    Obesity Mother    Pulmonary fibrosis Mother    Cancer Maternal Aunt        ovarian cancer   Cancer Father    Prostate cancer Father    Colon cancer Paternal Grandmother    Esophageal cancer Neg Hx    Stomach cancer Neg Hx    Rectal cancer Neg Hx    Breast cancer Neg Hx    Colon polyps Neg Hx     SOCIAL HISTORY: Social History   Socioeconomic History   Marital status: Married    Spouse name: Not on file   Number of children: 0   Years of education: BS   Highest education level: Not on file  Occupational History   Not on file  Tobacco Use   Smoking status: Former    Current packs/day: 0.00    Average packs/day: 0.3 packs/day for 5.0 years (1.3 ttl pk-yrs)    Types: Cigarettes    Start date: 07/25/1996    Quit date: 07/25/2001    Years since quitting: 22.4   Smokeless tobacco: Never  Vaping Use   Vaping status: Never Used  Substance and Sexual Activity   Alcohol use: Yes    Alcohol/week: 0.0 standard drinks of alcohol    Comment: 2 glasses of wine per week   Drug use: No   Sexual activity: Not on file  Other Topics Concern   Not on file  Social History Narrative   Lives with spouse   Caffeine use: Drinks coffee daily   Right handed   Social Drivers of Health   Financial Resource Strain: Low Risk  (06/23/2022)    Overall Financial Resource Strain (CARDIA)    Difficulty of Paying Living Expenses: Not very hard  Food Insecurity: No Food Insecurity (06/23/2022)   Hunger Vital Sign    Worried About Running Out of Food in the Last Year: Never true    Ran Out of Food in the Last Year: Never true  Transportation Needs: No Transportation Needs (06/23/2022)   PRAPARE - Administrator, Civil Service (Medical): No    Lack of Transportation (Non-Medical): No  Physical Activity: Sufficiently Active (06/23/2022)   Exercise Vital Sign    Days of Exercise per Week: 4 days    Minutes of Exercise per Session: 40 min  Stress: No Stress Concern Present (06/23/2022)   [employer] of Occupational Health - Occupational Stress Questionnaire    Feeling of Stress : Only a little  Social Connections: Moderately Integrated (06/23/2022)   Social Connection and Isolation Panel [NHANES]    Frequency of Communication with Friends and Family: More than three times a week    Frequency of Social Gatherings with Friends and Family: Once a week    Attends Religious Services: More than 4 times per year    Active Member of [employer] or Organizations: No    Attends Banker Meetings: Never    Marital Status: Married  Catering manager Violence: Not At Risk (06/23/2022)   Humiliation, Afraid, Rape, and Kick questionnaire    Fear of Current or Ex-Partner: No    Emotionally Abused: No    Physically Abused: No    Sexually Abused: No   PHYSICAL EXAM  There were no vitals filed for this visit.   There is no height or weight on file to calculate BMI.  Generalized: Well developed,  in no acute distress   Via video visit, is alert and oriented, speech is clear and concise, facial symmetry noted, excellent historian, follows commands, moves about freely  DIAGNOSTIC DATA (LABS, IMAGING, TESTING) - I reviewed patient records, labs, notes, testing and imaging myself where available.  Lab Results  Component Value  Date   WBC 7.7 08/16/2023   HGB 12.2 08/16/2023   HCT 37.8 08/16/2023   MCV 98.2 08/16/2023   PLT 338 08/16/2023      Component Value Date/Time   NA 143 08/16/2023 0754   NA 140 11/18/2012 0530   K 4.2 08/16/2023 0754   K 4.0 11/18/2012 0530   CL 107 08/16/2023 0754   CL 108 (H) 11/18/2012 0530   CO2 28 08/16/2023 0754   CO2 27 11/18/2012 0530   GLUCOSE 101 08/16/2023 0754   GLUCOSE 95 11/18/2012 0530   BUN 13 08/16/2023 0754   BUN 8 11/18/2012 0530   CREATININE 1.05 08/16/2023 0754   CALCIUM 8.9 08/16/2023 0754   CALCIUM 7.9 (L) 11/18/2012 0530   PROT 6.0 (L) 08/16/2023 0754   ALBUMIN 3.7 04/24/2022 1113   AST 18 08/16/2023 0754   ALT 16 08/16/2023 0754   ALKPHOS 77 04/24/2022 1113   BILITOT 0.3 08/16/2023 0754   GFRNONAA >60 04/24/2022 1113   GFRNONAA 60 06/16/2020 0944   GFRAA 69 06/16/2020 0944   Lab Results  Component Value Date   CHOL 161 08/16/2023   HDL 70 08/16/2023   LDLCALC 73 08/16/2023   LDLDIRECT 146.6 12/19/2012   TRIG 94 08/16/2023   CHOLHDL 2.3 08/16/2023   Lab Results  Component Value Date   HGBA1C 5.3 08/16/2023   Lab Results  Component Value Date   VITAMINB12 485 08/16/2023   Lab Results  Component Value Date   TSH 2.71 08/16/2023   ASSESSMENT AND PLAN 68 y.o. year old female  has a past medical history of Allergy, Anxiety, Arthritis, Blood transfusion without reported diagnosis, GERD (gastroesophageal reflux disease), Hepatitis (1972), Hyperlipidemia, Memory disorder (09/21/2017), OSA (obstructive sleep apnea) (05/08/2018), PONV (postoperative nausea and vomiting), Seizures (HCC), Sleep apnea, and Stroke (HCC). here with:  History of seizures, well controlled  Subarachnoid hemorrhage associated with left frontal stroke in January 2018, subsequent seizures  -Continues to do well, last seizure was in 2019 -Continue Lamictal 100 mg twice a day -Lamictal level 4.10 September 2021, reviewed recent CBC, CMP from PCP -She would like to  discuss having her primary care refill her Lamictal, this should be fine if they are agreeable.  Otherwise we will see on an annual basis for medication refill  Otila Kluver, DNP 12/19/2023, 1:45 PM [facility] [address] [place], [place] [address] [phone]

## 2023-12-19 ENCOUNTER — Telehealth: Payer: Medicare HMO | Admitting: Neurology

## 2023-12-19 DIAGNOSIS — R569 Unspecified convulsions: Secondary | ICD-10-CM | POA: Diagnosis not present

## 2023-12-19 MED ORDER — LAMOTRIGINE 100 MG PO TABS: 100.0000 mg | ORAL_TABLET | Freq: Two times a day (BID) | ORAL | 4 refills | Status: DC

## 2023-12-19 NOTE — Patient Instructions (Signed)
 Great to see you today.  We will continue Lamictal for seizure prevention.  I have sent in a refill.  Please call for seizures.  Can discuss if your primary care doctor is willing to refill.  If not, please schedule an annual visit at our office.  Thanks!!

## 2023-12-20 MED ORDER — ESTRADIOL 0.1 MG/GM VA CREA
TOPICAL_CREAM | VAGINAL | 12 refills | Status: DC
Start: 2023-12-20 — End: 2024-08-27

## 2023-12-27 ENCOUNTER — Ambulatory Visit (INDEPENDENT_AMBULATORY_CARE_PROVIDER_SITE_OTHER): Admitting: General Surgery

## 2023-12-27 ENCOUNTER — Encounter: Payer: Self-pay | Admitting: General Surgery

## 2023-12-27 VITALS — BP 106/73 | HR 72 | Ht 62.0 in | Wt 216.0 lb

## 2023-12-27 DIAGNOSIS — N644 Mastodynia: Secondary | ICD-10-CM

## 2023-12-27 NOTE — Progress Notes (Unsigned)
 Patient ID: [name], female   DOB: November 10, 1955, 68 y.o.   MRN: [id number] CC: Left Breast Pain History of Present Illness [name] is a 68 y.o. female with past medical history significant for history of stroke, anxiety who presents in consultation for left breast pain.  The patient had a mammogram that showed some concerning lesions back in the fall 2024.  She underwent biopsies of these and they were all consistent with fibroadenomatous changes without any evidence of malignancy.  She says since then she has developed left breast pain.  She describes the pain as sharp in nature and located at the nipple.  She says that the pain comes and goes quite quickly but she is unable to discern any pattern from this.  She denies any drainage from the nipple.  She denies any flaking or overlying skin changes at the nipple or on her breast.  She also denies any dimpling of the skin of the breast nor does she have any nipple discharge.  She does have a significant family history of a mom and aunt with ovarian cancer.  She has had a hysterectomy.  She had her menses at 15 and been pregnant 3 times but had 3 miscarriages.  She completes breast exams quite often and has not felt any lumps or bumps concerning to her.  She is a former smoker but quit.  She has tried to reduce her caffeine intake and she does not use anything for the pain.  Past Medical History Past Medical History:  Diagnosis Date  . Allergy   . Anxiety   . Arthritis    left hip  . Blood transfusion without reported diagnosis    with hip surgery   . GERD (gastroesophageal reflux disease)   . Hepatitis 1972   hep B  . Hyperlipidemia    controlled on meds   . Memory disorder 09/21/2017  . OSA (obstructive sleep apnea) 05/08/2018  . PONV (postoperative nausea and vomiting)   . Seizures (HCC)    last seizure 11-2016  . Sleep apnea    no cpap   . Stroke [facility])    per pt        Past Surgical History:  Procedure  Laterality Date  . ABDOMINAL HYSTERECTOMY  04/1999  . APPENDECTOMY    . ARTHROSCOPIC REPAIR ACL     bilateral knees  . BREAST BIOPSY Right 07/05/2023   stereo bx, calcs, COIL clip-path pending  . BREAST BIOPSY Left 07/05/2023   stereo bx, calcs, RIBBON clip-path pending  . BREAST BIOPSY Left 07/05/2023   stereo bx, calcs, X clip-path pending  . BREAST BIOPSY Right 07/05/2023   MM RT BREAST BX W LOC DEV 1ST LESION IMAGE BX SPEC STEREO GUIDE 07/05/2023 [facility]  . BREAST BIOPSY Left 07/05/2023   MM LT BREAST BX W LOC DEV EA AD LESION IMG BX SPEC STEREO GUIDE 07/05/2023 [facility]  . BREAST BIOPSY Left 07/05/2023   MM LT BREAST BX W LOC DEV EA AD LESION IMG BX SPEC STEREO GUIDE 07/05/2023 [facility]  . BREAST EXCISIONAL BIOPSY Right 2008   NEG  . BREAST EXCISIONAL BIOPSY Left YRS AGO   X2 - NEG  . BREAST LUMPECTOMY  06/1979; 03/1995   left  . CHOLECYSTECTOMY  09/1987  . COLONOSCOPY    . LAPAROSCOPIC GASTRIC SLEEVE RESECTION  09/30/2012   Procedure: LAPAROSCOPIC GASTRIC SLEEVE RESECTION;  Surgeon: Lodema Pilot, DO;  Location: [facility];  Service: General;  Laterality: N/A;  . POLYPECTOMY    .  TOTAL HIP ARTHROPLASTY  11/13/2012   left  . UPPER GASTROINTESTINAL ENDOSCOPY    . UPPER GI ENDOSCOPY  09/30/2012   Procedure: UPPER GI ENDOSCOPY;  Surgeon: Lodema Pilot, DO;  Location: [facility];  Service: General;;  . XI ROBOTIC LAPAROSCOPIC ASSISTED APPENDECTOMY N/A 04/24/2022   Procedure: XI ROBOTIC LAPAROSCOPIC ASSISTED APPENDECTOMY;  Surgeon: Carolan Shiver, MD;  Location: [facility];  Service: General;  Laterality: N/A;    No Known Allergies  Current Outpatient Medications  Medication Sig Dispense Refill  . ALPRAZolam (XANAX) 0.5 MG tablet Take 1 tablet (0.5 mg total) by mouth 2 (two) times daily as needed for anxiety. 30 tablet 2  . aspirin EC 81 MG tablet Take 81 mg by mouth daily. Swallow whole.    . B Complex Vitamins (VITAMIN-B COMPLEX PO) Take by mouth daily.     . busPIRone (BUSPAR) 5 MG tablet Take 1 tablet (5 mg total) by mouth 3 (three) times daily as needed (anxiety). 270 tablet 3  . estradiol (ESTRACE) 0.1 MG/GM vaginal cream Apply 1 gram per vagina every night for 2 weeks, then apply three times a week 42.5 g 12  . ibuprofen (ADVIL) 600 MG tablet Take 1 tablet (600 mg total) by mouth every 6 (six) hours as needed. 30 tablet 0  . lamoTRIgine (LAMICTAL) 100 MG tablet Take 1 tablet (100 mg total) by mouth 2 (two) times daily. 180 tablet 4  . Multiple Vitamin (MULTIVITAMIN WITH MINERALS) TABS Take 1 tablet by mouth daily.     . Omega-3 Fatty Acids (FISH OIL) 1200 MG CAPS Take 1 capsule by mouth daily.    . pantoprazole (PROTONIX) 40 MG tablet Take 1 tablet (40 mg total) by mouth daily before breakfast. 30 tablet 2  . rosuvastatin (CRESTOR) 10 MG tablet Take 1 tablet (10 mg total) by mouth at bedtime. 90 tablet 3  . sucralfate (CARAFATE) 1 g tablet Take 1 tablet (1 g total) by mouth 4 (four) times daily -  with meals and at bedtime. As needed for acid reflux nausea 30 tablet 2  . venlafaxine XR (EFFEXOR-XR) 150 MG 24 hr capsule Take 1 capsule (150 mg total) by mouth daily at 6 (six) AM. 90 capsule 3   No current facility-administered medications for this visit.    Family History Family History  Problem Relation Age of Onset  . Hyperlipidemia Mother   . Obesity Mother   . Pulmonary fibrosis Mother   . Cancer Maternal Aunt        ovarian cancer  . Cancer Father   . Prostate cancer Father   . Colon cancer Paternal Grandmother   . Esophageal cancer Neg Hx   . Stomach cancer Neg Hx   . Rectal cancer Neg Hx   . Breast cancer Neg Hx   . Colon polyps Neg Hx        Social History Social History   Tobacco Use  . Smoking status: Former    Current packs/day: 0.00    Average packs/day: 0.3 packs/day for 5.0 years (1.3 ttl pk-yrs)    Types: Cigarettes    Start date: 07/25/1996    Quit date: 07/25/2001    Years since quitting: 22.4    Passive  exposure: Past  . Smokeless tobacco: Never  Vaping Use  . Vaping status: Never Used  Substance Use Topics  . Alcohol use: Yes    Alcohol/week: 0.0 standard drinks of alcohol    Comment: 2 glasses of wine per week  . Drug use:  No        ROS Full ROS of systems performed and is otherwise negative there than what is stated in the HPI  Physical Exam Blood pressure 106/73, pulse 72, height 5\' 2"  (1.575 m), weight 216 lb (98 kg), SpO2 96%.    Data Reviewed ***  I have personally reviewed the patient's imaging and medical records.    Assessment    ***  Plan    ***    Kandis Cocking 12/27/2023, 2:44 PM

## 2023-12-27 NOTE — Patient Instructions (Signed)
 You may also try Evening Primrose oil, and limit your caffeine intake.   Follow-up with our office as needed.  Please call and ask to speak with a nurse if you develop questions or concerns.   Breast Tenderness Breast tenderness is a common problem for women of all ages, but may also occur in men. Breast tenderness has many possible causes, including hormone changes, infections, taking certain medicines, and caffeine intake. In women, the pain usually comes and goes with the menstrual cycle, but it can also be constant. Breast tenderness may range from mild discomfort to severe pain. You may have tests, such as a mammogram or an ultrasound, to check for any unusual findings. Having breast tenderness usually does not mean that you have breast cancer. Follow these instructions at home: Managing pain and discomfort  If directed, put ice on the painful area. To do this: Put ice in a plastic bag. Place a towel between your skin and the bag. Leave the ice on for 20 minutes, 2-3 times a day. If your skin turns bright red, remove the ice right away to prevent skin damage. The risk of skin damage is higher if you cannot feel pain, heat, or cold. Wear a supportive bra or chest support: During exercise. While sleeping, if your breasts are very tender. Medicines Take over-the-counter and prescription medicines only as told by your health care provider. If the cause of your pain is an infection, you may be prescribed an antibiotic medicine. If you were prescribed antibiotics, take them as told by your health care provider. Do not stop using the antibiotic even if you start to feel better. Eating and drinking Decrease the amount of caffeine in your diet. Instead, drink more water and choose caffeine-free drinks. Your health care provider may recommend that you lessen the amount of fat in your diet. You can do this by: Limiting fried foods. Cooking foods using methods such as baking, boiling, grilling,  and broiling. General instructions  Keep a log of the days and times when your breasts are most tender. Ask your health care provider how to do breast exams at home. This will help you notice if you have an unusual growth or lump. Keep all follow-up visits. Contact a health care provider if: Any part of your breast is hard, red, and hot to the touch. This may be a sign of infection. You are a woman and have a new or painful lump in your breast that remains after your menstrual period ends. You are not breastfeeding and you have fluid, especially blood or pus, coming out of your nipples. You have a fever. Your pain does not improve or it gets worse. Your pain is interfering with your daily activities. Summary Breast tenderness may range from mild discomfort to severe pain. Breast tenderness has many possible causes, including hormone changes, infections, taking certain medicines, and caffeine intake. It can be treated with ice, wearing a supportive bra or chest support, and medicines. Make changes to your diet as told by your health care provider. This information is not intended to replace advice given to you by your health care provider. Make sure you discuss any questions you have with your health care provider. Document Revised: 12/07/2021 Document Reviewed: 12/07/2021 Elsevier Patient Education  2024 [employer].

## 2024-01-04 ENCOUNTER — Ambulatory Visit: Payer: Medicare HMO

## 2024-01-04 DIAGNOSIS — Z0000 Encounter for general adult medical examination without abnormal findings: Secondary | ICD-10-CM | POA: Diagnosis not present

## 2024-01-04 NOTE — Patient Instructions (Addendum)
 Ms. [name] , Thank you for taking time to come for your Medicare Wellness Visit. I appreciate your ongoing commitment to your health goals. Please review the following plan we discussed and let me know if I can assist you in the future.   Referrals/Orders/Follow-Ups/Clinician Recommendations: NONE  This is a list of the screening recommended for you and due dates:  Health Maintenance  Topic Date Due   DTaP/Tdap/Td vaccine (2 - Tdap) 04/27/2021   Pneumonia Vaccine (1 of 1 - PCV) Never done   DEXA scan (bone density measurement)  Never done   COVID-19 Vaccine (5 - 2024-25 season) 06/10/2023   Mammogram  06/26/2024   Medicare Annual Wellness Visit  01/03/2025   Colon Cancer Screening  03/16/2028   Flu Shot  Completed   Hepatitis C Screening  Completed   Zoster (Shingles) Vaccine  Completed   HPV Vaccine  Aged Out    Advanced directives: (ACP Link)Information on Advanced Care Planning can be found at [facility] of [place] Advance Health Care Directives Advance Health Care Directives. [email or web address]   Next Medicare Annual Wellness Visit scheduled for next year: Yes   01/09/25 @ 3:20 PM BY PHONE

## 2024-01-04 NOTE — Progress Notes (Signed)
 Subjective:   [name] is a 68 y.o. who presents for a Medicare Wellness preventive visit.  Visit Complete: Virtual I connected with  [name] on 01/04/24 by a audio enabled telemedicine application and verified that I am speaking with the correct person using two identifiers.  Patient Location: Home  Provider Location: Office/Clinic  I discussed the limitations of evaluation and management by telemedicine. The patient expressed understanding and agreed to proceed.  Vital Signs: Because this visit was a virtual/telehealth visit, some criteria may be missing or patient reported. Any vitals not documented were not able to be obtained and vitals that have been documented are patient reported.  VideoError- Librarian, academic were attempted between this provider and patient, however failed, due to patient having technical difficulties OR patient did not have access to video capability.  We continued and completed visit with audio only.   Persons Participating in Visit: Patient.  AWV Questionnaire: No: Patient Medicare AWV questionnaire was not completed prior to this visit.  Cardiac Risk Factors include: advanced age (>81men, >6 women);hypertension;dyslipidemia;obesity (BMI >30kg/m2)     Objective:    There were no vitals filed for this visit. There is no height or weight on file to calculate BMI.     01/04/2024    4:04 PM 08/12/2022    8:16 AM 06/25/2022   10:11 AM 06/23/2022    3:16 PM 06/15/2022    9:01 AM 04/24/2022    5:36 PM 04/24/2022    2:43 PM  Advanced Directives  Does Patient Have a Medical Advance Directive? No No No No No No No  Would patient like information on creating a medical advance directive? No - Patient declined   No - Patient declined  No - Patient declined No - Patient declined    Current Medications (verified) Outpatient Encounter Medications as of 01/04/2024  Medication Sig   ALPRAZolam (XANAX) 0.5 MG  tablet Take 1 tablet (0.5 mg total) by mouth 2 (two) times daily as needed for anxiety.   B Complex Vitamins (VITAMIN-B COMPLEX PO) Take by mouth daily.   busPIRone (BUSPAR) 5 MG tablet Take 1 tablet (5 mg total) by mouth 3 (three) times daily as needed (anxiety).   estradiol (ESTRACE) 0.1 MG/GM vaginal cream Apply 1 gram per vagina every night for 2 weeks, then apply three times a week   ibuprofen (ADVIL) 600 MG tablet Take 1 tablet (600 mg total) by mouth every 6 (six) hours as needed.   lamoTRIgine (LAMICTAL) 100 MG tablet Take 1 tablet (100 mg total) by mouth 2 (two) times daily.   Multiple Vitamin (MULTIVITAMIN WITH MINERALS) TABS Take 1 tablet by mouth daily.    Omega-3 Fatty Acids (FISH OIL) 1200 MG CAPS Take 1 capsule by mouth daily.   pantoprazole (PROTONIX) 40 MG tablet Take 1 tablet (40 mg total) by mouth daily before breakfast.   rosuvastatin (CRESTOR) 10 MG tablet Take 1 tablet (10 mg total) by mouth at bedtime.   sucralfate (CARAFATE) 1 g tablet Take 1 tablet (1 g total) by mouth 4 (four) times daily -  with meals and at bedtime. As needed for acid reflux nausea   venlafaxine XR (EFFEXOR-XR) 150 MG 24 hr capsule Take 1 capsule (150 mg total) by mouth daily at 6 (six) AM.   aspirin EC 81 MG tablet Take 81 mg by mouth daily. Swallow whole. (Patient not taking: Reported on 01/04/2024)   No facility-administered encounter medications on file as of 01/04/2024.  Allergies (verified) Patient has no allergy information on record.   History: Past Medical History:  Diagnosis Date   Allergy    Anxiety    Arthritis    left hip   Blood transfusion without reported diagnosis    with hip surgery    GERD (gastroesophageal reflux disease)    Hepatitis 1972   hep B   Hyperlipidemia    controlled on meds    Memory disorder 09/21/2017   OSA (obstructive sleep apnea) 05/08/2018   PONV (postoperative nausea and vomiting)    Seizures (HCC)    last seizure 11-2016   Sleep apnea    no cpap     Stroke (HCC)    per pt    Past Surgical History:  Procedure Laterality Date   ABDOMINAL HYSTERECTOMY  04/1999   APPENDECTOMY     ARTHROSCOPIC REPAIR ACL     bilateral knees   BREAST BIOPSY Right 07/05/2023   stereo bx, calcs, COIL clip-path pending   BREAST BIOPSY Left 07/05/2023   stereo bx, calcs, RIBBON clip-path pending   BREAST BIOPSY Left 07/05/2023   stereo bx, calcs, X clip-path pending   BREAST BIOPSY Right 07/05/2023   MM RT BREAST BX W LOC DEV 1ST LESION IMAGE BX SPEC STEREO GUIDE 07/05/2023 [facility]   BREAST BIOPSY Left 07/05/2023   MM LT BREAST BX W LOC DEV EA AD LESION IMG BX SPEC STEREO GUIDE 07/05/2023 [facility]   BREAST BIOPSY Left 07/05/2023   MM LT BREAST BX W LOC DEV EA AD LESION IMG BX SPEC STEREO GUIDE 07/05/2023 [facility]   BREAST EXCISIONAL BIOPSY Right 2008   NEG   BREAST EXCISIONAL BIOPSY Left YRS AGO   X2 - NEG   BREAST LUMPECTOMY  06/1979; 03/1995   left   CHOLECYSTECTOMY  09/1987   COLONOSCOPY     LAPAROSCOPIC GASTRIC SLEEVE RESECTION  09/30/2012   Procedure: LAPAROSCOPIC GASTRIC SLEEVE RESECTION;  Surgeon: Lodema Pilot, DO;  Location: [facility];  Service: General;  Laterality: N/A;   POLYPECTOMY     TOTAL HIP ARTHROPLASTY  11/13/2012   left   UPPER GASTROINTESTINAL ENDOSCOPY     UPPER GI ENDOSCOPY  09/30/2012   Procedure: UPPER GI ENDOSCOPY;  Surgeon: Lodema Pilot, DO;  Location: [facility];  Service: General;;   XI ROBOTIC LAPAROSCOPIC ASSISTED APPENDECTOMY N/A 04/24/2022   Procedure: XI ROBOTIC LAPAROSCOPIC ASSISTED APPENDECTOMY;  Surgeon: Carolan Shiver, MD;  Location: [facility];  Service: General;  Laterality: N/A;   Family History  Problem Relation Age of Onset   Hyperlipidemia Mother    Obesity Mother    Pulmonary fibrosis Mother    Cancer Maternal Aunt        ovarian cancer   Cancer Father    Prostate cancer Father    Colon cancer Paternal Grandmother    Esophageal cancer Neg Hx    Stomach cancer Neg Hx     Rectal cancer Neg Hx    Breast cancer Neg Hx    Colon polyps Neg Hx    Social History   Socioeconomic History   Marital status: Married    Spouse name: Not on file   Number of children: 0   Years of education: BS   Highest education level: Not on file  Occupational History   Not on file  Tobacco Use   Smoking status: Former    Current packs/day: 0.00    Average packs/day: 0.3 packs/day for 5.0 years (1.3 ttl pk-yrs)    Types: Cigarettes  Start date: 07/25/1996    Quit date: 07/25/2001    Years since quitting: 22.4    Passive exposure: Past   Smokeless tobacco: Never  Vaping Use   Vaping status: Never Used  Substance and Sexual Activity   Alcohol use: Yes    Alcohol/week: 0.0 standard drinks of alcohol    Comment: 2 glasses of wine per week   Drug use: No   Sexual activity: Not on file  Other Topics Concern   Not on file  Social History Narrative   Lives with spouse   Caffeine use: Drinks coffee daily   Right handed   Social Drivers of Health   Financial Resource Strain: Low Risk  (01/04/2024)   Overall Financial Resource Strain (CARDIA)    Difficulty of Paying Living Expenses: Not hard at all  Food Insecurity: No Food Insecurity (01/04/2024)   Hunger Vital Sign    Worried About Running Out of Food in the Last Year: Never true    Ran Out of Food in the Last Year: Never true  Transportation Needs: No Transportation Needs (01/04/2024)   PRAPARE - Administrator, Civil Service (Medical): No    Lack of Transportation (Non-Medical): No  Physical Activity: Insufficiently Active (01/04/2024)   Exercise Vital Sign    Days of Exercise per Week: 4 days    Minutes of Exercise per Session: 30 min  Stress: Stress Concern Present (01/04/2024)   [employer] of Occupational Health - Occupational Stress Questionnaire    Feeling of Stress : Rather much  Social Connections: Moderately Integrated (01/04/2024)   Social Connection and Isolation Panel [NHANES]     Frequency of Communication with Friends and Family: More than three times a week    Frequency of Social Gatherings with Friends and Family: Never    Attends Religious Services: More than 4 times per year    Active Member of [employer] or Organizations: No    Attends Engineer, structural: Never    Marital Status: Married    Tobacco Counseling Counseling given: Not Answered    Clinical Intake:  Pre-visit preparation completed: Yes  Pain : No/denies pain     BMI - recorded: 34 Nutritional Status: BMI > 30  Obese Nutritional Risks: None Diabetes: No  Lab Results  Component Value Date   HGBA1C 5.3 08/16/2023   HGBA1C 5.0 06/23/2022   HGBA1C 5.1 06/20/2021     How often do you need to have someone help you when you read instructions, pamphlets, or other written materials from your doctor or pharmacy?: 1 - Never  Interpreter Needed?: No  Information entered by :: Kennedy Bucker, LPN   Activities of Daily Living     01/04/2024    4:05 PM 08/21/2023    1:20 PM  In your present state of health, do you have any difficulty performing the following activities:  Hearing? 0 0  Vision? 0 0  Difficulty concentrating or making decisions? 0 1  Walking or climbing stairs? 1 0  Comment KNEE PAIN   Dressing or bathing? 0 0  Doing errands, shopping? 0 0  Preparing Food and eating ? N   Using the Toilet? N   In the past six months, have you accidently leaked urine? N   Do you have problems with loss of bowel control? N   Managing your Medications? N   Managing your Finances? N   Housekeeping or managing your Housekeeping? N     Patient Care Team: Smitty Cords,  DO as PCP - General (Family Medicine) Himmelrich, Loree Fee, RD (Inactive) as Dietitian (Bariatrics) Pa, [facility]  Indicate any recent Medical Services you may have received from other than [facility] providers in the past year (date may be approximate).     Assessment:   This is a routine  wellness examination for Teira.  Hearing/Vision screen Hearing Screening - Comments:: NO AIDS Vision Screening - Comments:: WEARS GLASSES ALL THE TIME- [facility]   Goals Addressed             This Visit's Progress    DIET - INCREASE WATER INTAKE         Depression Screen     01/04/2024    4:02 PM 12/10/2023    2:15 PM 10/23/2023    3:38 PM 08/21/2023    1:18 PM 12/15/2022   10:48 AM 06/30/2022    2:37 PM 06/23/2022    3:14 PM  PHQ 2/9 Scores  PHQ - 2 Score 1 3 4 2 2 2 1   PHQ- 9 Score 2 10 17 8 4 8 3     Fall Risk     01/04/2024    4:05 PM 12/27/2023    1:24 PM 12/10/2023    2:15 PM 10/23/2023    3:38 PM 08/21/2023    1:19 PM  Fall Risk   Falls in the past year? 1 0 1  1  Number falls in past yr: 1 0 0 1 1  Injury with Fall? 0 0 0 0 0  Follow up Falls evaluation completed;Falls prevention discussed        MEDICARE RISK AT HOME:  Medicare Risk at Home Any stairs in or around the home?: Yes If so, are there any without handrails?: No Home free of loose throw rugs in walkways, pet beds, electrical cords, etc?: Yes Adequate lighting in your home to reduce risk of falls?: Yes Life alert?: No Use of a cane, walker or w/c?: No Grab bars in the bathroom?: Yes Shower chair or bench in shower?: Yes Elevated toilet seat or a handicapped toilet?: Yes  TIMED UP AND GO:  Was the test performed?  No  Cognitive Function: 6CIT completed        01/04/2024    4:07 PM 06/23/2022    3:18 PM 04/19/2021    1:25 PM  6CIT Screen  What Year? 0 points 0 points 0 points  What month? 0 points 0 points 0 points  What time? 0 points 0 points 0 points  Count back from 20 0 points 0 points 0 points  Months in reverse 0 points 0 points 0 points  Repeat phrase 0 points 0 points 4 points  Total Score 0 points 0 points 4 points    Immunizations Immunization History  Administered Date(s) Administered   Fluad Quad(high Dose 65+) 06/23/2021   Influenza Inj Mdck Quad Pf 07/29/2018    Influenza Split 06/20/2012   Influenza,inj,Quad PF,6+ Mos 06/20/2019, 06/21/2020   Influenza-Unspecified 10/17/2016, 07/31/2017, 07/29/2018, 06/09/2022, 06/19/2023   PFIZER(Purple Top)SARS-COV-2 Vaccination 12/09/2019, 12/31/2019, 07/19/2020, 01/21/2021   Td 04/28/2011   Zoster Recombinant(Shingrix) 07/11/2021, 10/18/2021    Screening Tests Health Maintenance  Topic Date Due   DTaP/Tdap/Td (2 - Tdap) 04/27/2021   Pneumonia Vaccine 27+ Years old (1 of 1 - PCV) Never done   DEXA SCAN  Never done   COVID-19 Vaccine (5 - 2024-25 season) 06/10/2023   MAMMOGRAM  06/26/2024   Medicare Annual Wellness (AWV)  01/03/2025   Colonoscopy  03/16/2028   INFLUENZA VACCINE  Completed   Hepatitis C Screening  Completed   Zoster Vaccines- Shingrix  Completed   HPV VACCINES  Aged Out    Health Maintenance  Health Maintenance Due  Topic Date Due   DTaP/Tdap/Td (2 - Tdap) 04/27/2021   Pneumonia Vaccine 19+ Years old (1 of 1 - PCV) Never done   DEXA SCAN  Never done   COVID-19 Vaccine (5 - 2024-25 season) 06/10/2023   Health Maintenance Items Addressed: MAMMOGRAM UP TO DATE, COLONOSCOPY UP TO DATE, BDS ORDERED IN 08/21/23, SHOTS UP TO DATE EXCEPT TDAP  Additional Screening:  Vision Screening: Recommended annual ophthalmology exams for early detection of glaucoma and other disorders of the eye.  Dental Screening: Recommended annual dental exams for proper oral hygiene  Community Resource Referral / Chronic Care Management: CRR required this visit?  No   CCM required this visit?  No     Plan:     I have personally reviewed and noted the following in the patient's chart:   Medical and social history Use of alcohol, tobacco or illicit drugs  Current medications and supplements including opioid prescriptions. Patient is not currently taking opioid prescriptions. Functional ability and status Nutritional status Physical activity Advanced directives List of other  physicians Hospitalizations, surgeries, and ER visits in previous 12 months Vitals Screenings to include cognitive, depression, and falls Referrals and appointments  In addition, I have reviewed and discussed with patient certain preventive protocols, quality metrics, and best practice recommendations. A written personalized care plan for preventive services as well as general preventive health recommendations were provided to patient.     Hal Hope, LPN   1/61/0960   After Visit Summary: (MyChart) Due to this being a telephonic visit, the after visit summary with patients personalized plan was offered to patient via MyChart   Notes: Nothing significant to report at this time.

## 2024-02-06 ENCOUNTER — Other Ambulatory Visit: Payer: Self-pay | Admitting: Neurology

## 2024-02-13 ENCOUNTER — Other Ambulatory Visit: Payer: Self-pay | Admitting: Family Medicine

## 2024-02-13 DIAGNOSIS — F419 Anxiety disorder, unspecified: Secondary | ICD-10-CM

## 2024-02-15 ENCOUNTER — Telehealth: Payer: Self-pay

## 2024-02-15 DIAGNOSIS — F419 Anxiety disorder, unspecified: Secondary | ICD-10-CM

## 2024-02-15 NOTE — Telephone Encounter (Signed)
Refill on xanax

## 2024-02-15 NOTE — Telephone Encounter (Signed)
 She has 2 remaining refills on the prior prescription from 11/2023.  She just needs to call the pharmacy and have them fill this.

## 2024-02-15 NOTE — Telephone Encounter (Signed)
 Notified patient that she has 2 refills waiting at [facility].

## 2024-02-25 ENCOUNTER — Other Ambulatory Visit: Payer: Self-pay | Admitting: Family Medicine

## 2024-02-25 DIAGNOSIS — R112 Nausea with vomiting, unspecified: Secondary | ICD-10-CM

## 2024-02-25 DIAGNOSIS — K219 Gastro-esophageal reflux disease without esophagitis: Secondary | ICD-10-CM

## 2024-02-26 ENCOUNTER — Other Ambulatory Visit: Payer: Self-pay | Admitting: Medical Genetics

## 2024-02-26 ENCOUNTER — Ambulatory Visit
Admission: RE | Admit: 2024-02-26 | Discharge: 2024-02-26 | Disposition: A | Discharge status: Home / Self Care | Source: Ambulatory Visit | Attending: Family Medicine | Admitting: Family Medicine

## 2024-02-26 DIAGNOSIS — Z780 Asymptomatic menopausal state: Secondary | ICD-10-CM | POA: Insufficient documentation

## 2024-02-27 ENCOUNTER — Encounter: Payer: Self-pay | Admitting: Family Medicine

## 2024-02-27 ENCOUNTER — Ambulatory Visit: Payer: Self-pay | Admitting: Family Medicine

## 2024-02-27 DIAGNOSIS — M810 Age-related osteoporosis without current pathological fracture: Secondary | ICD-10-CM | POA: Insufficient documentation

## 2024-02-27 NOTE — Telephone Encounter (Signed)
 Requested by interface surescripts. Future visit in 6 months.  Requested Prescriptions  Pending Prescriptions Disp Refills   pantoprazole  (PROTONIX ) 40 MG tablet [Pharmacy Med Name: PANTOPRAZOLE  40MG  TABLETS] 30 tablet 2    Sig: TAKE 1 TABLET(40 MG) BY MOUTH DAILY BEFORE BREAKFAST     Gastroenterology: Proton Pump Inhibitors Passed - 02/27/2024 12:26 PM      Passed - Valid encounter within last 12 months    Recent Outpatient Visits           2 months ago Acute cystitis without hematuria   [place] [facility] [place], Kayleen Party, DO   2 months ago Gastroesophageal reflux disease without esophagitis   [place] [facility] [place], Kayleen Party, DO       Future Appointments             In 6 months [place], Kayleen Party, DO [place] [facility], [facility]

## 2024-03-20 ENCOUNTER — Telehealth: Payer: Self-pay

## 2024-03-20 NOTE — Telephone Encounter (Signed)
 Patient stopped by the office today to notify/inquire about her prior authorization for Estradiol  Vaginal Cream. Did not see any documentation in her chart. Please advise. Thanks!

## 2024-03-21 ENCOUNTER — Other Ambulatory Visit (HOSPITAL_COMMUNITY): Payer: Self-pay

## 2024-03-21 NOTE — Telephone Encounter (Signed)
 Pharmacy Patient Advocate Encounter   Received notification from Pt Calls Messages that prior authorization for estradiol  vaginal cream is required/requested.   Insurance verification completed.   The patient is insured through [place] .   Per test claim: Refill too soon. PA is not needed at this time. Medication was filled 02/25/24. Next eligible fill date is 03/25/24.

## 2024-03-31 ENCOUNTER — Ambulatory Visit (INDEPENDENT_AMBULATORY_CARE_PROVIDER_SITE_OTHER): Admitting: Internal Medicine

## 2024-03-31 ENCOUNTER — Encounter: Payer: Self-pay | Admitting: Internal Medicine

## 2024-03-31 VITALS — BP 110/68 | Ht 62.0 in | Wt 211.6 lb

## 2024-03-31 DIAGNOSIS — K582 Mixed irritable bowel syndrome: Secondary | ICD-10-CM | POA: Diagnosis not present

## 2024-03-31 DIAGNOSIS — F419 Anxiety disorder, unspecified: Secondary | ICD-10-CM

## 2024-03-31 DIAGNOSIS — M170 Bilateral primary osteoarthritis of knee: Secondary | ICD-10-CM

## 2024-03-31 DIAGNOSIS — R3911 Hesitancy of micturition: Secondary | ICD-10-CM

## 2024-03-31 DIAGNOSIS — F32A Depression, unspecified: Secondary | ICD-10-CM

## 2024-03-31 LAB — POCT URINE DIPSTICK
Blood, UA: NEGATIVE
Glucose, UA: NEGATIVE mg/dL
Ketones, POC UA: NEGATIVE mg/dL
Leukocytes, UA: NEGATIVE
Nitrite, UA: NEGATIVE
POC PROTEIN,UA: 30 — AB
Spec Grav, UA: 1.015 (ref 1.010–1.025)
Urobilinogen, UA: 0.2 U/dL
pH, UA: 6 (ref 5.0–8.0)

## 2024-03-31 NOTE — Patient Instructions (Signed)

## 2024-03-31 NOTE — Progress Notes (Signed)
 Subjective:    Patient ID: [name], female    DOB: 1956/02/24, 68 y.o.   MRN: [id number]  HPI  Discussed the use of AI scribe software for clinical note transcription with the patient, who gave verbal consent to proceed.  [name] is a 68 year old female who presents with concerns of increased anxiety, urinary hesitancy and abdominal cramping, alternating constipation and diarrhea..  She is scheduled for left knee replacement surgery next Thursday due to significant pain in her left knee, which she describes as 'really bad'. She has previously undergone right knee replacement and is eager for the surgery to alleviate her pain.  She has a history of anxiety and depression, currently managed with venlafaxine  150 mg daily, lamotrigine  100 mg twice daily for seizures, and buspirone  5 mg three times a day. She also takes Xanax  twice daily for anxiety, sometimes breaking the tablets in half to extend her supply. Xanax  aids her anxiety and sleep.  She does feel like her anxiety has worsened due to her upcoming scheduled surgery.  She has four tablets left and is concerned about having enough medication until her surgery.  She experiences symptoms consistent with irritable bowel syndrome, including constipation, diarrhea, and cramping that resolves after bowel movements.  She noticed this within the last few months.  No specific dietary triggers have been identified. No blood in stool, nausea, vomiting, or heartburn.  She is taking pantoprazole  as prescribed for reflux.  She has not been previously diagnosed with IBS.  She reports urinary hesitancy for several months, with an urge to urinate without being able to do so. No urinary urgency, frequency, dysuria, blood in her urine.  She has not noticed any leg swelling or shortness of breath. She maintains good fluid intake, primarily water and decaf tea.    Review of Systems   Past Medical History:  Diagnosis Date    Allergy    Anxiety    Arthritis    left hip   Blood transfusion without reported diagnosis    with hip surgery    GERD (gastroesophageal reflux disease)    Hepatitis 1972   hep B   Hyperlipidemia    controlled on meds    Memory disorder 09/21/2017   OSA (obstructive sleep apnea) 05/08/2018   PONV (postoperative nausea and vomiting)    Seizures (HCC)    last seizure 11-2016   Sleep apnea    no cpap    Stroke (HCC)    per pt     Current Outpatient Medications  Medication Sig Dispense Refill   ALPRAZolam  (XANAX ) 0.5 MG tablet Take 1 tablet (0.5 mg total) by mouth 2 (two) times daily as needed for anxiety. 30 tablet 2   aspirin EC 81 MG tablet Take 81 mg by mouth daily. Swallow whole. (Patient not taking: Reported on 01/04/2024)     B Complex Vitamins (VITAMIN-B COMPLEX PO) Take by mouth daily.     busPIRone  (BUSPAR ) 5 MG tablet Take 1 tablet (5 mg total) by mouth 3 (three) times daily as needed (anxiety). 270 tablet 3   estradiol  (ESTRACE ) 0.1 MG/GM vaginal cream Apply 1 gram per vagina every night for 2 weeks, then apply three times a week 42.5 g 12   ibuprofen  (ADVIL ) 600 MG tablet Take 1 tablet (600 mg total) by mouth every 6 (six) hours as needed. 30 tablet 0   lamoTRIgine  (LAMICTAL ) 100 MG tablet TAKE 1 TABLET TWICE DAILY 180 tablet 3   Multiple Vitamin (  MULTIVITAMIN WITH MINERALS) TABS Take 1 tablet by mouth daily.      Omega-3 Fatty Acids (FISH OIL) 1200 MG CAPS Take 1 capsule by mouth daily.     pantoprazole  (PROTONIX ) 40 MG tablet TAKE 1 TABLET(40 MG) BY MOUTH DAILY BEFORE BREAKFAST 30 tablet 2   rosuvastatin  (CRESTOR ) 10 MG tablet Take 1 tablet (10 mg total) by mouth at bedtime. 90 tablet 3   sucralfate  (CARAFATE ) 1 g tablet Take 1 tablet (1 g total) by mouth 4 (four) times daily -  with meals and at bedtime. As needed for acid reflux nausea 30 tablet 2   venlafaxine  XR (EFFEXOR -XR) 150 MG 24 hr capsule Take 1 capsule (150 mg total) by mouth daily at 6 (six) AM. 90 capsule 3    No current facility-administered medications for this visit.    Not on File  Family History  Problem Relation Age of Onset   Hyperlipidemia Mother    Obesity Mother    Pulmonary fibrosis Mother    Cancer Maternal Aunt        ovarian cancer   Cancer Father    Prostate cancer Father    Colon cancer Paternal Grandmother    Esophageal cancer Neg Hx    Stomach cancer Neg Hx    Rectal cancer Neg Hx    Breast cancer Neg Hx    Colon polyps Neg Hx     Social History   Socioeconomic History   Marital status: Married    Spouse name: Not on file   Number of children: 0   Years of education: BS   Highest education level: Not on file  Occupational History   Not on file  Tobacco Use   Smoking status: Former    Current packs/day: 0.00    Average packs/day: 0.3 packs/day for 5.0 years (1.3 ttl pk-yrs)    Types: Cigarettes    Start date: 07/25/1996    Quit date: 07/25/2001    Years since quitting: 22.6    Passive exposure: Past   Smokeless tobacco: Never  Vaping Use   Vaping status: Never Used  Substance and Sexual Activity   Alcohol use: Yes    Alcohol/week: 0.0 standard drinks of alcohol    Comment: 2 glasses of wine per week   Drug use: No   Sexual activity: Not on file  Other Topics Concern   Not on file  Social History Narrative   Lives with spouse   Caffeine use: Drinks coffee daily   Right handed   Social Drivers of Health   Financial Resource Strain: Low Risk  (01/04/2024)   Overall Financial Resource Strain (CARDIA)    Difficulty of Paying Living Expenses: Not hard at all  Food Insecurity: No Food Insecurity (01/04/2024)   Hunger Vital Sign    Worried About Running Out of Food in the Last Year: Never true    Ran Out of Food in the Last Year: Never true  Transportation Needs: No Transportation Needs (01/04/2024)   PRAPARE - Administrator, Civil Service (Medical): No    Lack of Transportation (Non-Medical): No  Physical Activity: Insufficiently  Active (01/04/2024)   Exercise Vital Sign    Days of Exercise per Week: 4 days    Minutes of Exercise per Session: 30 min  Stress: Stress Concern Present (01/04/2024)   [employer] of Occupational Health - Occupational Stress Questionnaire    Feeling of Stress : Rather much  Social Connections: Moderately Integrated (01/04/2024)   Social Connection  and Isolation Panel    Frequency of Communication with Friends and Family: More than three times a week    Frequency of Social Gatherings with Friends and Family: Never    Attends Religious Services: More than 4 times per year    Active Member of [employer] or Organizations: No    Attends Banker Meetings: Never    Marital Status: Married  Catering manager Violence: Not At Risk (01/04/2024)   Humiliation, Afraid, Rape, and Kick questionnaire    Fear of Current or Ex-Partner: No    Emotionally Abused: No    Physically Abused: No    Sexually Abused: No     Constitutional: Denies fever, malaise, fatigue, headache or abrupt weight changes.  Respiratory: Denies difficulty breathing, shortness of breath, cough or sputum production.   Cardiovascular: Denies chest pain, chest tightness, palpitations or swelling in the hands or feet.  Gastrointestinal: Patient reports intermittent abdominal cramping, alternating constipation and diarrhea.  Denies abdominal pain, bloating, or blood in the stool.  GU: Patient reports urinary hesitancy.  Denies urgency, frequency, pain with urination, burning sensation, blood in urine, odor or discharge. Musculoskeletal: Patient reports bilateral knee pain, L >R.  Denies decrease in range of motion, difficulty with gait, muscle pain.  Skin: Denies redness, rashes, lesions or ulcercations.  Neurological: Patient reports insomnia.  Denies dizziness, difficulty with memory, difficulty with speech or problems with balance and coordination.  Psych: Patient has a history of anxiety and depression.  Denies  SI/HI.  No other specific complaints in a complete review of systems (except as listed in HPI above).      Objective:   Physical Exam  BP 110/68 (BP Location: Left Arm, Patient Position: Sitting, Cuff Size: Large)   Ht 5' 2 (1.575 m)   Wt 211 lb 9.6 oz (96 kg)   BMI 38.70 kg/m   Wt Readings from Last 3 Encounters:  12/27/23 216 lb (98 kg)  12/10/23 218 lb (98.9 kg)  11/30/23 217 lb (98.4 kg)    General: Appears her stated age, obese, in NAD. Cardiovascular: Normal rate and rhythm. S1,S2 noted.  No murmur, rubs or gallops noted. No JVD or BLE edema. Pulmonary/Chest: Normal effort and positive vesicular breath sounds. No respiratory distress. No wheezes, rales or ronchi noted.  Abdomen: Soft and nontender over the bladder. Normal bowel sounds. Musculoskeletal: Joint enlargement noted of bilateral knees.  Wearing brace to left knee.  Gait slow and steady without device. Neurological: Alert and oriented.  Psychiatric: Mood and affect normal.  Mildly anxious appearing. Judgment and thought content normal.    BMET    Component Value Date/Time   NA 143 08/16/2023 0754   NA 140 11/18/2012 0530   K 4.2 08/16/2023 0754   K 4.0 11/18/2012 0530   CL 107 08/16/2023 0754   CL 108 (H) 11/18/2012 0530   CO2 28 08/16/2023 0754   CO2 27 11/18/2012 0530   GLUCOSE 101 08/16/2023 0754   GLUCOSE 95 11/18/2012 0530   BUN 13 08/16/2023 0754   BUN 8 11/18/2012 0530   CREATININE 1.05 08/16/2023 0754   CALCIUM  8.9 08/16/2023 0754   CALCIUM  7.9 (L) 11/18/2012 0530   GFRNONAA >60 04/24/2022 1113   GFRNONAA 60 06/16/2020 0944   GFRAA 69 06/16/2020 0944    Lipid Panel     Component Value Date/Time   CHOL 161 08/16/2023 0754   TRIG 94 08/16/2023 0754   HDL 70 08/16/2023 0754   CHOLHDL 2.3 08/16/2023 0754   VLDL  16.0 03/31/2014 0852   LDLCALC 73 08/16/2023 0754    CBC    Component Value Date/Time   WBC 7.7 08/16/2023 0754   RBC 3.85 08/16/2023 0754   HGB 12.2 08/16/2023 0754    HGB 8.3 (L) 11/18/2012 0530   HCT 37.8 08/16/2023 0754   HCT 26.1 (L) 11/16/2012 1225   PLT 338 08/16/2023 0754   PLT 260 11/16/2012 1225   MCV 98.2 08/16/2023 0754   MCV 95 11/16/2012 1225   MCH 31.7 08/16/2023 0754   MCHC 32.3 08/16/2023 0754   RDW 12.4 08/16/2023 0754   RDW 13.8 11/16/2012 1225   LYMPHSABS 3,773 06/23/2022 0755   LYMPHSABS 2.7 11/16/2012 1225   MONOABS 0.6 04/08/2017 1334   MONOABS 1.3 (H) 11/16/2012 1225   EOSABS 601 (H) 08/16/2023 0754   EOSABS 0.2 11/16/2012 1225   BASOSABS 77 08/16/2023 0754   BASOSABS 0.1 11/16/2012 1225    Hgb A1C Lab Results  Component Value Date   HGBA1C 5.3 08/16/2023            Assessment & Plan:   Assessment and Plan    Knee Osteoarthritis (pre-surgical) Scheduled for left knee replacement next week due to severe pain and dysfunction.  Anxiety Increased anxiety likely due to upcoming knee surgery. Takes Xanax  twice daily, occasionally halves tablets. Concerned about running out before surgery. Prescription refilled with 30 tablets by nurse practitioner. - Refilled Xanax  prescription with 30 tablets. -Continue buspirone , lamotrigine  and venlafaxine  at previous dose - Advised to follow up with primary doctor if more medication is needed.  Irritable Bowel Syndrome (IBS) Symptoms include alternating constipation and diarrhea, cramping likely exacerbated by anxiety. No gastrointestinal bleeding or infection. - Advised use of Imodium OTC for diarrhea as needed. - Advised use of Miralax OTC for constipation as needed. - Follow up if symptoms persist after surgery.  Urinary Hesitancy Reports urinary hesitancy with normal urinalysis, indicating no infection. - Encouraged increased water intake over tea. - Follow up if symptoms persist.   Follow-up with your PCP as previously scheduled Angeline Laura, NP

## 2024-04-07 ENCOUNTER — Other Ambulatory Visit: Payer: Self-pay | Admitting: Family Medicine

## 2024-04-07 ENCOUNTER — Ambulatory Visit: Admitting: Family Medicine

## 2024-04-07 DIAGNOSIS — F419 Anxiety disorder, unspecified: Secondary | ICD-10-CM

## 2024-04-08 ENCOUNTER — Other Ambulatory Visit
Admission: RE | Admit: 2024-04-08 | Discharge: 2024-04-08 | Disposition: A | Discharge status: Home / Self Care | Payer: Self-pay | Source: Ambulatory Visit | Attending: Medical Genetics | Admitting: Medical Genetics

## 2024-04-09 NOTE — Telephone Encounter (Signed)
 Requested medication (s) are due for refill today: yes  Requested medication (s) are on the active medication list: yes  Last refill:  11/2523  Future visit scheduled: yes  Notes to clinic:  Unable to refill per protocol, cannot delegate.      Requested Prescriptions  Pending Prescriptions Disp Refills   ALPRAZolam  (XANAX ) 0.5 MG tablet [Pharmacy Med Name: ALPRAZOLAM  0.5MG  TABLETS] 30 tablet     Sig: TAKE 1 TABLET(0.5 MG) BY MOUTH TWICE DAILY AS NEEDED FOR ANXIETY     Not Delegated - Psychiatry: Anxiolytics/Hypnotics 2 Failed - 04/09/2024 12:45 PM      Failed - This refill cannot be delegated      Failed - Urine Drug Screen completed in last 360 days      Passed - Patient is not pregnant      Passed - Valid encounter within last 6 months    Recent Outpatient Visits           1 week ago Urinary hesitancy   [facility] [place], [place] W, NP   4 months ago Acute cystitis without hematuria   [facility] [place], Marsa PARAS, DO   4 months ago Gastroesophageal reflux disease without esophagitis   [place] [place], Marsa PARAS, DO       Future Appointments             In 4 weeks Stoioff, Glendia BROCKS, MD [facility] [place]   In 4 months Edman, Marsa PARAS, DO [place] [facility]

## 2024-04-21 LAB — GENECONNECT MOLECULAR SCREEN: Genetic Analysis Overall Interpretation: NEGATIVE

## 2024-05-01 ENCOUNTER — Other Ambulatory Visit: Payer: Self-pay | Admitting: General Surgery

## 2024-05-01 ENCOUNTER — Encounter: Payer: Self-pay | Admitting: General Surgery

## 2024-05-01 ENCOUNTER — Ambulatory Visit: Admitting: General Surgery

## 2024-05-01 ENCOUNTER — Other Ambulatory Visit: Payer: Self-pay

## 2024-05-01 VITALS — BP 141/92 | HR 112 | Temp 98.3°F | Ht 62.0 in | Wt 209.2 lb

## 2024-05-01 DIAGNOSIS — N644 Mastodynia: Secondary | ICD-10-CM | POA: Diagnosis not present

## 2024-05-01 DIAGNOSIS — Z1231 Encounter for screening mammogram for malignant neoplasm of breast: Secondary | ICD-10-CM

## 2024-05-01 NOTE — Patient Instructions (Addendum)
 We will place a order for you to have a mammogram, you may call [facility] to get that scheduled [phone]  Once you get your mammogram appointment, call our office to schedule your appointment with Dr Marinda   Breast Tenderness Breast tenderness is a common problem for women of all ages, but may also occur in men. Breast tenderness has many possible causes, including hormone changes, infections, taking certain medicines, and caffeine intake. In women, the pain usually comes and goes with the menstrual cycle, but it can also be constant. Breast tenderness may range from mild discomfort to severe pain. You may have tests, such as a mammogram or an ultrasound, to check for any unusual findings. Having breast tenderness usually does not mean that you have breast cancer. Follow these instructions at home: Managing pain and discomfort  If directed, put ice on the painful area. To do this: Put ice in a plastic bag. Place a towel between your skin and the bag. Leave the ice on for 20 minutes, 2-3 times a day. If your skin turns bright red, remove the ice right away to prevent skin damage. The risk of skin damage is higher if you cannot feel pain, heat, or cold. Wear a supportive bra or chest support: During exercise. While sleeping, if your breasts are very tender. Medicines Take over-the-counter and prescription medicines only as told by your health care provider. If the cause of your pain is an infection, you may be prescribed an antibiotic medicine. If you were prescribed antibiotics, take them as told by your health care provider. Do not stop using the antibiotic even if you start to feel better. Eating and drinking Decrease the amount of caffeine in your diet. Instead, drink more water and choose caffeine-free drinks. Your health care provider may recommend that you lessen the amount of fat in your diet. You can do this by: Limiting fried foods. Cooking foods using methods such  as baking, boiling, grilling, and broiling. General instructions  Keep a log of the days and times when your breasts are most tender. Ask your health care provider how to do breast exams at home. This will help you notice if you have an unusual growth or lump. Keep all follow-up visits. Contact a health care provider if: Any part of your breast is hard, red, and hot to the touch. This may be a sign of infection. You are a woman and have a new or painful lump in your breast that remains after your menstrual period ends. You are not breastfeeding and you have fluid, especially blood or pus, coming out of your nipples. You have a fever. Your pain does not improve or it gets worse. Your pain is interfering with your daily activities. Summary Breast tenderness may range from mild discomfort to severe pain. Breast tenderness has many possible causes, including hormone changes, infections, taking certain medicines, and caffeine intake. It can be treated with ice, wearing a supportive bra or chest support, and medicines. Make changes to your diet as told by your health care provider. This information is not intended to replace advice given to you by your health care provider. Make sure you discuss any questions you have with your health care provider. Document Revised: 12/07/2021 Document Reviewed: 12/07/2021 Elsevier Patient Education  2024 [employer].

## 2024-05-02 NOTE — Progress Notes (Signed)
 Outpatient Surgical Follow Up   [name] is an 68 y.o. female.   Chief Complaint  Patient presents with   Breast Pain    HPI: [name] returns today to discuss left breast pain.  She last saw me several months ago for left breast pain and I encouraged her to use NSAIDs and Tylenol  for pain as well as potentially using primrose oil.  She reports that since she last saw me she has been doing well but still gets intermittent pain.  She describes the pain as sharp and centered around her nipple.  She says that the last time she had this sensation was 3 weeks ago and it came and went fairly quickly.  She denies any overlying skin changes to her breast.  She denies any dominant masses or lesions, she denies any dimpling of the skin or nipple retraction.  She also denies any discharge from her nipple.  This all started after September of last year when she had a mammogram that showed some concerning lesions.  She did undergo biopsy of these lesions that was benign.  Also of note, in the interim she has had a knee replacement and is doing quite well from that standpoint.  Past Medical History:  Diagnosis Date   Allergy    Anxiety    Arthritis    left hip   Blood transfusion without reported diagnosis    with hip surgery    Depression 10 years ago   GERD (gastroesophageal reflux disease)    Hepatitis 1972   hep B   Hyperlipidemia    controlled on meds    Memory disorder 09/21/2017   OSA (obstructive sleep apnea) 05/08/2018   PONV (postoperative nausea and vomiting)    Seizures (HCC)    last seizure 11-2016   Sleep apnea    no cpap    Stroke (HCC)    per pt     Past Surgical History:  Procedure Laterality Date   ABDOMINAL HYSTERECTOMY  04/1999   APPENDECTOMY     ARTHROSCOPIC REPAIR ACL     bilateral knees   BREAST BIOPSY Right 07/05/2023   stereo bx, calcs, COIL clip-path pending   BREAST BIOPSY Left 07/05/2023   stereo bx, calcs, RIBBON clip-path pending   BREAST  BIOPSY Left 07/05/2023   stereo bx, calcs, X clip-path pending   BREAST BIOPSY Right 07/05/2023   MM RT BREAST BX W LOC DEV 1ST LESION IMAGE BX SPEC STEREO GUIDE 07/05/2023 [facility]   BREAST BIOPSY Left 07/05/2023   MM LT BREAST BX W LOC DEV EA AD LESION IMG BX SPEC STEREO GUIDE 07/05/2023 [facility]   BREAST BIOPSY Left 07/05/2023   MM LT BREAST BX W LOC DEV EA AD LESION IMG BX SPEC STEREO GUIDE 07/05/2023 [facility]   BREAST EXCISIONAL BIOPSY Right 2008   NEG   BREAST EXCISIONAL BIOPSY Left YRS AGO   X2 - NEG   BREAST LUMPECTOMY  06/1979; 03/1995   left   CHOLECYSTECTOMY  09/1987   COLONOSCOPY     JOINT REPLACEMENT  December 09, 2012   LAPAROSCOPIC GASTRIC SLEEVE RESECTION  09/30/2012   Procedure: LAPAROSCOPIC GASTRIC SLEEVE RESECTION;  Surgeon: Redell Faith, DO;  Location: [facility];  Service: General;  Laterality: N/A;   POLYPECTOMY     TOTAL HIP ARTHROPLASTY  11/13/2012   left   UPPER GASTROINTESTINAL ENDOSCOPY     UPPER GI ENDOSCOPY  09/30/2012   Procedure: UPPER GI ENDOSCOPY;  Surgeon: Redell Faith, DO;  Location:  [facility];  Service: General;;   XI ROBOTIC LAPAROSCOPIC ASSISTED APPENDECTOMY N/A 04/24/2022   Procedure: XI ROBOTIC LAPAROSCOPIC ASSISTED APPENDECTOMY;  Surgeon: Rodolph Romano, MD;  Location: [facility];  Service: General;  Laterality: N/A;    Family History  Problem Relation Age of Onset   Hyperlipidemia Mother    Obesity Mother    Pulmonary fibrosis Mother    Arthritis Mother    Cancer Maternal Aunt        ovarian cancer   Cancer Father    Prostate cancer Father    Colon cancer Paternal Grandmother    Esophageal cancer Neg Hx    Stomach cancer Neg Hx    Rectal cancer Neg Hx    Breast cancer Neg Hx    Colon polyps Neg Hx     Social History:  reports that she quit smoking about 22 years ago. Her smoking use included cigarettes. She started smoking about 27 years ago. She has a 1.3 pack-year smoking history. She has been exposed to tobacco  smoke. She has never used smokeless tobacco. She reports current alcohol use. She reports that she does not use drugs.  Allergies: Not on File  Medications reviewed.    ROS Full ROS performed and is otherwise negative other than what is stated in HPI   BP (!) 141/92   Pulse (!) 112   Temp 98.3 F (36.8 C) (Oral)   Ht 5' 2 (1.575 m)   Wt 209 lb 3.2 oz (94.9 kg)   SpO2 97%   BMI 38.26 kg/m   Physical Exam Alert and oriented x 3, normal work of breathing room air, sinus tachycardia, abdomen soft nontender nondistended.  Breast exam performed in the presence of a chaperone.  Right breast there are no evident lesions or skin changes.  She has no masses or skin no nipple retraction or discharge.  On her left breast there is a area of white discoloration superior to the nipple consistent with a previous breast biopsy, no other dominant masses or skin changes.  No nipple retraction or nipple discharge.    No results found for this or any previous visit (from the past 48 hours). No results found.  Assessment/Plan: Patient with left breast pain.  She had a mammogram last year that showed some concerning findings so biopsy was done that was benign.  Since then she has intermittent mitten pain.  I discussed with her that the best treatment for this is Tylenol  and ibuprofen  as needed for the pain.  If this pain becomes more consistent and more frequent than once a every 3 weeks then we can consider hormonal therapy but she agrees that she would like to avoid that.  Given that she is due soon for her mammogram we will go ahead and order that to get it done sometime in August and we will see her after that to see how the pain is doing and to discuss her mammogram results.  1. Encounter for screening mammogram for malignant neoplasm of breast (Primary)  - [place]  BREAST COMPLETE UNI LEFT INC AXILLA; Future  2. Breast pain, left  - US  BREAST COMPLETE UNI LEFT INC AXILLA; Future    Jayson Endow, M.D. [facility]

## 2024-05-08 ENCOUNTER — Ambulatory Visit (INDEPENDENT_AMBULATORY_CARE_PROVIDER_SITE_OTHER): Admitting: Urology

## 2024-05-08 VITALS — BP 125/90 | HR 92 | Ht 62.0 in | Wt 208.5 lb

## 2024-05-08 DIAGNOSIS — R3911 Hesitancy of micturition: Secondary | ICD-10-CM

## 2024-05-08 LAB — MICROSCOPIC EXAMINATION: Epithelial Cells (non renal): >10 /HPF — AB (ref 0–10)

## 2024-05-08 LAB — URINALYSIS, COMPLETE
Bilirubin, UA: NEGATIVE
Glucose, UA: NEGATIVE
Leukocytes,UA: NEGATIVE
Nitrite, UA: NEGATIVE
RBC, UA: NEGATIVE
Specific Gravity, UA: 1.03 (ref 1.005–1.030)
Urobilinogen, Ur: 0.2 mg/dL (ref 0.2–1.0)
pH, UA: 6 (ref 5.0–7.5)

## 2024-05-08 NOTE — Progress Notes (Signed)
 Patient left without being seen and indicates she would reschedule

## 2024-05-10 ENCOUNTER — Encounter (INDEPENDENT_AMBULATORY_CARE_PROVIDER_SITE_OTHER): Payer: Self-pay | Admitting: Family Medicine

## 2024-05-10 DIAGNOSIS — N3000 Acute cystitis without hematuria: Secondary | ICD-10-CM

## 2024-05-12 MED ORDER — SULFAMETHOXAZOLE-TRIMETHOPRIM 800-160 MG PO TABS: 1.0000 | ORAL_TABLET | Freq: Two times a day (BID) | ORAL | 0 refills | Status: AC

## 2024-05-12 NOTE — Telephone Encounter (Signed)
Please see the MyChart message reply(ies) for my assessment and plan.    This patient gave consent for this Medical Advice Message and is aware that it may result in a bill to their insurance company, as well as the possibility of receiving a bill for a co-payment or deductible. They are an established patient, but are not seeking medical advice exclusively about a problem treated during an in person or video visit in the last seven days. I did not recommend an in person or video visit within seven days of my reply.    I spent a total of 5 minutes cumulative time within 7 days through MyChart messaging.  Axelle Szwed, DO   

## 2024-05-13 ENCOUNTER — Encounter: Payer: Self-pay | Admitting: Physician Assistant

## 2024-05-13 ENCOUNTER — Ambulatory Visit: Admitting: Physician Assistant

## 2024-05-13 DIAGNOSIS — Z1283 Encounter for screening for malignant neoplasm of skin: Secondary | ICD-10-CM | POA: Diagnosis not present

## 2024-05-13 DIAGNOSIS — D1801 Hemangioma of skin and subcutaneous tissue: Secondary | ICD-10-CM | POA: Diagnosis not present

## 2024-05-13 DIAGNOSIS — L578 Other skin changes due to chronic exposure to nonionizing radiation: Secondary | ICD-10-CM

## 2024-05-13 DIAGNOSIS — D229 Melanocytic nevi, unspecified: Secondary | ICD-10-CM

## 2024-05-13 DIAGNOSIS — L570 Actinic keratosis: Secondary | ICD-10-CM | POA: Diagnosis not present

## 2024-05-13 DIAGNOSIS — W908XXA Exposure to other nonionizing radiation, initial encounter: Secondary | ICD-10-CM | POA: Diagnosis not present

## 2024-05-13 DIAGNOSIS — L814 Other melanin hyperpigmentation: Secondary | ICD-10-CM

## 2024-05-13 DIAGNOSIS — L821 Other seborrheic keratosis: Secondary | ICD-10-CM | POA: Diagnosis not present

## 2024-05-13 NOTE — Progress Notes (Signed)
   New Patient Visit   Subjective  [name] is a 68 y.o. female who presents for the following: Skin Cancer Screening and Full Body Skin Exam  [name] is here today for a FBSE. There are a couple places on her face she would like to be evaluated today. There is no personal hx of skin cancer or family hx.   Previous patient at [facility] in [place]. No history of skin cancer.   The patient presents for Total-Body Skin Exam (TBSE) for skin cancer screening and mole check. The patient has spots, moles and lesions to be evaluated, some may be new or changing and the patient may have concern these could be cancer.    The following portions of the chart were reviewed this encounter and updated as appropriate: medications, allergies, medical history  Review of Systems:  No other skin or systemic complaints except as noted in HPI or Assessment and Plan.  Objective  Well appearing patient in no apparent distress; mood and affect are within normal limits.  A full examination was performed including scalp, head, eyes, ears, nose, lips, neck, chest, axillae, abdomen, back, buttocks, bilateral upper extremities, bilateral lower extremities, hands, feet, fingers, toes, fingernails, and toenails. All findings within normal limits unless otherwise noted below.   Relevant physical exam findings are noted in the Assessment and Plan.  Right Temple Erythematous thin papules/macules with gritty scale.   Assessment & Plan   SKIN CANCER SCREENING PERFORMED TODAY.  ACTINIC DAMAGE - Chronic condition, secondary to cumulative UV/sun exposure - diffuse scaly erythematous macules with underlying dyspigmentation - Recommend daily broad spectrum sunscreen SPF 30+ to sun-exposed areas, reapply every 2 hours as needed.  - Staying in the shade or wearing long sleeves, sun glasses (UVA+UVB protection) and wide brim hats (4-inch brim around the entire circumference of the hat) are also  recommended for sun protection.  - Call for new or changing lesions.  LENTIGINES, SEBORRHEIC KERATOSES, HEMANGIOMAS - Benign normal skin lesions - Benign-appearing - Call for any changes  MELANOCYTIC NEVI - Tan-brown and/or pink-flesh-colored symmetric macules and papules - Benign appearing on exam today - Observation - Call clinic for new or changing moles - Recommend daily use of broad spectrum spf 30+ sunscreen to sun-exposed areas.        AK (ACTINIC KERATOSIS) Right Temple Destruction of lesion - Right Temple Complexity: simple   Destruction method: cryotherapy   Informed consent: discussed and consent obtained   Timeout:  patient name, date of birth, surgical site, and procedure verified Lesion destroyed using liquid nitrogen: Yes   Region frozen until ice ball extended beyond lesion: Yes   Outcome: patient tolerated procedure well with no complications   Post-procedure details: wound care instructions given    SCREENING EXAM FOR SKIN CANCER   ACTINIC SKIN DAMAGE   LENTIGINES   SEBORRHEIC KERATOSIS   MULTIPLE BENIGN NEVI   CHERRY ANGIOMA    Return in about 1 year (around 05/13/2025) for fbse.  I, Gordan Beams, CMA, am acting as scribe for Jemya Depierro K, PA-C.   Documentation: I have reviewed the above documentation for accuracy and completeness, and I agree with the above.  Flynt Breeze K, PA-C

## 2024-05-16 DIAGNOSIS — M25562 Pain in left knee: Secondary | ICD-10-CM | POA: Diagnosis not present

## 2024-05-16 DIAGNOSIS — M25662 Stiffness of left knee, not elsewhere classified: Secondary | ICD-10-CM | POA: Diagnosis not present

## 2024-05-19 DIAGNOSIS — M1712 Unilateral primary osteoarthritis, left knee: Secondary | ICD-10-CM | POA: Diagnosis not present

## 2024-05-19 DIAGNOSIS — Z96651 Presence of right artificial knee joint: Secondary | ICD-10-CM | POA: Diagnosis not present

## 2024-05-25 ENCOUNTER — Other Ambulatory Visit: Payer: Self-pay | Admitting: Family Medicine

## 2024-05-25 DIAGNOSIS — K219 Gastro-esophageal reflux disease without esophagitis: Secondary | ICD-10-CM

## 2024-05-25 DIAGNOSIS — R112 Nausea with vomiting, unspecified: Secondary | ICD-10-CM

## 2024-05-27 NOTE — Telephone Encounter (Signed)
 Requested Prescriptions  Pending Prescriptions Disp Refills   pantoprazole  (PROTONIX ) 40 MG tablet [Pharmacy Med Name: PANTOPRAZOLE  40MG  TABLETS] 30 tablet 2    Sig: TAKE 1 TABLET(40 MG) BY MOUTH DAILY BEFORE BREAKFAST     Gastroenterology: Proton Pump Inhibitors Passed - 05/27/2024  3:34 PM      Passed - Valid encounter within last 12 months    Recent Outpatient Visits           1 month ago Urinary hesitancy   [place] [facility] [place], [place] W, NP   5 months ago Acute cystitis without hematuria   [place] [facility] [place], Marsa PARAS, DO   5 months ago Gastroesophageal reflux disease without esophagitis   [place] [facility] [place], Marsa PARAS, DO       Future Appointments             In 3 months [place], Marsa PARAS, DO [place] [facility], [facility]

## 2024-06-24 DIAGNOSIS — M25662 Stiffness of left knee, not elsewhere classified: Secondary | ICD-10-CM | POA: Diagnosis not present

## 2024-06-24 DIAGNOSIS — M25562 Pain in left knee: Secondary | ICD-10-CM | POA: Diagnosis not present

## 2024-07-01 ENCOUNTER — Ambulatory Visit
Admission: RE | Admit: 2024-07-01 | Discharge: 2024-07-01 | Disposition: A | Discharge status: Home / Self Care | Source: Ambulatory Visit | Attending: General Surgery | Admitting: General Surgery

## 2024-07-01 DIAGNOSIS — Z1231 Encounter for screening mammogram for malignant neoplasm of breast: Secondary | ICD-10-CM | POA: Diagnosis not present

## 2024-07-15 DIAGNOSIS — G8918 Other acute postprocedural pain: Secondary | ICD-10-CM | POA: Diagnosis not present

## 2024-07-15 DIAGNOSIS — M1712 Unilateral primary osteoarthritis, left knee: Secondary | ICD-10-CM | POA: Diagnosis not present

## 2024-07-15 DIAGNOSIS — Z96651 Presence of right artificial knee joint: Secondary | ICD-10-CM | POA: Diagnosis not present

## 2024-07-28 DIAGNOSIS — Z96652 Presence of left artificial knee joint: Secondary | ICD-10-CM | POA: Diagnosis not present

## 2024-08-19 ENCOUNTER — Other Ambulatory Visit

## 2024-08-19 ENCOUNTER — Other Ambulatory Visit: Payer: Self-pay

## 2024-08-19 DIAGNOSIS — E559 Vitamin D deficiency, unspecified: Secondary | ICD-10-CM

## 2024-08-19 DIAGNOSIS — R7309 Other abnormal glucose: Secondary | ICD-10-CM | POA: Diagnosis not present

## 2024-08-19 DIAGNOSIS — E538 Deficiency of other specified B group vitamins: Secondary | ICD-10-CM | POA: Diagnosis not present

## 2024-08-19 DIAGNOSIS — Z0000 Encounter for general adult medical examination without abnormal findings: Secondary | ICD-10-CM

## 2024-08-19 DIAGNOSIS — E785 Hyperlipidemia, unspecified: Secondary | ICD-10-CM | POA: Diagnosis not present

## 2024-08-19 DIAGNOSIS — I10 Essential (primary) hypertension: Secondary | ICD-10-CM | POA: Diagnosis not present

## 2024-08-19 DIAGNOSIS — Z8673 Personal history of transient ischemic attack (TIA), and cerebral infarction without residual deficits: Secondary | ICD-10-CM | POA: Diagnosis not present

## 2024-08-19 DIAGNOSIS — E7800 Pure hypercholesterolemia, unspecified: Secondary | ICD-10-CM

## 2024-08-20 ENCOUNTER — Other Ambulatory Visit: Payer: Self-pay

## 2024-08-20 LAB — VITAMIN B12: Vitamin B-12: 681 pg/mL (ref 200–1100)

## 2024-08-20 LAB — CBC WITH DIFFERENTIAL/PLATELET
Absolute Lymphocytes: 3128 {cells}/uL (ref 850–3900)
Absolute Monocytes: 585 {cells}/uL (ref 200–950)
Basophils Absolute: 71 {cells}/uL (ref 0–200)
Basophils Relative: 0.9 %
Eosinophils Absolute: 253 {cells}/uL (ref 15–500)
Eosinophils Relative: 3.2 %
HCT: 41.3 % (ref 35.0–45.0)
Hemoglobin: 13.4 g/dL (ref 11.7–15.5)
MCH: 32.2 pg (ref 27.0–33.0)
MCHC: 32.4 g/dL (ref 32.0–36.0)
MCV: 99.3 fL (ref 80.0–100.0)
MPV: 9.4 fL (ref 7.5–12.5)
Monocytes Relative: 7.4 %
Neutro Abs: 3863 {cells}/uL (ref 1500–7800)
Neutrophils Relative %: 48.9 %
Platelets: 309 Thousand/uL (ref 140–400)
RBC: 4.16 Million/uL (ref 3.80–5.10)
RDW: 12.7 % (ref 11.0–15.0)
Total Lymphocyte: 39.6 %
WBC: 7.9 Thousand/uL (ref 3.8–10.8)

## 2024-08-20 LAB — COMPREHENSIVE METABOLIC PANEL WITH GFR
AG Ratio: 2 (calc) (ref 1.0–2.5)
ALT: 7 U/L (ref 6–29)
AST: 12 U/L (ref 10–35)
Albumin: 3.8 g/dL (ref 3.6–5.1)
Alkaline phosphatase (APISO): 64 U/L (ref 37–153)
BUN: 12 mg/dL (ref 7–25)
CO2: 29 mmol/L (ref 20–32)
Calcium: 9.2 mg/dL (ref 8.6–10.4)
Chloride: 106 mmol/L (ref 98–110)
Creat: 1.03 mg/dL (ref 0.50–1.05)
Globulin: 1.9 g/dL (ref 1.9–3.7)
Glucose, Bld: 88 mg/dL (ref 65–99)
Potassium: 4.8 mmol/L (ref 3.5–5.3)
Sodium: 142 mmol/L (ref 135–146)
Total Bilirubin: 0.3 mg/dL (ref 0.2–1.2)
Total Protein: 5.7 g/dL — ABNORMAL LOW (ref 6.1–8.1)
eGFR: 60 mL/min/1.73m2 (ref >=60)

## 2024-08-20 LAB — TSH: TSH: 2.07 m[IU]/L (ref 0.40–4.50)

## 2024-08-20 LAB — LIPID PANEL
Cholesterol: 115 mg/dL (ref <200)
HDL: 57 mg/dL (ref >=50)
LDL Cholesterol (Calc): 40 mg/dL
Non-HDL Cholesterol (Calc): 58 mg/dL (ref <130)
Total CHOL/HDL Ratio: 2 (calc) (ref <5.0)
Triglycerides: 101 mg/dL (ref <150)

## 2024-08-20 LAB — HEMOGLOBIN A1C
Hgb A1c MFr Bld: 5 % (ref <5.7)
Mean Plasma Glucose: 97 mg/dL
eAG (mmol/L): 5.4 mmol/L

## 2024-08-20 LAB — VITAMIN D 25 HYDROXY (VIT D DEFICIENCY, FRACTURES): Vit D, 25-Hydroxy: 69 ng/mL (ref 30–100)

## 2024-08-27 ENCOUNTER — Ambulatory Visit: Payer: Self-pay | Admitting: Family Medicine

## 2024-08-27 ENCOUNTER — Encounter: Payer: Self-pay | Admitting: Family Medicine

## 2024-08-27 VITALS — BP 124/80 | HR 101 | Ht 62.0 in | Wt 189.5 lb

## 2024-08-27 DIAGNOSIS — F419 Anxiety disorder, unspecified: Secondary | ICD-10-CM | POA: Diagnosis not present

## 2024-08-27 DIAGNOSIS — R112 Nausea with vomiting, unspecified: Secondary | ICD-10-CM | POA: Diagnosis not present

## 2024-08-27 DIAGNOSIS — F3341 Major depressive disorder, recurrent, in partial remission: Secondary | ICD-10-CM | POA: Diagnosis not present

## 2024-08-27 DIAGNOSIS — Z0000 Encounter for general adult medical examination without abnormal findings: Secondary | ICD-10-CM

## 2024-08-27 DIAGNOSIS — N952 Postmenopausal atrophic vaginitis: Secondary | ICD-10-CM | POA: Diagnosis not present

## 2024-08-27 DIAGNOSIS — I6930 Unspecified sequelae of cerebral infarction: Secondary | ICD-10-CM

## 2024-08-27 DIAGNOSIS — K219 Gastro-esophageal reflux disease without esophagitis: Secondary | ICD-10-CM

## 2024-08-27 DIAGNOSIS — E7800 Pure hypercholesterolemia, unspecified: Secondary | ICD-10-CM | POA: Diagnosis not present

## 2024-08-27 DIAGNOSIS — K582 Mixed irritable bowel syndrome: Secondary | ICD-10-CM | POA: Insufficient documentation

## 2024-08-27 MED ORDER — ONDANSETRON 4 MG PO TBDP: 4.0000 mg | ORAL_TABLET | Freq: Three times a day (TID) | ORAL | 0 refills | Status: DC | PRN

## 2024-08-27 MED ORDER — ALPRAZOLAM 0.5 MG PO TABS: ORAL_TABLET | ORAL | 2 refills | Status: AC

## 2024-08-27 MED ORDER — ROSUVASTATIN CALCIUM 10 MG PO TABS: 10.0000 mg | ORAL_TABLET | Freq: Every day | ORAL | 3 refills | Status: AC

## 2024-08-27 MED ORDER — ESTRADIOL 0.01 % VA CREA: 1.0000 | TOPICAL_CREAM | VAGINAL | 3 refills | Status: AC

## 2024-08-27 MED ORDER — VENLAFAXINE HCL ER 150 MG PO CP24: 150.0000 mg | ORAL_CAPSULE | Freq: Every day | ORAL | 3 refills | Status: DC

## 2024-08-27 MED ORDER — PANTOPRAZOLE SODIUM 40 MG PO TBEC: 40.0000 mg | DELAYED_RELEASE_TABLET | Freq: Every day | ORAL | 3 refills | Status: DC

## 2024-08-27 MED ORDER — BUSPIRONE HCL 5 MG PO TABS
5.0000 mg | ORAL_TABLET | Freq: Three times a day (TID) | ORAL | 3 refills | Status: AC | PRN
Start: 2024-08-27 — End: ?

## 2024-08-27 MED ORDER — DICYCLOMINE HCL 10 MG PO CAPS: 10.0000 mg | ORAL_CAPSULE | Freq: Three times a day (TID) | ORAL | 0 refills | Status: DC

## 2024-08-27 NOTE — Patient Instructions (Addendum)
 Thank you for coming to the office today.   All medications refilled  IBS symptoms  IBGard OTC Peppermint Oil (Triple Coated Capsule) 180mg  take one 3 times daily to reduce diarrhea  Caution with artificial sweeteners, mints hard candy gum etc that can trigger  Zofran  as needed   Dicyclomine for abdominal urge and cramping.  Future referral if needed to GI   You have been referred for a Coronary Calcium  Score Cardiac CT Scan. This is a screening test for patients aged 96-50+ with cardiovascular risk factors or who are healthy but would be interested in Cardiovascular Screening for heart disease. Even if there is a family history of heart disease, this imaging can be useful. Typically it can be done every 5+ years or at a different timeline we agree on  The scan will look at the chest and mainly focus on the heart and identify early signs of calcium  build up or blockages within the heart arteries. It is not 100% accurate for identifying blockages or heart disease, but it is useful to help us  predict who may have some early changes or be at risk in the future for a heart attack or cardiovascular problem.  The results are reviewed by a Cardiologist and they will document the results. It should become available on MyChart. Typically the results are divided into percentiles based on other patients of the same demographic and age. So it will compare your risk to others similar to you. If you have a higher score >99 or higher percentile >75%tile, it is recommended to consider Statin cholesterol therapy and or referral to Cardiologist. I will try to help explain your results and if we have questions we can contact the Cardiologist.  You will be contacted for scheduling. Usually it is done at any imaging facility through [facility], [facility] or [facility].  The cost is $99 flat fee total and it does not go through insurance, so no authorization is  required.   DUE for FASTING BLOOD WORK (no food or drink after midnight before the lab appointment, only water or coffee without cream/sugar on the morning of)  SCHEDULE Lab Only visit in the morning at the clinic for lab draw in 1 YEAR  - Make sure Lab Only appointment is at about 1 week before your next appointment, so that results will be available  For Lab Results, once available within 2-3 days of blood draw, you can can log in to MyChart online to view your results and a brief explanation. Also, we can discuss results at next follow-up visit.   Please schedule a Follow-up Appointment to: Return for 1 year fasting lab > 1 week later Annual Physical.  If you have any other questions or concerns, please feel free to call the office or send a message through MyChart. You may also schedule an earlier appointment if necessary.  Additionally, you may be receiving a survey about your experience at our office within a few days to 1 week by e-mail or mail. We value your feedback.  Marsa Officer, DO [facility], [place]

## 2024-08-27 NOTE — Progress Notes (Unsigned)
 Subjective:    Patient ID: [name], female    DOB: [birth year], 68 y.o.   MRN: [id number]  [name] is a 68 y.o. female presenting on 08/27/2024 for Annual Exam   HPI  Discussed the use of AI scribe software for clinical note transcription with the patient, who gave verbal consent to proceed.  History of Present Illness   ***Stressors affecting  Digestive *** *** nausea vomiting and diarrhea Onset 6 months ***  ***Symptoms of dizziness ***tremor in hand hand shaking  ***GERD Dietary triggered      HYPERLIPIDEMIA: - Reports no concerns. Last lipid panel 08/2023, controlled LDL 73 - Currently taking Rosuvastatin  10mg , tolerating well without side effects or myalgias   Lab review today. All results within normal range, except mild total protein and eos  No significant concerns. Similar to last year, nearly normal but mild abnormalities on the labs. She is doing well with lifestyle diet Weight is down 4-5 lbs in past 7+ months Hemoglobin A1c 5.3, slight increase but still in normal range.   Anxiety Major Depression partial remission, recurrent Chronic problem with depression and anxiety. Currently doing well. She has Buspar  5mg  THREE TIMES A DAY AS NEEDED, taking it often for twice a day dosing but not usually THREE TIMES A DAY. Also continues on Venlafaxine  XR 150mg  daily,    History of CVA with residual deficit - memory loss and history of seizure No further complications or issues. On Aspirin 81, Lamictal  for seizures.   Update   Right Knee, TKR Surgery - Followed by [facility] Dr Leora, surgery for R- TKR was completed this Summer 2024. She has continued with Physical Therapy, continues to improve. Not at goal yet   Vaginal Itching She has had chronic problem for several months. She tried cotton underwear, tried creams, powders. No relief Admits some discharge and odor and mostly itching. Interested in swab test today. She  has been treated for yeast vaginitis before     Health Maintenance:   No prior DEXA imaging. Interested in this now.   UTD Flu Shot, PNA , Shingrix  vaccine - We are requesting copy of previous Pneumonia vaccines from [facility] 06/2023   Colonoscopy 03/2021, next due 7 years, or 2029   Mammogram 07/01/24 negative. Prior history of previous diagnostic and biopsy with fibroadenoma w calcifications. Negative for malignancy.      03/31/2024   10:08 AM 01/04/2024    4:02 PM 12/10/2023    2:15 PM  Depression screen PHQ 2/9  Decreased Interest 2 0 1  Down, Depressed, Hopeless 2 1 2   PHQ - 2 Score 4 1 3   Altered sleeping 2 0 1  Tired, decreased energy  1 1  Change in appetite  0 2  Feeling bad or failure about yourself   0 1  Trouble concentrating  0 1  Moving slowly or fidgety/restless  0 0  Suicidal thoughts  0 1  PHQ-9 Score 6  2  10    Difficult doing work/chores  Not difficult at all Somewhat difficult     Data saved with a previous flowsheet row definition       03/31/2024   10:08 AM 12/10/2023    2:15 PM 10/23/2023    3:38 PM 08/21/2023    1:19 PM  GAD 7 : Generalized Anxiety Score  Nervous, Anxious, on Edge  2 2 2   Control/stop worrying  1 3 1   Worry too much - different things  1 3 2  Trouble relaxing  1 3 1   Restless  0 2 0  Easily annoyed or irritable  1 2 1   Afraid - awful might happen  1 3 1   Total GAD 7 Score  7 18 8   Anxiety Difficulty Not difficult at all Somewhat difficult Very difficult      Past Medical History:  Diagnosis Date   Allergy    Anxiety    Arthritis    left hip   Blood transfusion without reported diagnosis    with hip surgery    Depression 10 years ago   GERD (gastroesophageal reflux disease)    Hepatitis 1972   hep B   Hyperlipidemia    controlled on meds    Memory disorder 09/21/2017   OSA (obstructive sleep apnea) 05/08/2018   PONV (postoperative nausea and vomiting)    Seizures (HCC)    last seizure 11-2016   Sleep apnea     no cpap    Stroke (HCC)    per pt    Past Surgical History:  Procedure Laterality Date   ABDOMINAL HYSTERECTOMY  04/1999   APPENDECTOMY     ARTHROSCOPIC REPAIR ACL     bilateral knees   BREAST BIOPSY Right 07/05/2023   stereo bx, calcs, COIL clip-path pending   BREAST BIOPSY Left 07/05/2023   stereo bx, calcs, RIBBON clip-path pending   BREAST BIOPSY Left 07/05/2023   stereo bx, calcs, X clip-path pending   BREAST BIOPSY Right 07/05/2023   MM RT BREAST BX W LOC DEV 1ST LESION IMAGE BX SPEC STEREO GUIDE 07/05/2023 [facility]   BREAST BIOPSY Left 07/05/2023   MM LT BREAST BX W LOC DEV EA AD LESION IMG BX SPEC STEREO GUIDE 07/05/2023 [facility]   BREAST BIOPSY Left 07/05/2023   MM LT BREAST BX W LOC DEV EA AD LESION IMG BX SPEC STEREO GUIDE 07/05/2023 [facility]   BREAST EXCISIONAL BIOPSY Right 2008   NEG   BREAST EXCISIONAL BIOPSY Left YRS AGO   X2 - NEG   BREAST LUMPECTOMY  06/1979; 03/1995   left   CHOLECYSTECTOMY  09/1987   COLONOSCOPY     JOINT REPLACEMENT  December 09, 2012   LAPAROSCOPIC GASTRIC SLEEVE RESECTION  09/30/2012   Procedure: LAPAROSCOPIC GASTRIC SLEEVE RESECTION;  Surgeon: Redell Faith, DO;  Location: [facility];  Service: General;  Laterality: N/A;   POLYPECTOMY     TOTAL HIP ARTHROPLASTY  11/13/2012   left   UPPER GASTROINTESTINAL ENDOSCOPY     UPPER GI ENDOSCOPY  09/30/2012   Procedure: UPPER GI ENDOSCOPY;  Surgeon: Redell Faith, DO;  Location: [facility];  Service: General;;   XI ROBOTIC LAPAROSCOPIC ASSISTED APPENDECTOMY N/A 04/24/2022   Procedure: XI ROBOTIC LAPAROSCOPIC ASSISTED APPENDECTOMY;  Surgeon: Rodolph Romano, MD;  Location: [facility];  Service: General;  Laterality: N/A;   Social History   Socioeconomic History   Marital status: Married    Spouse name: Not on file   Number of children: 0   Years of education: BS   Highest education level: Not on file  Occupational History   Not on file  Tobacco Use   Smoking status: Former     Current packs/day: 0.00    Average packs/day: 0.3 packs/day for 5.0 years (1.3 ttl pk-yrs)    Types: Cigarettes    Start date: 07/25/1996    Quit date: 07/25/2001    Years since quitting: 23.1    Passive exposure: Past   Smokeless tobacco: Never  Vaping Use   Vaping status:  Never Used  Substance and Sexual Activity   Alcohol use: Yes    Alcohol/week: 0.0 standard drinks of alcohol    Comment: 2 glasses of wine per week   Drug use: No   Sexual activity: Not on file  Other Topics Concern   Not on file  Social History Narrative   Lives with spouse   Caffeine use: Drinks coffee daily   Right handed   Social Drivers of Health   Financial Resource Strain: Low Risk  (01/04/2024)   Overall Financial Resource Strain (CARDIA)    Difficulty of Paying Living Expenses: Not hard at all  Food Insecurity: No Food Insecurity (01/04/2024)   Hunger Vital Sign    Worried About Running Out of Food in the Last Year: Never true    Ran Out of Food in the Last Year: Never true  Transportation Needs: No Transportation Needs (01/04/2024)   PRAPARE - Administrator, Civil Service (Medical): No    Lack of Transportation (Non-Medical): No  Physical Activity: Insufficiently Active (01/04/2024)   Exercise Vital Sign    Days of Exercise per Week: 4 days    Minutes of Exercise per Session: 30 min  Stress: Stress Concern Present (01/04/2024)   [employer] of Occupational Health - Occupational Stress Questionnaire    Feeling of Stress : Rather much  Social Connections: Moderately Integrated (01/04/2024)   Social Connection and Isolation Panel    Frequency of Communication with Friends and Family: More than three times a week    Frequency of Social Gatherings with Friends and Family: Never    Attends Religious Services: More than 4 times per year    Active Member of [employer] or Organizations: No    Attends Banker Meetings: Never    Marital Status: Married  Catering Manager  Violence: Not At Risk (01/04/2024)   Humiliation, Afraid, Rape, and Kick questionnaire    Fear of Current or Ex-Partner: No    Emotionally Abused: No    Physically Abused: No    Sexually Abused: No   Family History  Problem Relation Age of Onset   Hyperlipidemia Mother    Obesity Mother    Pulmonary fibrosis Mother    Arthritis Mother    Cancer Maternal Aunt        ovarian cancer   Cancer Father    Prostate cancer Father    Colon cancer Paternal Grandmother    Esophageal cancer Neg Hx    Stomach cancer Neg Hx    Rectal cancer Neg Hx    Breast cancer Neg Hx    Colon polyps Neg Hx    Current Outpatient Medications on File Prior to Visit  Medication Sig   ALPRAZolam  (XANAX ) 0.5 MG tablet TAKE 1 TABLET(0.5 MG) BY MOUTH TWICE DAILY AS NEEDED FOR ANXIETY   B Complex Vitamins (VITAMIN-B COMPLEX PO) Take by mouth daily.   busPIRone  (BUSPAR ) 5 MG tablet Take 1 tablet (5 mg total) by mouth 3 (three) times daily as needed (anxiety).   estradiol  (ESTRACE ) 0.1 MG/GM vaginal cream Apply 1 gram per vagina every night for 2 weeks, then apply three times a week   HYDROcodone -acetaminophen  (NORCO/VICODIN) 5-325 MG tablet Take 1 tablet every 6 hours by oral route as needed for 5 days.   hydrOXYzine  (ATARAX ) 25 MG tablet TAKE 1 TABLET BY MOUTH EVERY 6 HOURS AS NEEDED FOR ITCHING.   ibuprofen  (ADVIL ) 600 MG tablet Take 1 tablet (600 mg total) by mouth every 6 (six) hours  as needed.   lamoTRIgine  (LAMICTAL ) 100 MG tablet TAKE 1 TABLET TWICE DAILY   Multiple Vitamin (MULTIVITAMIN WITH MINERALS) TABS Take 1 tablet by mouth daily.    Omega-3 Fatty Acids (FISH OIL) 1200 MG CAPS Take 1 capsule by mouth daily.   pantoprazole  (PROTONIX ) 40 MG tablet TAKE 1 TABLET(40 MG) BY MOUTH DAILY BEFORE BREAKFAST   triamcinolone  ointment (KENALOG ) 0.1 % APPLY TO AFFECTED AREA TWICE A DAY   venlafaxine  XR (EFFEXOR -XR) 150 MG 24 hr capsule Take 1 capsule (150 mg total) by mouth daily at 6 (six) AM.   aspirin EC 81 MG  tablet Take 81 mg by mouth daily. Swallow whole. (Patient not taking: Reported on 08/27/2024)   rosuvastatin  (CRESTOR ) 10 MG tablet Take 1 tablet (10 mg total) by mouth at bedtime. (Patient not taking: Reported on 08/27/2024)   sucralfate  (CARAFATE ) 1 g tablet Take 1 tablet (1 g total) by mouth 4 (four) times daily -  with meals and at bedtime. As needed for acid reflux nausea (Patient not taking: Reported on 08/27/2024)   traMADol  (ULTRAM ) 50 MG tablet Take 50 mg by mouth every 6 (six) hours as needed. (Patient not taking: Reported on 08/27/2024)   No current facility-administered medications on file prior to visit.    Review of Systems Per HPI unless specifically indicated above     Objective:    BP 124/80 (BP Location: Left Arm, Patient Position: Sitting, Cuff Size: Normal)   Pulse (!) 101   Ht 5' 2 (1.575 m)   Wt 189 lb 8 oz (86 kg)   SpO2 95%   BMI 34.66 kg/m   Wt Readings from Last 3 Encounters:  08/27/24 189 lb 8 oz (86 kg)  05/08/24 208 lb 8 oz (94.6 kg)  05/01/24 209 lb 3.2 oz (94.9 kg)    Physical Exam  Results for orders placed or performed in visit on 08/19/24  TSH   Collection Time: 08/19/24  9:07 AM  Result Value Ref Range   TSH 2.07 0.40 - 4.50 mIU/L  Lipid panel   Collection Time: 08/19/24  9:07 AM  Result Value Ref Range   Cholesterol 115 <200 mg/dL   HDL 57 > OR = 50 mg/dL   Triglycerides 898 <849 mg/dL   LDL Cholesterol (Calc) 40 mg/dL (calc)   Total CHOL/HDL Ratio 2.0 <5.0 (calc)   Non-HDL Cholesterol (Calc) 58 <869 mg/dL (calc)  Hemoglobin J8r   Collection Time: 08/19/24  9:07 AM  Result Value Ref Range   Hgb A1c MFr Bld 5.0 <5.7 %   Mean Plasma Glucose 97 mg/dL   eAG (mmol/L) 5.4 mmol/L  CBC with Differential/Platelet   Collection Time: 08/19/24  9:07 AM  Result Value Ref Range   WBC 7.9 3.8 - 10.8 Thousand/uL   RBC 4.16 3.80 - 5.10 Million/uL   Hemoglobin 13.4 11.7 - 15.5 g/dL   HCT 58.6 64.9 - 54.9 %   MCV 99.3 80.0 - 100.0 fL   MCH  32.2 27.0 - 33.0 pg   MCHC 32.4 32.0 - 36.0 g/dL   RDW 87.2 88.9 - 84.9 %   Platelets 309 140 - 400 Thousand/uL   MPV 9.4 7.5 - 12.5 fL   Neutro Abs 3,863 1,500 - 7,800 cells/uL   Absolute Lymphocytes 3,128 850 - 3,900 cells/uL   Absolute Monocytes 585 200 - 950 cells/uL   Eosinophils Absolute 253 15 - 500 cells/uL   Basophils Absolute 71 0 - 200 cells/uL   Neutrophils Relative % 48.9 %  Total Lymphocyte 39.6 %   Monocytes Relative 7.4 %   Eosinophils Relative 3.2 %   Basophils Relative 0.9 %  Comprehensive metabolic panel with GFR   Collection Time: 08/19/24  9:07 AM  Result Value Ref Range   Glucose, Bld 88 65 - 99 mg/dL   BUN 12 7 - 25 mg/dL   Creat 8.96 9.49 - 8.94 mg/dL   eGFR 60 > OR = 60 fO/fpw/8.26f7   BUN/Creatinine Ratio SEE NOTE: 6 - 22 (calc)   Sodium 142 135 - 146 mmol/L   Potassium 4.8 3.5 - 5.3 mmol/L   Chloride 106 98 - 110 mmol/L   CO2 29 20 - 32 mmol/L   Calcium  9.2 8.6 - 10.4 mg/dL   Total Protein 5.7 (L) 6.1 - 8.1 g/dL   Albumin 3.8 3.6 - 5.1 g/dL   Globulin 1.9 1.9 - 3.7 g/dL (calc)   AG Ratio 2.0 1.0 - 2.5 (calc)   Total Bilirubin 0.3 0.2 - 1.2 mg/dL   Alkaline phosphatase (APISO) 64 37 - 153 U/L   AST 12 10 - 35 U/L   ALT 7 6 - 29 U/L  VITAMIN D  25 Hydroxy (Vit-D Deficiency, Fractures)   Collection Time: 08/19/24  9:07 AM  Result Value Ref Range   Vit D, 25-Hydroxy 69 30 - 100 ng/mL  Vitamin B12   Collection Time: 08/19/24  9:07 AM  Result Value Ref Range   Vitamin B-12 681 200 - 1,100 pg/mL      Assessment & Plan:   Problem List Items Addressed This Visit   None    Updated Health Maintenance information ***- Reviewed recent lab results with patient Encouraged improvement to lifestyle with diet and exercise -*** Goal of weight loss  Assessment and Plan Assessment & Plan   *** question   No orders of the defined types were placed in this encounter.   No orders of the defined types were placed in this encounter.    Follow  up plan: No follow-ups on file.  Marsa Officer, DO [facility] 08/27/2024, 1:32 PM

## 2024-08-28 ENCOUNTER — Other Ambulatory Visit: Payer: Self-pay | Admitting: Family Medicine

## 2024-08-28 DIAGNOSIS — F419 Anxiety disorder, unspecified: Secondary | ICD-10-CM

## 2024-08-28 DIAGNOSIS — E7800 Pure hypercholesterolemia, unspecified: Secondary | ICD-10-CM

## 2024-08-30 NOTE — Telephone Encounter (Signed)
 Already refilled on 08/27/24 in a separate refill encounter. Will refuse this request due to this.  Requested Prescriptions  Pending Prescriptions Disp Refills   rosuvastatin  (CRESTOR ) 10 MG tablet [Pharmacy Med Name: ROSUVASTATIN  CALCIUM  10 MG Oral Tablet] 90 tablet 3    Sig: TAKE 1 TABLET AT BEDTIME     Cardiovascular:  Antilipid - Statins 2 Failed - 08/30/2024 11:57 AM      Failed - Lipid Panel in normal range within the last 12 months    Cholesterol  Date Value Ref Range Status  08/19/2024 115 <200 mg/dL Final   LDL Cholesterol (Calc)  Date Value Ref Range Status  08/19/2024 40 mg/dL (calc) Final    Comment:    Reference range: <100 . Desirable range <100 mg/dL for primary prevention;   <70 mg/dL for patients with CHD or diabetic patients  with > or = 2 CHD risk factors. SABRA LDL-C is now calculated using the Martin-Hopkins  calculation, which is a validated novel method providing  better accuracy than the Friedewald equation in the  estimation of LDL-C.  Gladis APPLETHWAITE et al. SANDREA. 7986;689(80): 2061-2068  (http://education.[facility].com/faq/FAQ164)    Direct LDL  Date Value Ref Range Status  12/19/2012 146.6 mg/dL Final    Comment:    Optimal:  <100 mg/dLNear or Above Optimal:  100-129 mg/dLBorderline High:  130-159 mg/dLHigh:  160-189 mg/dLVery High:  >190 mg/dL   HDL  Date Value Ref Range Status  08/19/2024 57 > OR = 50 mg/dL Final   Triglycerides  Date Value Ref Range Status  08/19/2024 101 <150 mg/dL Final         Passed - Cr in normal range and within 360 days    Creat  Date Value Ref Range Status  08/19/2024 1.03 0.50 - 1.05 mg/dL Final         Passed - Patient is not pregnant      Passed - Valid encounter within last 12 months    Recent Outpatient Visits           3 days ago Annual physical exam   [place] [facility] [place] Marsa PARAS, DO   5 months ago Urinary hesitancy   [place] [facility]  [place], Angeline ORN, NP   8 months ago Acute cystitis without hematuria   [place] [facility] [place], Marsa PARAS, DO   9 months ago Gastroesophageal reflux disease without esophagitis   [place] [facility] [place], Marsa PARAS, DO               busPIRone  (BUSPAR ) 5 MG tablet [Pharmacy Med Name: BUSPIRONE  HYDROCHLORIDE 5 MG Oral Tablet] 270 tablet 3    Sig: TAKE 1 TABLET THREE TIMES DAILY AS NEEDED FOR ANXIETY     Psychiatry: Anxiolytics/Hypnotics - Non-controlled Passed - 08/30/2024 11:57 AM      Passed - Valid encounter within last 12 months    Recent Outpatient Visits           3 days ago Annual physical exam   [place] [facility] [place] Marsa PARAS, DO   5 months ago Urinary hesitancy   [place] [facility] [place], [place] W, NP   8 months ago Acute cystitis without hematuria   [place] [facility] [place] Marsa PARAS, DO   9 months ago Gastroesophageal reflux disease without esophagitis   [place] [facility] [place], Marsa PARAS, [place]

## 2024-09-07 ENCOUNTER — Other Ambulatory Visit: Payer: Self-pay | Admitting: Family Medicine

## 2024-09-07 DIAGNOSIS — K219 Gastro-esophageal reflux disease without esophagitis: Secondary | ICD-10-CM

## 2024-09-07 DIAGNOSIS — R112 Nausea with vomiting, unspecified: Secondary | ICD-10-CM

## 2024-09-10 NOTE — Telephone Encounter (Signed)
 Receipt confirmed by [facility] mail delivery pharmacy 08/27/24 at 1:49 pm. Request for refill from other pharmacy [facility]. Call pharmacy to verify if Rx has been sent .

## 2024-09-11 NOTE — Telephone Encounter (Signed)
 Requested medication (s) are due for refill today: no  Requested medication (s) are on the active medication list: yes  Last refill:  08/27/24  Future visit scheduled: {Yes  Notes to clinic:   Patient contacted to verify correct pharmacy     Requested Prescriptions  Pending Prescriptions Disp Refills   pantoprazole  (PROTONIX ) 40 MG tablet [Pharmacy Med Name: PANTOPRAZOLE  40MG  TABLETS] 30 tablet     Sig: TAKE 1 TABLET(40 MG) BY MOUTH DAILY BEFORE BREAKFAST     Gastroenterology: Proton Pump Inhibitors Passed - 09/11/2024 10:28 AM      Passed - Valid encounter within last 12 months    Recent Outpatient Visits           2 weeks ago Annual physical exam   [place] [facility] Edman Marsa PARAS, DO   5 months ago Urinary hesitancy   [place] [facility] [place], Angeline ORN, NP   9 months ago Acute cystitis without hematuria   [place] [facility] Edman Marsa PARAS, DO   9 months ago Gastroesophageal reflux disease without esophagitis   [place] [facility] [place], Marsa PARAS, [place]

## 2024-09-11 NOTE — Telephone Encounter (Signed)
 Attempted to contact patient to verify which pharmacy she is currently using. Left message to call back to verify Rx needed.

## 2024-09-12 ENCOUNTER — Other Ambulatory Visit: Payer: Self-pay

## 2024-09-12 DIAGNOSIS — R112 Nausea with vomiting, unspecified: Secondary | ICD-10-CM

## 2024-09-12 DIAGNOSIS — K219 Gastro-esophageal reflux disease without esophagitis: Secondary | ICD-10-CM

## 2024-09-12 MED ORDER — PANTOPRAZOLE SODIUM 40 MG PO TBEC: 40.0000 mg | DELAYED_RELEASE_TABLET | Freq: Every day | ORAL | 3 refills | Status: AC

## 2024-09-13 ENCOUNTER — Other Ambulatory Visit: Payer: Self-pay | Admitting: Family Medicine

## 2024-09-13 DIAGNOSIS — F3341 Major depressive disorder, recurrent, in partial remission: Secondary | ICD-10-CM

## 2024-09-16 NOTE — Telephone Encounter (Signed)
 Requested Prescriptions  Pending Prescriptions Disp Refills   venlafaxine  XR (EFFEXOR -XR) 150 MG 24 hr capsule [Pharmacy Med Name: VENLAFAXINE  HYDROCHLORIDEER 150 MG Oral Capsule Extended Release 24 Hour] 90 capsule 1    Sig: TAKE 1 CAPSULE EVERY DAY AT 6AM     Psychiatry: Antidepressants - SNRI - desvenlafaxine & venlafaxine  Failed - 09/16/2024 10:05 AM      Failed - Lipid Panel in normal range within the last 12 months    Cholesterol  Date Value Ref Range Status  08/19/2024 115 <200 mg/dL Final   LDL Cholesterol (Calc)  Date Value Ref Range Status  08/19/2024 40 mg/dL (calc) Final    Comment:    Reference range: <100 . Desirable range <100 mg/dL for primary prevention;   <70 mg/dL for patients with CHD or diabetic patients  with > or = 2 CHD risk factors. SABRA LDL-C is now calculated using the Martin-Hopkins  calculation, which is a validated novel method providing  better accuracy than the Friedewald equation in the  estimation of LDL-C.  Gladis APPLETHWAITE et al. SANDREA. 7986;689(80): 2061-2068  (http://education.[employer].com/faq/FAQ164)    Direct LDL  Date Value Ref Range Status  12/19/2012 146.6 mg/dL Final    Comment:    Optimal:  <100 mg/dLNear or Above Optimal:  100-129 mg/dLBorderline High:  130-159 mg/dLHigh:  160-189 mg/dLVery High:  >190 mg/dL   HDL  Date Value Ref Range Status  08/19/2024 57 > OR = 50 mg/dL Final   Triglycerides  Date Value Ref Range Status  08/19/2024 101 <150 mg/dL Final         Passed - Cr in normal range and within 360 days    Creat  Date Value Ref Range Status  08/19/2024 1.03 0.50 - 1.05 mg/dL Final         Passed - Completed PHQ-2 or PHQ-9 in the last 360 days      Passed - Last BP in normal range    BP Readings from Last 1 Encounters:  08/27/24 124/80         Passed - Valid encounter within last 6 months    Recent Outpatient Visits           2 weeks ago Annual physical exam   [place] [facility]  [place] Marsa PARAS, DO   5 months ago Urinary hesitancy   [place] [facility] [place], Angeline ORN, NP   9 months ago Acute cystitis without hematuria   [place] [facility] Edman Marsa PARAS, DO   9 months ago Gastroesophageal reflux disease without esophagitis    [facility] [place], Marsa PARAS, [place]

## 2024-09-26 ENCOUNTER — Other Ambulatory Visit: Payer: Self-pay | Admitting: Family Medicine

## 2024-09-26 DIAGNOSIS — K582 Mixed irritable bowel syndrome: Secondary | ICD-10-CM

## 2024-09-26 DIAGNOSIS — R112 Nausea with vomiting, unspecified: Secondary | ICD-10-CM

## 2024-09-30 NOTE — Telephone Encounter (Signed)
 Requested medication (s) are due for refill today: yes  Requested medication (s) are on the active medication list: yes  Last refill:  08/27/24  Future visit scheduled: {Yes  Notes to clinic:  Unable to refill per protocol, cannot delegate.      Requested Prescriptions  Pending Prescriptions Disp Refills   ondansetron  (ZOFRAN -ODT) 4 MG disintegrating tablet [Pharmacy Med Name: ONDANSETRON  ODT 4MG  TABLETS] 30 tablet 0    Sig: DISSOLVE 1 TABLET(4 MG) ON THE TONGUE EVERY 8 HOURS AS NEEDED FOR NAUSEA OR VOMITING     Not Delegated - Gastroenterology: Antiemetics - ondansetron  Failed - 09/30/2024  2:24 PM      Failed - This refill cannot be delegated      Passed - AST in normal range and within 360 days    AST  Date Value Ref Range Status  08/19/2024 12 10 - 35 U/L Final         Passed - ALT in normal range and within 360 days    ALT  Date Value Ref Range Status  08/19/2024 7 6 - 29 U/L Final         Passed - Valid encounter within last 6 months    Recent Outpatient Visits           1 month ago Annual physical exam   [place] [facility] Ed[facility] Marsa PARAS, DO   6 months ago Urinary hesitancy   [place] [facility] [place], [place] W, NP   9 months ago Acute cystitis without hematuria   [place] [facility] [place], Marsa PARAS, DO   10 months ago Gastroesophageal reflux disease without esophagitis   [place] [facility] [place], Marsa PARAS, DO               dicyclomine  (BENTYL ) 10 MG capsule [Pharmacy Med Name: DICYCLOMINE  10MG  CAPSULES] 60 capsule 0    Sig: TAKE 1 CAPSULE(10 MG) BY MOUTH FOUR TIMES DAILY BEFORE MEALS AND AT BEDTIME AS NEEDED FOR IRRITABLE BOWEL SYNDROME OR SYMPTOMS     Gastroenterology:  Antispasmodic Agents Passed - 09/30/2024  2:24 PM      Passed - Valid encounter within last 12 months    Recent Outpatient Visits           1 month ago Annual physical  exam   [place] [facility] Ed[place] Marsa PARAS, DO   6 months ago Urinary hesitancy   [place] [facility] [place], [place] W, NP   9 months ago Acute cystitis without hematuria   [place] [facility] Ed[place] Marsa PARAS, DO   10 months ago Gastroesophageal reflux disease without esophagitis   [place] [facility] [place], Marsa PARAS, [place]

## 2024-10-03 ENCOUNTER — Telehealth: Admitting: Family Medicine

## 2024-10-03 ENCOUNTER — Encounter: Payer: Self-pay | Admitting: Family Medicine

## 2024-10-03 DIAGNOSIS — N3000 Acute cystitis without hematuria: Secondary | ICD-10-CM

## 2024-10-03 MED ORDER — SULFAMETHOXAZOLE-TRIMETHOPRIM 800-160 MG PO TABS: 1.0000 | ORAL_TABLET | Freq: Two times a day (BID) | ORAL | 0 refills | Status: AC

## 2024-10-03 NOTE — Patient Instructions (Addendum)
 1. You have a Urinary Tract Infection - this is very common, your symptoms are reassuring and you should get better within 1 week on the antibiotics - Start Bactrim  2 times daily for next 7 days, complete entire course, even if feeling better - We sent urine for a culture, we will call you within next few days if we need to change antibiotics - Please drink plenty of fluids, improve hydration over next 1 week  If symptoms worsening, developing nausea / vomiting, worsening back pain, fevers / chills / sweats, then please return for re-evaluation sooner.  If you take AZO OTC - limit this to 2-3 days MAX to avoid affecting kidneys  D-Mannose is a natural supplement that can actually help bind to urinary bacteria and reduce their effectiveness it can help prevent UTI from forming, and may reduce some symptoms. It likely cannot cure an active UTI but it is worth a try and good to prevent them with. Try 500mg  twice a day at a full dose if you want, or check package instructions for more info   Please schedule a Follow-up Appointment to: Return if symptoms worsen or fail to improve.  If you have any other questions or concerns, please feel free to call the office or send a message through MyChart. You may also schedule an earlier appointment if necessary.  Additionally, you may be receiving a survey about your experience at our office within a few days to 1 week by e-mail or mail. We value your feedback.  Marsa Officer, DO [facility], [place]

## 2024-10-03 NOTE — Progress Notes (Addendum)
 "  Subjective:    Patient ID: [name], female    DOB: 1956-01-25, 68 y.o.   MRN: [id number]  [name] is a 68 y.o. female presenting on 10/03/2024 for Urinary Tract Infection  Patient presents for a same day appointment.  Virtual / Telehealth Encounter - Video Visit via MyChart The purpose of this virtual visit is to provide medical care while limiting exposure to the novel coronavirus (COVID19) for both patient and office staff.  Consent was obtained for remote visit:  Yes.   Answered questions that patient had about telehealth interaction:  Yes.   I discussed the limitations, risks, security and privacy concerns of performing an evaluation and management service by video/telephone. I also discussed with the patient that there may be a patient responsible charge related to this service. The patient expressed understanding and agreed to proceed.  Patient Location: Home Provider Location: [facility] (Office)  Participants in virtual visit: - Patient: [name] - CMA: Alan Fontana CMA - Provider: Dr Edman     HPI  Discussed the use of AI scribe software for clinical note transcription with the patient, who gave verbal consent to proceed.  History of Present Illness   [name] is a 68 year old female with recurrent urinary tract infections who presents with urinary symptoms.  Dysuria and urinary urgency - Symptoms similar to prior UTI - Piercing sensation and frequent urge to urinate, often without passage of urine - Sharp pain with urination - Symptoms are similar to previous bladder infections - No visible blood in urine  Current and prior treatments - Currently taking Azo for burning, which causes urine to appear orangish or reddish and makes it difficult to detect blood - Last documented urinary tract infection in July, treated with Bactrim  with good effect - Previously followed by Urology [facility]  04/2024  Antibiotic-associated complications - No history of yeast infections following antibiotic use           08/27/2024    1:41 PM 03/31/2024   10:08 AM 01/04/2024    4:02 PM  Depression screen PHQ 2/9  Decreased Interest 1 2 0  Down, Depressed, Hopeless 2 2 1   PHQ - 2 Score 3 4 1   Altered sleeping 2 2 0  Tired, decreased energy 2  1  Change in appetite 2  0  Feeling bad or failure about yourself  1  0  Trouble concentrating 0  0  Moving slowly or fidgety/restless 0  0  Suicidal thoughts 0  0  PHQ-9 Score 10 6  2    Difficult doing work/chores Not difficult at all  Not difficult at all     Data saved with a previous flowsheet row definition       08/27/2024    1:41 PM 03/31/2024   10:08 AM 12/10/2023    2:15 PM 10/23/2023    3:38 PM  GAD 7 : Generalized Anxiety Score  Nervous, Anxious, on Edge 1  2 2   Control/stop worrying 2  1 3   Worry too much - different things 2  1 3   Trouble relaxing 2  1 3   Restless 2  0 2  Easily annoyed or irritable 2  1 2   Afraid - awful might happen 2  1 3   Total GAD 7 Score 13  7 18   Anxiety Difficulty Somewhat difficult Not difficult at all Somewhat difficult Very difficult    Social History[1]  Review of Systems Per HPI unless specifically  indicated above     Objective:    There were no vitals taken for this visit.  Wt Readings from Last 3 Encounters:  08/27/24 189 lb 8 oz (86 kg)  05/08/24 208 lb 8 oz (94.6 kg)  05/01/24 209 lb 3.2 oz (94.9 kg)    Physical Exam  Note examination was completely remotely via video observation objective data only  Unable to get video to work, however we connected w/ audio only   Results for orders placed or performed in visit on 08/19/24  TSH   Collection Time: 08/19/24  9:07 AM  Result Value Ref Range   TSH 2.07 0.40 - 4.50 mIU/L  Lipid panel   Collection Time: 08/19/24  9:07 AM  Result Value Ref Range   Cholesterol 115 <200 mg/dL   HDL 57 > OR = 50 mg/dL   Triglycerides 898 <849  mg/dL   LDL Cholesterol (Calc) 40 mg/dL (calc)   Total CHOL/HDL Ratio 2.0 <5.0 (calc)   Non-HDL Cholesterol (Calc) 58 <869 mg/dL (calc)  Hemoglobin J8r   Collection Time: 08/19/24  9:07 AM  Result Value Ref Range   Hgb A1c MFr Bld 5.0 <5.7 %   Mean Plasma Glucose 97 mg/dL   eAG (mmol/L) 5.4 mmol/L  CBC with Differential/Platelet   Collection Time: 08/19/24  9:07 AM  Result Value Ref Range   WBC 7.9 3.8 - 10.8 Thousand/uL   RBC 4.16 3.80 - 5.10 Million/uL   Hemoglobin 13.4 11.7 - 15.5 g/dL   HCT 58.6 64.9 - 54.9 %   MCV 99.3 80.0 - 100.0 fL   MCH 32.2 27.0 - 33.0 pg   MCHC 32.4 32.0 - 36.0 g/dL   RDW 87.2 88.9 - 84.9 %   Platelets 309 140 - 400 Thousand/uL   MPV 9.4 7.5 - 12.5 fL   Neutro Abs 3,863 1,500 - 7,800 cells/uL   Absolute Lymphocytes 3,128 850 - 3,900 cells/uL   Absolute Monocytes 585 200 - 950 cells/uL   Eosinophils Absolute 253 15 - 500 cells/uL   Basophils Absolute 71 0 - 200 cells/uL   Neutrophils Relative % 48.9 %   Total Lymphocyte 39.6 %   Monocytes Relative 7.4 %   Eosinophils Relative 3.2 %   Basophils Relative 0.9 %  Comprehensive metabolic panel with GFR   Collection Time: 08/19/24  9:07 AM  Result Value Ref Range   Glucose, Bld 88 65 - 99 mg/dL   BUN 12 7 - 25 mg/dL   Creat 8.96 9.49 - 8.94 mg/dL   eGFR 60 > OR = 60 fO/fpw/8.26f7   BUN/Creatinine Ratio SEE NOTE: 6 - 22 (calc)   Sodium 142 135 - 146 mmol/L   Potassium 4.8 3.5 - 5.3 mmol/L   Chloride 106 98 - 110 mmol/L   CO2 29 20 - 32 mmol/L   Calcium  9.2 8.6 - 10.4 mg/dL   Total Protein 5.7 (L) 6.1 - 8.1 g/dL   Albumin 3.8 3.6 - 5.1 g/dL   Globulin 1.9 1.9 - 3.7 g/dL (calc)   AG Ratio 2.0 1.0 - 2.5 (calc)   Total Bilirubin 0.3 0.2 - 1.2 mg/dL   Alkaline phosphatase (APISO) 64 37 - 153 U/L   AST 12 10 - 35 U/L   ALT 7 6 - 29 U/L  VITAMIN D  25 Hydroxy (Vit-D Deficiency, Fractures)   Collection Time: 08/19/24  9:07 AM  Result Value Ref Range   Vit D, 25-Hydroxy 69 30 - 100 ng/mL  Vitamin  B12   Collection  Time: 08/19/24  9:07 AM  Result Value Ref Range   Vitamin B-12 681 200 - 1,100 pg/mL      Assessment & Plan:   Problem List Items Addressed This Visit   None Visit Diagnoses       Acute cystitis without hematuria    -  Primary   Relevant Medications   sulfamethoxazole -trimethoprim  (BACTRIM  DS) 800-160 MG tablet      Acute cystitis History of recurrent UTI, last UTI was July-August 2025, has seen urology in past and we have successfully treated w/ Bactrim  in past - Currently with acute onset acute cystitis with dysuria, frequency, and suprapubic pain. Symptoms consistent with bladder infection. No hematuria. No history of antibiotic-induced yeast infections.  Patient came by office today but unable to void and leave urinary sample. No collection completed   - Prescribed Bactrim  twice daily. X 7 days as this was successful previously. - Sent prescription to [facility]. - Counseling on recommendation to LIMIT use of AZO OTC to avoid nephrotoxic side effect of prolong use, limit to max duration 2-3 days - Consider re-evaluation and alternative treatment if no improvement.        No orders of the defined types were placed in this encounter.   Meds ordered this encounter  Medications   sulfamethoxazole -trimethoprim  (BACTRIM  DS) 800-160 MG tablet    Sig: Take 1 tablet by mouth 2 (two) times daily for 7 days.    Dispense:  14 tablet    Refill:  0    Follow up plan: Return if symptoms worsen or fail to improve.   Patient verbalizes understanding with the above medical recommendations including the limitation of remote medical advice.  Specific follow-up and call-back criteria were given for patient to follow-up or seek medical care more urgently if needed.  Total duration of direct patient care provided via telephone: 10 minutes   Marsa Officer, DO [facility] 10/03/2024, 4:24 PM     [1]  Social  History Tobacco Use   Smoking status: Former    Current packs/day: 0.00    Average packs/day: 0.3 packs/day for 5.0 years (1.3 ttl pk-yrs)    Types: Cigarettes    Start date: 07/25/1996    Quit date: 07/25/2001    Years since quitting: 23.2    Passive exposure: Past   Smokeless tobacco: Never  Vaping Use   Vaping status: Never Used  Substance Use Topics   Alcohol use: Yes    Alcohol/week: 0.0 standard drinks of alcohol    Comment: 2 glasses of wine per week   Drug use: No   "

## 2025-01-09 ENCOUNTER — Encounter

## 2025-01-14 ENCOUNTER — Encounter

## 2025-05-13 ENCOUNTER — Ambulatory Visit: Admitting: Physician Assistant

## 2025-08-20 ENCOUNTER — Other Ambulatory Visit

## 2025-08-28 ENCOUNTER — Encounter: Admitting: Family Medicine
